# Patient Record
Sex: Male | Born: 1960 | Race: White | Hispanic: No | Smoking: Former smoker
Health system: Southern US, Community
[De-identification: ages and names within clinical notes are randomized; demographics above are authoritative.]

## PROBLEM LIST (undated history)

## (undated) DIAGNOSIS — K37 Unspecified appendicitis: Secondary | ICD-10-CM

## (undated) DIAGNOSIS — F419 Anxiety disorder, unspecified: Secondary | ICD-10-CM

## (undated) DIAGNOSIS — K219 Gastro-esophageal reflux disease without esophagitis: Secondary | ICD-10-CM

## (undated) DIAGNOSIS — G4700 Insomnia, unspecified: Secondary | ICD-10-CM

## (undated) DIAGNOSIS — J40 Bronchitis, not specified as acute or chronic: Secondary | ICD-10-CM

## (undated) DIAGNOSIS — K432 Incisional hernia without obstruction or gangrene: Secondary | ICD-10-CM

## (undated) HISTORY — PX: HERNIA REPAIR: SHX51

## (undated) HISTORY — DX: Insomnia, unspecified: G47.00

## (undated) HISTORY — DX: Unspecified appendicitis: K37

## (undated) HISTORY — DX: Incisional hernia without obstruction or gangrene: K43.2

---

## 2006-03-01 ENCOUNTER — Emergency Department (HOSPITAL_COMMUNITY): Admission: EM | Admit: 2006-03-01 | Discharge: 2006-03-01 | Payer: Self-pay | Admitting: Emergency Medicine

## 2006-05-09 ENCOUNTER — Encounter: Payer: Self-pay | Admitting: Pulmonary Disease

## 2007-08-20 ENCOUNTER — Ambulatory Visit: Payer: Self-pay | Admitting: Pulmonary Disease

## 2007-08-20 DIAGNOSIS — J45909 Unspecified asthma, uncomplicated: Secondary | ICD-10-CM | POA: Insufficient documentation

## 2007-08-20 DIAGNOSIS — F411 Generalized anxiety disorder: Secondary | ICD-10-CM

## 2007-08-20 DIAGNOSIS — R059 Cough, unspecified: Secondary | ICD-10-CM | POA: Insufficient documentation

## 2007-08-20 DIAGNOSIS — R0602 Shortness of breath: Secondary | ICD-10-CM

## 2007-08-20 DIAGNOSIS — K219 Gastro-esophageal reflux disease without esophagitis: Secondary | ICD-10-CM

## 2007-08-20 DIAGNOSIS — R05 Cough: Secondary | ICD-10-CM

## 2007-08-20 DIAGNOSIS — F419 Anxiety disorder, unspecified: Secondary | ICD-10-CM | POA: Insufficient documentation

## 2010-06-11 ENCOUNTER — Emergency Department (HOSPITAL_COMMUNITY)
Admission: EM | Admit: 2010-06-11 | Discharge: 2010-06-11 | Payer: Self-pay | Source: Home / Self Care | Admitting: Emergency Medicine

## 2013-04-14 ENCOUNTER — Encounter (HOSPITAL_COMMUNITY): Payer: Self-pay | Admitting: Emergency Medicine

## 2013-04-14 ENCOUNTER — Emergency Department (HOSPITAL_COMMUNITY): Payer: Medicare Other

## 2013-04-14 ENCOUNTER — Emergency Department (HOSPITAL_COMMUNITY)
Admission: EM | Admit: 2013-04-14 | Discharge: 2013-04-14 | Disposition: A | Discharge status: Home / Self Care | Payer: Medicare Other | Attending: Emergency Medicine | Admitting: Emergency Medicine

## 2013-04-14 DIAGNOSIS — M6208 Separation of muscle (nontraumatic), other site: Secondary | ICD-10-CM

## 2013-04-14 DIAGNOSIS — Z8719 Personal history of other diseases of the digestive system: Secondary | ICD-10-CM | POA: Insufficient documentation

## 2013-04-14 DIAGNOSIS — R197 Diarrhea, unspecified: Secondary | ICD-10-CM | POA: Insufficient documentation

## 2013-04-14 DIAGNOSIS — R062 Wheezing: Secondary | ICD-10-CM | POA: Insufficient documentation

## 2013-04-14 DIAGNOSIS — F10239 Alcohol dependence with withdrawal, unspecified: Secondary | ICD-10-CM | POA: Insufficient documentation

## 2013-04-14 DIAGNOSIS — Z79899 Other long term (current) drug therapy: Secondary | ICD-10-CM | POA: Insufficient documentation

## 2013-04-14 DIAGNOSIS — R112 Nausea with vomiting, unspecified: Secondary | ICD-10-CM

## 2013-04-14 DIAGNOSIS — F17200 Nicotine dependence, unspecified, uncomplicated: Secondary | ICD-10-CM | POA: Insufficient documentation

## 2013-04-14 DIAGNOSIS — F10939 Alcohol use, unspecified with withdrawal, unspecified: Secondary | ICD-10-CM

## 2013-04-14 DIAGNOSIS — F411 Generalized anxiety disorder: Secondary | ICD-10-CM | POA: Insufficient documentation

## 2013-04-14 DIAGNOSIS — Z8709 Personal history of other diseases of the respiratory system: Secondary | ICD-10-CM | POA: Insufficient documentation

## 2013-04-14 DIAGNOSIS — F419 Anxiety disorder, unspecified: Secondary | ICD-10-CM

## 2013-04-14 DIAGNOSIS — M6200 Separation of muscle (nontraumatic), unspecified site: Secondary | ICD-10-CM | POA: Insufficient documentation

## 2013-04-14 HISTORY — DX: Anxiety disorder, unspecified: F41.9

## 2013-04-14 HISTORY — DX: Gastro-esophageal reflux disease without esophagitis: K21.9

## 2013-04-14 HISTORY — DX: Bronchitis, not specified as acute or chronic: J40

## 2013-04-14 LAB — CBC
HCT: 43.9 % (ref 39.0–52.0)
Hemoglobin: 15 g/dL (ref 13.0–17.0)
MCH: 30.9 pg (ref 26.0–34.0)
MCHC: 34.2 g/dL (ref 30.0–36.0)
MCV: 90.3 fL (ref 78.0–100.0)
RBC: 4.86 MIL/uL (ref 4.22–5.81)

## 2013-04-14 LAB — LIPASE, BLOOD: Lipase: 32 U/L (ref 11–59)

## 2013-04-14 LAB — COMPREHENSIVE METABOLIC PANEL
ALT: 53 U/L (ref 0–53)
BUN: 9 mg/dL (ref 6–23)
CO2: 22 mEq/L (ref 19–32)
Calcium: 9.1 mg/dL (ref 8.4–10.5)
GFR calc Af Amer: >90 mL/min (ref >90)
GFR calc non Af Amer: >90 mL/min (ref >90)
Glucose, Bld: 110 mg/dL — ABNORMAL HIGH (ref 70–99)
Total Protein: 8.6 g/dL — ABNORMAL HIGH (ref 6.0–8.3)

## 2013-04-14 MED ORDER — LORAZEPAM 2 MG/ML IJ SOLN
1.0000 mg | Freq: Once | INTRAMUSCULAR | Status: AC
  Administered 2013-04-14: 1 mg via INTRAVENOUS

## 2013-04-14 MED ORDER — PANTOPRAZOLE SODIUM 40 MG IV SOLR
40.0000 mg | Freq: Once | INTRAVENOUS | Status: AC
  Administered 2013-04-14: 40 mg via INTRAVENOUS
  Filled 2013-04-14: qty 40

## 2013-04-14 MED ORDER — SODIUM CHLORIDE 0.9 % IV BOLUS (SEPSIS)
1000.0000 mL | Freq: Once | INTRAVENOUS | Status: AC
  Administered 2013-04-14: 1000 mL via INTRAVENOUS

## 2013-04-14 MED ORDER — ALBUTEROL SULFATE (2.5 MG/3ML) 0.083% IN NEBU: 5.0000 mg | INHALATION_SOLUTION | Freq: Four times a day (QID) | RESPIRATORY_TRACT | Status: DC | PRN

## 2013-04-14 MED ORDER — LORAZEPAM 2 MG/ML IJ SOLN
1.0000 mg | Freq: Once | INTRAMUSCULAR | Status: AC
  Administered 2013-04-14: 1 mg via INTRAVENOUS
  Filled 2013-04-14: qty 1

## 2013-04-14 MED ORDER — LORAZEPAM 1 MG PO TABS: 1.0000 mg | ORAL_TABLET | Freq: Three times a day (TID) | ORAL | Status: DC | PRN

## 2013-04-14 MED ORDER — ALBUTEROL SULFATE HFA 108 (90 BASE) MCG/ACT IN AERS: 2.0000 | INHALATION_SPRAY | RESPIRATORY_TRACT | Status: DC | PRN

## 2013-04-14 MED ORDER — ONDANSETRON HCL 8 MG PO TABS: 8.0000 mg | ORAL_TABLET | Freq: Three times a day (TID) | ORAL | Status: DC | PRN

## 2013-04-14 MED ORDER — ONDANSETRON HCL 4 MG/2ML IJ SOLN
4.0000 mg | Freq: Once | INTRAMUSCULAR | Status: AC
  Administered 2013-04-14: 4 mg via INTRAVENOUS

## 2013-04-14 MED ORDER — ALBUTEROL SULFATE (5 MG/ML) 0.5% IN NEBU
5.0000 mg | INHALATION_SOLUTION | Freq: Once | RESPIRATORY_TRACT | Status: AC
  Administered 2013-04-14: 5 mg via RESPIRATORY_TRACT
  Filled 2013-04-14: qty 1

## 2013-04-14 MED ORDER — MORPHINE SULFATE 4 MG/ML IJ SOLN
4.0000 mg | Freq: Once | INTRAMUSCULAR | Status: AC
  Administered 2013-04-14: 4 mg via INTRAVENOUS

## 2013-04-14 MED ORDER — ONDANSETRON HCL 4 MG/2ML IJ SOLN
INTRAMUSCULAR | Status: AC
  Filled 2013-04-14: qty 2

## 2013-04-14 MED ORDER — MORPHINE SULFATE 4 MG/ML IJ SOLN
INTRAMUSCULAR | Status: AC
  Filled 2013-04-14: qty 1

## 2013-04-14 MED ORDER — ONDANSETRON HCL 4 MG/2ML IJ SOLN
4.0000 mg | Freq: Once | INTRAMUSCULAR | Status: AC
  Administered 2013-04-14: 4 mg via INTRAVENOUS
  Filled 2013-04-14: qty 2

## 2013-04-14 MED ORDER — LORAZEPAM 2 MG/ML IJ SOLN
INTRAMUSCULAR | Status: AC
  Filled 2013-04-14: qty 1

## 2013-04-14 NOTE — ED Notes (Addendum)
Pt reports today with a "lump in his stomach." Pt reports that the lump gets bigger and then will recede. Pt reports nausea and diarrhea, however denies emesis. Pt is A/O x4 and in NAD. Pt also reports depression after the death of his granddaughter from SIDS, pt states he has been drinking excessively and would like assistance in reducing his alcohol intake.

## 2013-04-14 NOTE — ED Provider Notes (Signed)
CSN: [id number]     Arrival date & time 04/14/13  1538 History   First MD Initiated Contact with Patient 04/14/13 1612     Chief Complaint  Patient presents with  . Abdominal Pain   (Consider location/radiation/quality/duration/timing/severity/associated sxs/prior Treatment) Patient is a 52 y.o. male presenting with abdominal pain. The history is provided by the patient.  Abdominal Pain Associated symptoms: diarrhea and nausea   Associated symptoms: no chest pain, no fever and no shortness of breath   pt with hx etoh abuse, presents c/o nvd for the past few days. States last drank 3 days ago, was drinking 6-8 beers per night prior to that. Notes hx anxiety. Denies severe depression or thoughts of self harm. w etoh abuse, will feel shaky when stops, but denies hx seizures or dts diarrhea watery, few episodes per day. Nausea. No bloody or bilious emesis. No constant or focal abd pain. No fever or chills. Also notes for hx years, midline abd 'pooching out' when strains or coughs. Goes down when lies down or isnt straining. Never becomes stuck out or painful. No current abd distension or pain     Past Medical History  Diagnosis Date  . Bronchitis   . GERD (gastroesophageal reflux disease)   . Anxiety    History reviewed. No pertinent past surgical history. No family history on file. History  Substance Use Topics  . Smoking status: Current Every Day Smoker -- 0.50 packs/day for 25 years    Types: Cigarettes  . Smokeless tobacco: Not on file  . Alcohol Use: 36.0 oz/week    60 Cans of beer per week    Review of Systems  Constitutional: Negative for fever.  Eyes: Negative for visual disturbance.  Respiratory: Negative for shortness of breath.   Cardiovascular: Negative for chest pain.  Gastrointestinal: Positive for nausea and diarrhea. Negative for blood in stool.  Genitourinary: Negative for flank pain.  Musculoskeletal: Negative for back pain and neck pain.  Skin: Negative for  rash.  Neurological: Negative for headaches.  Hematological: Does not bruise/bleed easily.  Psychiatric/Behavioral: Negative for confusion.    Allergies  Review of patient's allergies indicates no known allergies.  Home Medications   Current Outpatient Rx  Name  Route  Sig  Dispense  Refill  . albuterol (PROVENTIL HFA;VENTOLIN HFA) 108 (90 BASE) MCG/ACT inhaler   Inhalation   Inhale 2 puffs into the lungs every 6 (six) hours as needed for wheezing (prn).          BP 167/97  Pulse 113  Temp(Src) 98.8 F (37.1 C) (Oral)  Resp 18  SpO2 98% Physical Exam  Nursing note and vitals reviewed. Constitutional: He is oriented to person, place, and time. He appears well-developed and well-nourished. No distress.  HENT:  Mouth/Throat: Oropharynx is clear and moist.  Eyes: Conjunctivae are normal. Pupils are equal, round, and reactive to light. No scleral icterus.  Neck: Neck supple. No tracheal deviation present.  Cardiovascular: Normal rate, regular rhythm, normal heart sounds and intact distal pulses.   Pulmonary/Chest: Effort normal. No accessory muscle usage. No respiratory distress. He has wheezes.  Abdominal: Soft. Bowel sounds are normal. He exhibits no distension and no mass. There is no tenderness. There is no rebound and no guarding.  ?rectus diastasis w coughing, bearing down. No incarcerated hernia.   Genitourinary:  No cva tenderness  Musculoskeletal: Normal range of motion. He exhibits no edema and no tenderness.  Neurological: He is alert and oriented to person, place, and time.  Skin: Skin is warm and dry.  Psychiatric:  Mildly anxious.     ED Course  Procedures (including critical care time)  Results for orders placed during the hospital encounter of 04/14/13  CBC      Result Value Range   WBC 7.5  4.0 - 10.5 K/uL   RBC 4.86  4.22 - 5.81 MIL/uL   Hemoglobin 15.0  13.0 - 17.0 g/dL   HCT 96.0  45.4 - 09.8 %   MCV 90.3  78.0 - 100.0 fL   MCH 30.9  26.0 - 34.0  pg   MCHC 34.2  30.0 - 36.0 g/dL   RDW 11.9  14.7 - 82.9 %   Platelets 167  150 - 400 K/uL  COMPREHENSIVE METABOLIC PANEL      Result Value Range   Sodium 132 (*) 135 - 145 mEq/L   Potassium 3.5  3.5 - 5.1 mEq/L   Chloride 97  96 - 112 mEq/L   CO2 22  19 - 32 mEq/L   Glucose, Bld 110 (*) 70 - 99 mg/dL   BUN 9  6 - 23 mg/dL   Creatinine, Ser 5.62  0.50 - 1.35 mg/dL   Calcium 9.1  8.4 - 13.0 mg/dL   Total Protein 8.6 (*) 6.0 - 8.3 g/dL   Albumin 4.0  3.5 - 5.2 g/dL   AST 865 (*) 0 - 37 U/L   ALT 53  0 - 53 U/L   Alkaline Phosphatase 104  39 - 117 U/L   Total Bilirubin 1.1  0.3 - 1.2 mg/dL   GFR calc non Af Amer >90  >90 mL/min   GFR calc Af Amer >90  >90 mL/min  LIPASE, BLOOD      Result Value Range   Lipase 32  11 - 59 U/L  ETHANOL      Result Value Range   Alcohol, Ethyl (B) <11  0 - 11 mg/dL   Dg Chest 2 View  78/46/9629   CLINICAL DATA:  Cough, shortness of breath, smoker.  EXAM: CHEST  2 VIEW  COMPARISON:  08/20/2007.  Chest CTA dated 01/31/2007.  FINDINGS: The heart remains normal in size and the lungs are clear. No significant change in diffuse peribronchial thickening and accentuation of the interstitial markings. Old, healed right rib fractures. A previously demonstrated right upper lobe nodule is better visualized today with overlapping ribs. This has not changed significantly in size since the previous CTA. Unremarkable bones.  IMPRESSION: 1. Stable chronic bronchitic changes. 2. Stable right upper lobe nodule. The long-term stability is compatible with a benign process. 3. No acute abnormality.   Electronically Signed   By: Gordan Payment M.D.   On: 04/14/2013 17:04      EKG Interpretation   None       MDM  Iv ns bolus. zofran iv. protonix iv.   Reviewed nursing notes and prior charts for additional history.   Ativan 1 mg iv.   2nd liter ns.   No recurrent nvd in ed.  Pt requests med for nerves. No tremor or shakes noted.  Ativan 1 mg iv.  Recheck pt  feels much improved.  Pt appears stable for d/c.        Suzi Roots, MD 04/14/13 [phone]

## 2016-05-28 DIAGNOSIS — J449 Chronic obstructive pulmonary disease, unspecified: Secondary | ICD-10-CM | POA: Diagnosis not present

## 2016-05-28 DIAGNOSIS — R7989 Other specified abnormal findings of blood chemistry: Secondary | ICD-10-CM | POA: Diagnosis not present

## 2016-05-28 DIAGNOSIS — R0781 Pleurodynia: Secondary | ICD-10-CM | POA: Diagnosis not present

## 2016-05-28 DIAGNOSIS — R1010 Upper abdominal pain, unspecified: Secondary | ICD-10-CM | POA: Diagnosis not present

## 2016-05-28 DIAGNOSIS — D649 Anemia, unspecified: Secondary | ICD-10-CM | POA: Diagnosis not present

## 2016-05-28 DIAGNOSIS — R945 Abnormal results of liver function studies: Secondary | ICD-10-CM | POA: Diagnosis not present

## 2016-05-28 DIAGNOSIS — R911 Solitary pulmonary nodule: Secondary | ICD-10-CM | POA: Diagnosis not present

## 2016-05-28 DIAGNOSIS — S2232XA Fracture of one rib, left side, initial encounter for closed fracture: Secondary | ICD-10-CM | POA: Diagnosis not present

## 2016-05-28 DIAGNOSIS — F1010 Alcohol abuse, uncomplicated: Secondary | ICD-10-CM | POA: Diagnosis not present

## 2016-05-28 DIAGNOSIS — R1012 Left upper quadrant pain: Secondary | ICD-10-CM | POA: Diagnosis not present

## 2016-05-28 DIAGNOSIS — S2231XD Fracture of one rib, right side, subsequent encounter for fracture with routine healing: Secondary | ICD-10-CM | POA: Diagnosis not present

## 2016-05-28 DIAGNOSIS — S3991XA Unspecified injury of abdomen, initial encounter: Secondary | ICD-10-CM | POA: Diagnosis not present

## 2016-05-28 DIAGNOSIS — F10129 Alcohol abuse with intoxication, unspecified: Secondary | ICD-10-CM | POA: Diagnosis not present

## 2016-05-28 DIAGNOSIS — K7460 Unspecified cirrhosis of liver: Secondary | ICD-10-CM | POA: Diagnosis not present

## 2016-05-28 DIAGNOSIS — K766 Portal hypertension: Secondary | ICD-10-CM | POA: Diagnosis not present

## 2016-05-28 DIAGNOSIS — R935 Abnormal findings on diagnostic imaging of other abdominal regions, including retroperitoneum: Secondary | ICD-10-CM | POA: Diagnosis not present

## 2016-05-28 DIAGNOSIS — Z79899 Other long term (current) drug therapy: Secondary | ICD-10-CM | POA: Diagnosis not present

## 2017-07-19 DIAGNOSIS — H3561 Retinal hemorrhage, right eye: Secondary | ICD-10-CM | POA: Diagnosis not present

## 2017-07-19 DIAGNOSIS — H04123 Dry eye syndrome of bilateral lacrimal glands: Secondary | ICD-10-CM | POA: Diagnosis not present

## 2017-07-19 DIAGNOSIS — H25813 Combined forms of age-related cataract, bilateral: Secondary | ICD-10-CM | POA: Diagnosis not present

## 2017-09-05 DIAGNOSIS — H2512 Age-related nuclear cataract, left eye: Secondary | ICD-10-CM | POA: Diagnosis not present

## 2017-09-18 DIAGNOSIS — H268 Other specified cataract: Secondary | ICD-10-CM | POA: Diagnosis not present

## 2017-09-18 DIAGNOSIS — H25812 Combined forms of age-related cataract, left eye: Secondary | ICD-10-CM | POA: Diagnosis not present

## 2017-09-18 DIAGNOSIS — H2512 Age-related nuclear cataract, left eye: Secondary | ICD-10-CM | POA: Diagnosis not present

## 2017-09-18 DIAGNOSIS — H2181 Floppy iris syndrome: Secondary | ICD-10-CM | POA: Diagnosis not present

## 2019-01-21 ENCOUNTER — Telehealth: Payer: Self-pay

## 2019-01-21 NOTE — Telephone Encounter (Signed)
Copied from [place] [id number]. Topic: Appointment Scheduling - Scheduling Inquiry for Clinic >> Jan 21, 2019  2:34 PM Sheran Luz wrote: Patient would like to know if Dr. Larose Kells would accept him as a new patient. His step father is a current patient of Dr. Larose Kells.

## 2019-01-22 NOTE — Telephone Encounter (Signed)
Okay to schedule NP appt.  

## 2019-01-22 NOTE — Telephone Encounter (Signed)
Ok to accept?

## 2019-01-22 NOTE — Telephone Encounter (Signed)
Done

## 2019-01-24 ENCOUNTER — Emergency Department (HOSPITAL_BASED_OUTPATIENT_CLINIC_OR_DEPARTMENT_OTHER): Payer: PPO

## 2019-01-24 ENCOUNTER — Encounter: Payer: Self-pay | Admitting: Internal Medicine

## 2019-01-24 ENCOUNTER — Ambulatory Visit (INDEPENDENT_AMBULATORY_CARE_PROVIDER_SITE_OTHER): Payer: PPO | Admitting: Internal Medicine

## 2019-01-24 ENCOUNTER — Inpatient Hospital Stay (HOSPITAL_BASED_OUTPATIENT_CLINIC_OR_DEPARTMENT_OTHER)
Admission: EM | Admit: 2019-01-24 | Discharge: 2019-01-28 | DRG: 378 | Disposition: A | Discharge status: Home / Self Care | Payer: PPO | Source: Ambulatory Visit | Attending: Internal Medicine | Admitting: Internal Medicine

## 2019-01-24 ENCOUNTER — Other Ambulatory Visit: Payer: Self-pay

## 2019-01-24 ENCOUNTER — Encounter (HOSPITAL_BASED_OUTPATIENT_CLINIC_OR_DEPARTMENT_OTHER): Payer: Self-pay | Admitting: *Deleted

## 2019-01-24 VITALS — BP 157/86 | HR 122 | Temp 99.6°F | Resp 16 | Ht 72.0 in | Wt 228.0 lb

## 2019-01-24 DIAGNOSIS — D123 Benign neoplasm of transverse colon: Secondary | ICD-10-CM | POA: Diagnosis not present

## 2019-01-24 DIAGNOSIS — F10239 Alcohol dependence with withdrawal, unspecified: Secondary | ICD-10-CM | POA: Diagnosis present

## 2019-01-24 DIAGNOSIS — I429 Cardiomyopathy, unspecified: Secondary | ICD-10-CM | POA: Diagnosis not present

## 2019-01-24 DIAGNOSIS — K7460 Unspecified cirrhosis of liver: Secondary | ICD-10-CM | POA: Diagnosis not present

## 2019-01-24 DIAGNOSIS — I8510 Secondary esophageal varices without bleeding: Secondary | ICD-10-CM | POA: Diagnosis not present

## 2019-01-24 DIAGNOSIS — F17200 Nicotine dependence, unspecified, uncomplicated: Secondary | ICD-10-CM | POA: Diagnosis present

## 2019-01-24 DIAGNOSIS — D649 Anemia, unspecified: Secondary | ICD-10-CM | POA: Diagnosis not present

## 2019-01-24 DIAGNOSIS — D125 Benign neoplasm of sigmoid colon: Secondary | ICD-10-CM | POA: Diagnosis not present

## 2019-01-24 DIAGNOSIS — Z20828 Contact with and (suspected) exposure to other viral communicable diseases: Secondary | ICD-10-CM | POA: Diagnosis present

## 2019-01-24 DIAGNOSIS — R0602 Shortness of breath: Secondary | ICD-10-CM

## 2019-01-24 DIAGNOSIS — F17210 Nicotine dependence, cigarettes, uncomplicated: Secondary | ICD-10-CM | POA: Diagnosis present

## 2019-01-24 DIAGNOSIS — B1920 Unspecified viral hepatitis C without hepatic coma: Secondary | ICD-10-CM | POA: Diagnosis present

## 2019-01-24 DIAGNOSIS — D500 Iron deficiency anemia secondary to blood loss (chronic): Secondary | ICD-10-CM | POA: Diagnosis present

## 2019-01-24 DIAGNOSIS — K766 Portal hypertension: Secondary | ICD-10-CM | POA: Diagnosis not present

## 2019-01-24 DIAGNOSIS — K2951 Unspecified chronic gastritis with bleeding: Principal | ICD-10-CM | POA: Diagnosis present

## 2019-01-24 DIAGNOSIS — K2970 Gastritis, unspecified, without bleeding: Secondary | ICD-10-CM | POA: Diagnosis not present

## 2019-01-24 DIAGNOSIS — K635 Polyp of colon: Secondary | ICD-10-CM | POA: Diagnosis present

## 2019-01-24 DIAGNOSIS — K7031 Alcoholic cirrhosis of liver with ascites: Secondary | ICD-10-CM

## 2019-01-24 DIAGNOSIS — D122 Benign neoplasm of ascending colon: Secondary | ICD-10-CM | POA: Diagnosis not present

## 2019-01-24 DIAGNOSIS — F1020 Alcohol dependence, uncomplicated: Secondary | ICD-10-CM | POA: Diagnosis present

## 2019-01-24 DIAGNOSIS — K3189 Other diseases of stomach and duodenum: Secondary | ICD-10-CM | POA: Diagnosis not present

## 2019-01-24 DIAGNOSIS — R188 Other ascites: Secondary | ICD-10-CM

## 2019-01-24 DIAGNOSIS — E871 Hypo-osmolality and hyponatremia: Secondary | ICD-10-CM | POA: Diagnosis not present

## 2019-01-24 DIAGNOSIS — K2950 Unspecified chronic gastritis without bleeding: Secondary | ICD-10-CM | POA: Diagnosis not present

## 2019-01-24 DIAGNOSIS — E876 Hypokalemia: Secondary | ICD-10-CM | POA: Diagnosis present

## 2019-01-24 DIAGNOSIS — F419 Anxiety disorder, unspecified: Secondary | ICD-10-CM | POA: Diagnosis present

## 2019-01-24 DIAGNOSIS — K641 Second degree hemorrhoids: Secondary | ICD-10-CM | POA: Diagnosis present

## 2019-01-24 DIAGNOSIS — D120 Benign neoplasm of cecum: Secondary | ICD-10-CM | POA: Diagnosis not present

## 2019-01-24 DIAGNOSIS — D509 Iron deficiency anemia, unspecified: Secondary | ICD-10-CM | POA: Diagnosis not present

## 2019-01-24 DIAGNOSIS — R918 Other nonspecific abnormal finding of lung field: Secondary | ICD-10-CM | POA: Diagnosis not present

## 2019-01-24 DIAGNOSIS — F1029 Alcohol dependence with unspecified alcohol-induced disorder: Secondary | ICD-10-CM | POA: Diagnosis not present

## 2019-01-24 LAB — COMPREHENSIVE METABOLIC PANEL
ALT: 11 U/L (ref 0–44)
AST: 37 U/L (ref 15–41)
Albumin: 3 g/dL — ABNORMAL LOW (ref 3.5–5.0)
Alkaline Phosphatase: 90 U/L (ref 38–126)
Anion gap: 13 (ref 5–15)
BUN: 7 mg/dL (ref 6–20)
CO2: 25 mmol/L (ref 22–32)
Calcium: 7.7 mg/dL — ABNORMAL LOW (ref 8.9–10.3)
Chloride: 93 mmol/L — ABNORMAL LOW (ref 98–111)
Creatinine, Ser: 0.62 mg/dL (ref 0.61–1.24)
GFR calc Af Amer: >60 mL/min (ref >60)
GFR calc non Af Amer: >60 mL/min (ref >60)
Glucose, Bld: 106 mg/dL — ABNORMAL HIGH (ref 70–99)
Potassium: 3 mmol/L — ABNORMAL LOW (ref 3.5–5.1)
Sodium: 131 mmol/L — ABNORMAL LOW (ref 135–145)
Total Bilirubin: 1.6 mg/dL — ABNORMAL HIGH (ref 0.3–1.2)
Total Protein: 7.6 g/dL (ref 6.5–8.1)

## 2019-01-24 LAB — CBC WITH DIFFERENTIAL/PLATELET
Abs Immature Granulocytes: 0.01 10*3/uL (ref 0.00–0.07)
Basophils Absolute: 0 10*3/uL (ref 0.0–0.1)
Basophils Relative: 1 %
Eosinophils Absolute: 0 10*3/uL (ref 0.0–0.5)
Eosinophils Relative: 1 %
HCT: 24 % — ABNORMAL LOW (ref 39.0–52.0)
Hemoglobin: 6.2 g/dL — CL (ref 13.0–17.0)
Immature Granulocytes: 0 %
Lymphocytes Relative: 20 %
Lymphs Abs: 0.8 10*3/uL (ref 0.7–4.0)
MCH: 17.4 pg — ABNORMAL LOW (ref 26.0–34.0)
MCHC: 25.8 g/dL — ABNORMAL LOW (ref 30.0–36.0)
MCV: 67.2 fL — ABNORMAL LOW (ref 80.0–100.0)
Monocytes Absolute: 0.6 10*3/uL (ref 0.1–1.0)
Monocytes Relative: 14 %
Neutro Abs: 2.5 10*3/uL (ref 1.7–7.7)
Neutrophils Relative %: 64 %
Platelets: 202 10*3/uL (ref 150–400)
RBC: 3.57 MIL/uL — ABNORMAL LOW (ref 4.22–5.81)
RDW: 19.4 % — ABNORMAL HIGH (ref 11.5–15.5)
WBC: 4 10*3/uL (ref 4.0–10.5)
nRBC: 0 % (ref 0.0–0.2)

## 2019-01-24 LAB — URINALYSIS, ROUTINE W REFLEX MICROSCOPIC
Glucose, UA: 100 mg/dL — AB
Hgb urine dipstick: NEGATIVE
Ketones, ur: 15 mg/dL — AB
Leukocytes,Ua: NEGATIVE
Nitrite: NEGATIVE
Protein, ur: 30 mg/dL — AB
Specific Gravity, Urine: 1.025 (ref 1.005–1.030)
pH: 6 (ref 5.0–8.0)

## 2019-01-24 LAB — RAPID URINE DRUG SCREEN, HOSP PERFORMED
Amphetamines: NOT DETECTED
Barbiturates: NOT DETECTED
Benzodiazepines: NOT DETECTED
Cocaine: NOT DETECTED
Opiates: NOT DETECTED
Tetrahydrocannabinol: NOT DETECTED

## 2019-01-24 LAB — ETHANOL: Alcohol, Ethyl (B): <10 mg/dL (ref <10)

## 2019-01-24 LAB — SARS CORONAVIRUS 2 BY RT PCR (HOSPITAL ORDER, PERFORMED IN ~~LOC~~ HOSPITAL LAB): SARS Coronavirus 2: NEGATIVE

## 2019-01-24 LAB — URINALYSIS, MICROSCOPIC (REFLEX)

## 2019-01-24 LAB — BRAIN NATRIURETIC PEPTIDE: B Natriuretic Peptide: 92.8 pg/mL (ref 0.0–100.0)

## 2019-01-24 LAB — LIPASE, BLOOD: Lipase: 35 U/L (ref 11–51)

## 2019-01-24 LAB — OCCULT BLOOD X 1 CARD TO LAB, STOOL: Fecal Occult Bld: NEGATIVE

## 2019-01-24 LAB — PROTIME-INR
INR: 1.3 — ABNORMAL HIGH (ref 0.8–1.2)
Prothrombin Time: 16.1 seconds — ABNORMAL HIGH (ref 11.4–15.2)

## 2019-01-24 LAB — AMMONIA: Ammonia: 15 umol/L (ref 9–35)

## 2019-01-24 MED ORDER — ALBUTEROL SULFATE HFA 108 (90 BASE) MCG/ACT IN AERS
2.0000 | INHALATION_SPRAY | RESPIRATORY_TRACT | Status: DC
  Administered 2019-01-24: 22:00:00 2 via RESPIRATORY_TRACT
  Filled 2019-01-24: qty 6.7

## 2019-01-24 MED ORDER — MORPHINE SULFATE (PF) 4 MG/ML IV SOLN
4.0000 mg | Freq: Once | INTRAVENOUS | Status: AC
  Administered 2019-01-24: 4 mg via INTRAVENOUS
  Filled 2019-01-24: qty 1

## 2019-01-24 MED ORDER — IOHEXOL 300 MG/ML  SOLN
100.0000 mL | Freq: Once | INTRAMUSCULAR | Status: AC
  Administered 2019-01-24: 100 mL via INTRAVENOUS

## 2019-01-24 MED ORDER — POTASSIUM CHLORIDE CRYS ER 20 MEQ PO TBCR
40.0000 meq | EXTENDED_RELEASE_TABLET | Freq: Once | ORAL | Status: AC
  Administered 2019-01-24: 40 meq via ORAL
  Filled 2019-01-24: qty 2

## 2019-01-24 NOTE — Patient Instructions (Signed)
Please proceed to the ER at the first floor.

## 2019-01-24 NOTE — Progress Notes (Signed)
Subjective:    Patient ID: William Whitaker, male    DOB: 02-22-1961, 58 y.o.   MRN: [id number]  DOS:  01/24/2019 Type of visit - description: New patient, acute visit. The patient is concerned because for the last 3 weeks, he has noted progressive lower extremity edema,bdominal swelling. He has shortness of breath but it is worse for the last month. He admits to heavy tobacco abuse, 3 pack a day, is only over the last year that he is smoking less. He also is a heavy drinker, he has "slowed down" lately and he only drinks 3 beers daily.   Review of Systems  Denies fever chills No chest pain No nausea, vomiting, diarrhea.  No blood in the stool. Occasional cough with whitish sputum, no hemoptysis.  Past Medical History:  Diagnosis Date  . Anxiety   . Bronchitis   . GERD (gastroesophageal reflux disease)   . Insomnia     Past Surgical History:  Procedure Laterality Date  . HERNIA REPAIR Right remotely as a baby    Social History   Socioeconomic History  . Marital status: Married    Spouse name: Not on file  . Number of children: Not on file  . Years of education: Not on file  . Highest education level: Not on file  Occupational History  . Occupation: disability d/t COPD  Social Needs  . Financial resource strain: Not on file  . Food insecurity    Worry: Not on file    Inability: Not on file  . Transportation needs    Medical: Not on file    Non-medical: Not on file  Tobacco Use  . Smoking status: Current Every Day Smoker    Packs/day: 0.50    Years: 25.00    Pack years: 12.50    Types: Cigarettes  . Smokeless tobacco: Never Used  . Tobacco comment: 3 PPD, started to devrease to 1/2 ppd 2019  Substance and Sexual Activity  . Alcohol use: Yes    Alcohol/week: 60.0 standard drinks    Types: 60 Cans of beer per week    Comment: 3-4 beers a day  . Drug use: No  . Sexual activity: Not on file  Lifestyle  . Physical activity    Days per week: Not on file   Minutes per session: Not on file  . Stress: Not on file  Relationships  . Social Herbalist on phone: Not on file    Gets together: Not on file    Attends religious service: Not on file    Active member of club or organization: Not on file    Attends meetings of clubs or organizations: Not on file    Relationship status: Not on file  . Intimate partner violence    Fear of current or ex partner: Not on file    Emotionally abused: Not on file    Physically abused: Not on file    Forced sexual activity: Not on file  Other Topics Concern  . Not on file  Social History Narrative  . Not on file     Family History  Problem Relation Age of Onset  . Muscular dystrophy Mother   . CAD Neg Hx   . Diabetes Neg Hx   . Cancer Neg Hx      Allergies as of 01/24/2019   No Known Allergies     Medication List       Accurate as of January 24, 2019  3:20 PM. If you have any questions, ask your nurse or doctor.        albuterol 108 (90 Base) MCG/ACT inhaler Commonly known as: VENTOLIN HFA Inhale 2 puffs into the lungs every 6 (six) hours as needed for wheezing (prn).   albuterol 108 (90 Base) MCG/ACT inhaler Commonly known as: VENTOLIN HFA Inhale 2 puffs into the lungs every 4 (four) hours as needed for wheezing.   albuterol (2.5 MG/3ML) 0.083% nebulizer solution Commonly known as: PROVENTIL Take 6 mLs (5 mg total) by nebulization every 6 (six) hours as needed for wheezing.   Aleve 220 MG Caps Generic drug: Naproxen Sodium Take 220 capsules by mouth 2 (two) times daily as needed (pain).   aspirin EC 81 MG tablet Take 81 mg by mouth every other day.   Calcium-Magnesium-Vitamin D 600-40-500 MG-MG-UNIT Tb24 Take 1 tablet by mouth daily.   hydroxypropyl methylcellulose / hypromellose 2.5 % ophthalmic solution Commonly known as: ISOPTO TEARS / GONIOVISC Place 1 drop into both eyes 3 (three) times daily as needed for dry eyes.   ibuprofen 200 MG tablet Commonly known as:  ADVIL Take 200 mg by mouth every 6 (six) hours as needed for moderate pain.   LORazepam 1 MG tablet Commonly known as: Ativan Take 1 tablet (1 mg total) by mouth 3 (three) times daily as needed for anxiety. No driving when taking   omeprazole 20 MG capsule Commonly known as: PRILOSEC Take 20 mg by mouth daily.   ondansetron 8 MG tablet Commonly known as: ZOFRAN Take 1 tablet (8 mg total) by mouth every 8 (eight) hours as needed for nausea.   One-A-Day Mens VitaCraves Chew Chew 1 tablet by mouth daily.   vitamin C 250 MG tablet Commonly known as: ASCORBIC ACID Take 250 mg by mouth daily.           Objective:   Physical Exam BP (!) 157/86 (BP Location: Left Arm, Patient Position: Sitting, Cuff Size: Small)   Pulse (!) 122   Temp 99.6 F (37.6 C) (Oral)   Resp 16   Ht 6' (1.829 m)   Wt 228 lb (103.4 kg)   SpO2 99%   BMI 30.92 kg/m  General:   Well developed, NAD, BMI noted.  HEENT:  Normocephalic . Face symmetric, atraumatic Conjunctiva: Pale, slightly icteric Lungs:  Decreased breath sounds at bases Normal respiratory effort, no intercostal retractions, no accessory muscle use. Heart: Tachycardic + Pretibial edema bilaterally, symmetric Abdomen:  ++ distended, soft, + ascites with shifting dullness Skin: No rash Neurologic:  alert & oriented X3.  Speech normal, gait appropriate for age and unassisted Psych--  Cognition and judgment appear intact.  Cooperative with normal attention span and concentration.  Behavior appropriate. No anxious or depressed appearing.     Assessment     ASSESSMENT (referred by [name])  COPD Anxiety GERD H/o ETOH and Heavy tobacco use (3ppd)  Plan: Cirrhosis of the liver with ascites New patient to me, presents with 3 weeks history of lower extremity edema, increased abdominal size,  worsening short of breath. On exam, he is pale, jaundiced, has ascites. On chart review, I see ER note from 2017 with multiple  diagnoses including liver cirrhosis, portal hypertension, anemia, etc. Suspect this gentleman will need prompt evaluation given anemia, tachycardia, ascites.   We will send him to the ER.  Suspect he will need an admission. If he does not ,  I will follow-up with him Monday. Case discussed with emergency room PA  who agreed to see the patient. Patient in agreement with my plan.  Today, I spent more than  30  min with the patient: >50% of the time counseling regards the acute onset of several symptoms, explaining him the need to refer him to the ER, reviewing the chart and coordinating his care.

## 2019-01-24 NOTE — Progress Notes (Signed)
Pre visit review using our clinic review tool, if applicable. No additional management support is needed unless otherwise documented below in the visit note. 

## 2019-01-24 NOTE — ED Provider Notes (Signed)
[facility] DEPARTMENT Provider Note   CSN: [id number] Arrival date & time: 01/24/19  1507     History   Chief Complaint No chief complaint on file.   HPI William Whitaker is a 58 y.o. male.     HPI Patient was referred to the emergency department by his PCP, Dr. Larose Kells.  He went in to be seen today because he is getting increasing swelling that started in his ankles and legs and worked its way up to his abdomen.  Patient reports that he has becoming increasingly short of breath.  He reports now he is very short of breath with minimal activity and has difficulty sleeping at night due to shortness of breath.  Reports he also coughs at night.  Patient denies he is had any fevers chills.  He reports he does have pain in his legs since they become swollen.  This is in both legs and feels like it is "in the bones".  He reports he has been very fatigued and generally weak and unable to do a lot.  He denies that he is had any vomiting.  He denies that he is had any black or tarry appearing stool.  He reports he is making urine and it seems to be a normal color.  Patient reports he used to be a fairly heavy consumer of alcohol.  He reports he is cut back quite a bit in the last number of months and now drinks couple of beers a day.  He also used to be very heavy smoker and now has cut back to just a few cigarettes a day. Past Medical History:  Diagnosis Date  . Anxiety   . Bronchitis   . GERD (gastroesophageal reflux disease)   . Insomnia     Patient Active Problem List   Diagnosis Date Noted  . ANXIETY 08/20/2007  . 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 G E R D 08/20/2007  . DYSPNEA 08/20/2007  . COUGH 08/20/2007    Past Surgical History:  Procedure Laterality Date  . HERNIA REPAIR Right remotely as a baby        Home Medications    Prior to Admission medications   Medication Sig Start Date End Date Taking? Authorizing Provider  albuterol (PROVENTIL HFA;VENTOLIN  HFA) 108 (90 BASE) MCG/ACT inhaler Inhale 2 puffs into the lungs every 6 (six) hours as needed for wheezing (prn).    [provider]  albuterol (PROVENTIL HFA;VENTOLIN HFA) 108 (90 BASE) MCG/ACT inhaler Inhale 2 puffs into the lungs every 4 (four) hours as needed for wheezing. Patient not taking: Reported on 01/24/2019 04/14/13   Lajean Saver, MD  albuterol (PROVENTIL) (2.5 MG/3ML) 0.083% nebulizer solution Take 6 mLs (5 mg total) by nebulization every 6 (six) hours as needed for wheezing. Patient not taking: Reported on 01/24/2019 04/14/13   Lajean Saver, MD  aspirin EC 81 MG tablet Take 81 mg by mouth daily.    [provider]  ibuprofen (ADVIL) 200 MG tablet Take 200 mg by mouth every 6 (six) hours as needed.    [provider]  LORazepam (ATIVAN) 1 MG tablet Take 1 tablet (1 mg total) by mouth 3 (three) times daily as needed for anxiety. No driving when taking Patient not taking: Reported on 01/24/2019 04/14/13   Lajean Saver, MD  ondansetron (ZOFRAN) 8 MG tablet Take 1 tablet (8 mg total) by mouth every 8 (eight) hours as needed for nausea. Patient not taking: Reported on 01/24/2019 04/14/13  Lajean Saver, MD    Family History Family History  Problem Relation Age of Onset  . CAD Neg Hx   . Diabetes Neg Hx   . Cancer Neg Hx     Social History Social History   Tobacco Use  . Smoking status: Current Every Day Smoker    Packs/day: 0.50    Years: 25.00    Pack years: 12.50    Types: Cigarettes  . Smokeless tobacco: Never Used  . Tobacco comment: 3 PPD, started to devrease to 1/2 ppd 2019  Substance Use Topics  . Alcohol use: Yes    Alcohol/week: 60.0 standard drinks    Types: 60 Cans of beer per week    Comment: 3-4 beers a day  . Drug use: No     Allergies   Patient has no known allergies.   Review of Systems Review of Systems 10 Systems reviewed and are negative for acute change except as noted in the HPI.   Physical Exam Updated Vital  Signs BP (!) 168/86 (BP Location: Left Arm)   Pulse (!) 119   Temp 99.1 F (37.3 C) (Oral)   Resp 20   Ht 6' (1.829 m)   Wt 103.4 kg   SpO2 96%   BMI 30.92 kg/m   Physical Exam Constitutional:      Comments: Patient is alert with clear mental status.  No confusion.  He is very pale in appearance.  No respiratory distress at rest.  Obviously protuberant abdomen.  HENT:     Head: Normocephalic and atraumatic.     Mouth/Throat:     Pharynx: Oropharynx is clear.  Eyes:     Extraocular Movements: Extraocular movements intact.  Cardiovascular:     Rate and Rhythm: Normal rate and regular rhythm.  Pulmonary:     Effort: Pulmonary effort is normal.     Breath sounds: Normal breath sounds.  Abdominal:     Comments: Abdomen is very protuberant with fluid wave.  Nontender.  Some pitting edema of the lower abdominal wall.  Genitourinary:    Comments: No stool in the vault.  Prostate enlarged. Musculoskeletal:     Comments: 2-3+ pitting edema bilateral lower extremities.  Warm and dry without wounds or erythema.  Neurological:     General: No focal deficit present.     Mental Status: He is oriented to person, place, and time.     Coordination: Coordination normal.  Psychiatric:        Mood and Affect: Mood normal.      ED Treatments / Results  Labs (all labs ordered are listed, but only abnormal results are displayed) Labs Reviewed - No data to display  EKG None  Radiology No results found.  Procedures Procedures (including critical care time)  Medications Ordered in ED Medications - No data to display   Initial Impression / Assessment and Plan / ED Course  I have reviewed the triage vital signs and the nursing notes.  Pertinent labs & imaging results that were available during my care of the patient were reviewed by me and considered in my medical decision making (see chart for details).       Patient presents with swelling that he describes as having really  advanced over the past week.  He reports he has become quite short of breath.  Patient is significantly anemic and has large abdominal ascites.  Abdomen is nontender and CT does not show other acute findings besides ascites.  At this time, plan to  admit for transfusion for symptomatic anemia and further diagnostic/therapeutic evaluation for large ascites.  Patient reportedly has history of cirrhosis, at this time LFTs are not elevated.   Final Clinical Impressions(s) / ED Diagnoses   Final diagnoses:  Symptomatic anemia  Shortness of breath  Other ascites    ED Discharge Orders    None       Charlesetta Shanks, MD 01/24/19 2045

## 2019-01-24 NOTE — ED Notes (Signed)
Pt given sprite and peanut butter crackers.

## 2019-01-24 NOTE — ED Triage Notes (Signed)
Abdominal swelling per pt. Ascites noted. His legs are swollen and painful.  Pt was told 2 years ago he has cirrhosis. He went to Dr Larose Kells today and was told to come here for evaluation.

## 2019-01-24 NOTE — ED Notes (Signed)
[facility] notified (Jaime) - patient ready for transport 

## 2019-01-24 NOTE — Progress Notes (Signed)
When patient is transferred they will go to Room 1424 at [facility]. Please call report to [facility] RN @ [phone].Thank you

## 2019-01-25 ENCOUNTER — Encounter (HOSPITAL_COMMUNITY): Payer: Self-pay | Admitting: Internal Medicine

## 2019-01-25 ENCOUNTER — Inpatient Hospital Stay (HOSPITAL_COMMUNITY): Payer: PPO

## 2019-01-25 DIAGNOSIS — F10239 Alcohol dependence with withdrawal, unspecified: Secondary | ICD-10-CM | POA: Diagnosis present

## 2019-01-25 DIAGNOSIS — K7031 Alcoholic cirrhosis of liver with ascites: Secondary | ICD-10-CM | POA: Diagnosis present

## 2019-01-25 DIAGNOSIS — K766 Portal hypertension: Secondary | ICD-10-CM | POA: Diagnosis present

## 2019-01-25 DIAGNOSIS — D649 Anemia, unspecified: Secondary | ICD-10-CM | POA: Diagnosis not present

## 2019-01-25 DIAGNOSIS — F17210 Nicotine dependence, cigarettes, uncomplicated: Secondary | ICD-10-CM | POA: Diagnosis present

## 2019-01-25 DIAGNOSIS — D509 Iron deficiency anemia, unspecified: Secondary | ICD-10-CM

## 2019-01-25 DIAGNOSIS — K635 Polyp of colon: Secondary | ICD-10-CM | POA: Diagnosis present

## 2019-01-25 DIAGNOSIS — E871 Hypo-osmolality and hyponatremia: Secondary | ICD-10-CM | POA: Diagnosis present

## 2019-01-25 DIAGNOSIS — F1029 Alcohol dependence with unspecified alcohol-induced disorder: Secondary | ICD-10-CM

## 2019-01-25 DIAGNOSIS — K2970 Gastritis, unspecified, without bleeding: Secondary | ICD-10-CM | POA: Diagnosis not present

## 2019-01-25 DIAGNOSIS — F17200 Nicotine dependence, unspecified, uncomplicated: Secondary | ICD-10-CM | POA: Diagnosis present

## 2019-01-25 DIAGNOSIS — K7460 Unspecified cirrhosis of liver: Secondary | ICD-10-CM | POA: Diagnosis present

## 2019-01-25 DIAGNOSIS — E876 Hypokalemia: Secondary | ICD-10-CM | POA: Diagnosis present

## 2019-01-25 DIAGNOSIS — K641 Second degree hemorrhoids: Secondary | ICD-10-CM | POA: Diagnosis present

## 2019-01-25 DIAGNOSIS — I429 Cardiomyopathy, unspecified: Secondary | ICD-10-CM

## 2019-01-25 DIAGNOSIS — F419 Anxiety disorder, unspecified: Secondary | ICD-10-CM | POA: Diagnosis present

## 2019-01-25 DIAGNOSIS — B1920 Unspecified viral hepatitis C without hepatic coma: Secondary | ICD-10-CM | POA: Diagnosis present

## 2019-01-25 DIAGNOSIS — Z20828 Contact with and (suspected) exposure to other viral communicable diseases: Secondary | ICD-10-CM | POA: Diagnosis present

## 2019-01-25 DIAGNOSIS — F1020 Alcohol dependence, uncomplicated: Secondary | ICD-10-CM | POA: Diagnosis present

## 2019-01-25 DIAGNOSIS — R0602 Shortness of breath: Secondary | ICD-10-CM | POA: Diagnosis present

## 2019-01-25 DIAGNOSIS — K3189 Other diseases of stomach and duodenum: Secondary | ICD-10-CM | POA: Diagnosis present

## 2019-01-25 DIAGNOSIS — K2951 Unspecified chronic gastritis with bleeding: Secondary | ICD-10-CM | POA: Diagnosis present

## 2019-01-25 DIAGNOSIS — I8510 Secondary esophageal varices without bleeding: Secondary | ICD-10-CM | POA: Diagnosis present

## 2019-01-25 DIAGNOSIS — D500 Iron deficiency anemia secondary to blood loss (chronic): Secondary | ICD-10-CM | POA: Diagnosis present

## 2019-01-25 LAB — COMPREHENSIVE METABOLIC PANEL
ALT: 12 U/L (ref 0–44)
AST: 38 U/L (ref 15–41)
Albumin: 3.2 g/dL — ABNORMAL LOW (ref 3.5–5.0)
Alkaline Phosphatase: 89 U/L (ref 38–126)
Anion gap: 10 (ref 5–15)
BUN: 7 mg/dL (ref 6–20)
CO2: 28 mmol/L (ref 22–32)
Calcium: 7.8 mg/dL — ABNORMAL LOW (ref 8.9–10.3)
Chloride: 95 mmol/L — ABNORMAL LOW (ref 98–111)
Creatinine, Ser: 0.62 mg/dL (ref 0.61–1.24)
GFR calc Af Amer: >60 mL/min (ref >60)
GFR calc non Af Amer: >60 mL/min (ref >60)
Glucose, Bld: 99 mg/dL (ref 70–99)
Potassium: 3.2 mmol/L — ABNORMAL LOW (ref 3.5–5.1)
Sodium: 133 mmol/L — ABNORMAL LOW (ref 135–145)
Total Bilirubin: 1.9 mg/dL — ABNORMAL HIGH (ref 0.3–1.2)
Total Protein: 8.1 g/dL (ref 6.5–8.1)

## 2019-01-25 LAB — CBC
HCT: 24.8 % — ABNORMAL LOW (ref 39.0–52.0)
Hemoglobin: 6.3 g/dL — CL (ref 13.0–17.0)
MCH: 17.4 pg — ABNORMAL LOW (ref 26.0–34.0)
MCHC: 25.4 g/dL — ABNORMAL LOW (ref 30.0–36.0)
MCV: 68.3 fL — ABNORMAL LOW (ref 80.0–100.0)
Platelets: 214 10*3/uL (ref 150–400)
RBC: 3.63 MIL/uL — ABNORMAL LOW (ref 4.22–5.81)
RDW: 19.9 % — ABNORMAL HIGH (ref 11.5–15.5)
WBC: 4.5 10*3/uL (ref 4.0–10.5)
nRBC: 0 % (ref 0.0–0.2)

## 2019-01-25 LAB — OSMOLALITY: Osmolality: 278 mOsm/kg (ref 275–295)

## 2019-01-25 LAB — ECHOCARDIOGRAM COMPLETE
Height: 72 in
Weight: 3606.4 oz

## 2019-01-25 LAB — FERRITIN: Ferritin: 5 ng/mL — ABNORMAL LOW (ref 24–336)

## 2019-01-25 LAB — BODY FLUID CELL COUNT WITH DIFFERENTIAL
Lymphs, Fluid: 32 %
Monocyte-Macrophage-Serous Fluid: 52 % (ref 50–90)
Neutrophil Count, Fluid: 16 % (ref 0–25)
Total Nucleated Cell Count, Fluid: 365 cu mm (ref 0–1000)

## 2019-01-25 LAB — VITAMIN B12: Vitamin B-12: 508 pg/mL (ref 180–914)

## 2019-01-25 LAB — IRON AND TIBC
Iron: 17 ug/dL — ABNORMAL LOW (ref 45–182)
Saturation Ratios: 4 % — ABNORMAL LOW (ref 17.9–39.5)
TIBC: 427 ug/dL (ref 250–450)
UIBC: 410 ug/dL

## 2019-01-25 LAB — HIV ANTIBODY (ROUTINE TESTING W REFLEX): HIV Screen 4th Generation wRfx: NONREACTIVE

## 2019-01-25 LAB — CORTISOL: Cortisol, Plasma: 11 ug/dL

## 2019-01-25 LAB — ALBUMIN, PLEURAL OR PERITONEAL FLUID: Albumin, Fluid: 1 g/dL

## 2019-01-25 LAB — PROTEIN, PLEURAL OR PERITONEAL FLUID: Total protein, fluid: <3 g/dL

## 2019-01-25 LAB — PREPARE RBC (CROSSMATCH)

## 2019-01-25 LAB — MAGNESIUM: Magnesium: 1.3 mg/dL — ABNORMAL LOW (ref 1.7–2.4)

## 2019-01-25 LAB — TSH: TSH: 3.002 u[IU]/mL (ref 0.350–4.500)

## 2019-01-25 MED ORDER — ALBUMIN HUMAN 25 % IV SOLN
50.0000 g | Freq: Once | INTRAVENOUS | Status: AC
  Administered 2019-01-25: 50 g via INTRAVENOUS
  Filled 2019-01-25: qty 200

## 2019-01-25 MED ORDER — POTASSIUM CHLORIDE CRYS ER 20 MEQ PO TBCR
40.0000 meq | EXTENDED_RELEASE_TABLET | Freq: Once | ORAL | Status: AC
  Administered 2019-01-25: 40 meq via ORAL
  Filled 2019-01-25: qty 2

## 2019-01-25 MED ORDER — SPIRONOLACTONE 25 MG PO TABS
50.0000 mg | ORAL_TABLET | Freq: Every day | ORAL | Status: DC
  Administered 2019-01-25 – 2019-01-28 (×4): 50 mg via ORAL
  Filled 2019-01-25 (×4): qty 2

## 2019-01-25 MED ORDER — SODIUM CHLORIDE 0.9 % IV SOLN
510.0000 mg | INTRAVENOUS | Status: DC
  Administered 2019-01-25: 510 mg via INTRAVENOUS
  Filled 2019-01-25: qty 17

## 2019-01-25 MED ORDER — CHLORDIAZEPOXIDE HCL 25 MG PO CAPS
50.0000 mg | ORAL_CAPSULE | Freq: Once | ORAL | Status: AC
  Administered 2019-01-25: 50 mg via ORAL
  Filled 2019-01-25: qty 2

## 2019-01-25 MED ORDER — SODIUM CHLORIDE 0.9% FLUSH: 3.0000 mL | INTRAVENOUS | Status: DC | PRN

## 2019-01-25 MED ORDER — LORAZEPAM 2 MG/ML IJ SOLN: 0.0000 mg | Freq: Two times a day (BID) | INTRAMUSCULAR | Status: DC

## 2019-01-25 MED ORDER — ALBUTEROL SULFATE HFA 108 (90 BASE) MCG/ACT IN AERS: 2.0000 | INHALATION_SPRAY | RESPIRATORY_TRACT | Status: DC | PRN

## 2019-01-25 MED ORDER — PANTOPRAZOLE SODIUM 40 MG IV SOLR
40.0000 mg | Freq: Two times a day (BID) | INTRAVENOUS | Status: DC
  Administered 2019-01-25 – 2019-01-28 (×7): 40 mg via INTRAVENOUS
  Filled 2019-01-25 (×7): qty 40

## 2019-01-25 MED ORDER — LIDOCAINE HCL 1 % IJ SOLN
INTRAMUSCULAR | Status: AC
  Filled 2019-01-25: qty 20

## 2019-01-25 MED ORDER — CHLORDIAZEPOXIDE HCL 25 MG PO CAPS
25.0000 mg | ORAL_CAPSULE | Freq: Once | ORAL | Status: AC
  Administered 2019-01-26: 25 mg via ORAL
  Filled 2019-01-25: qty 1

## 2019-01-25 MED ORDER — LORAZEPAM 2 MG/ML IJ SOLN
0.0000 mg | Freq: Four times a day (QID) | INTRAMUSCULAR | Status: AC
  Administered 2019-01-25: 4 mg via INTRAVENOUS
  Administered 2019-01-26 (×2): 2 mg via INTRAVENOUS
  Administered 2019-01-27: 1 mg via INTRAVENOUS
  Filled 2019-01-25: qty 1
  Filled 2019-01-25: qty 2
  Filled 2019-01-25 (×2): qty 1

## 2019-01-25 MED ORDER — MAGNESIUM SULFATE 2 GM/50ML IV SOLN
2.0000 g | Freq: Once | INTRAVENOUS | Status: AC
  Administered 2019-01-25: 2 g via INTRAVENOUS
  Filled 2019-01-25: qty 50

## 2019-01-25 MED ORDER — VITAMIN B-1 100 MG PO TABS
100.0000 mg | ORAL_TABLET | Freq: Every day | ORAL | Status: DC
  Administered 2019-01-25 – 2019-01-28 (×4): 100 mg via ORAL
  Filled 2019-01-25 (×4): qty 1

## 2019-01-25 MED ORDER — ALBUTEROL SULFATE HFA 108 (90 BASE) MCG/ACT IN AERS: 2.0000 | INHALATION_SPRAY | Freq: Four times a day (QID) | RESPIRATORY_TRACT | Status: DC

## 2019-01-25 MED ORDER — LORAZEPAM 2 MG/ML IJ SOLN
1.0000 mg | Freq: Four times a day (QID) | INTRAMUSCULAR | Status: AC | PRN
  Administered 2019-01-27: 1 mg via INTRAVENOUS

## 2019-01-25 MED ORDER — SODIUM CHLORIDE 0.9% FLUSH
3.0000 mL | Freq: Two times a day (BID) | INTRAVENOUS | Status: DC
  Administered 2019-01-25 – 2019-01-28 (×7): 3 mL via INTRAVENOUS

## 2019-01-25 MED ORDER — ONDANSETRON HCL 4 MG/2ML IJ SOLN: 4.0000 mg | Freq: Four times a day (QID) | INTRAMUSCULAR | Status: DC | PRN

## 2019-01-25 MED ORDER — LORAZEPAM 1 MG PO TABS: 1.0000 mg | ORAL_TABLET | Freq: Four times a day (QID) | ORAL | Status: AC | PRN

## 2019-01-25 MED ORDER — FUROSEMIDE 10 MG/ML IJ SOLN
20.0000 mg | Freq: Once | INTRAMUSCULAR | Status: AC
  Administered 2019-01-25: 20 mg via INTRAVENOUS
  Filled 2019-01-25: qty 2

## 2019-01-25 MED ORDER — TRAMADOL HCL 50 MG PO TABS
50.0000 mg | ORAL_TABLET | Freq: Four times a day (QID) | ORAL | Status: DC | PRN
  Administered 2019-01-27 (×2): 50 mg via ORAL
  Filled 2019-01-25 (×2): qty 1

## 2019-01-25 MED ORDER — ALBUTEROL SULFATE (2.5 MG/3ML) 0.083% IN NEBU
2.5000 mg | INHALATION_SOLUTION | Freq: Four times a day (QID) | RESPIRATORY_TRACT | Status: DC
  Filled 2019-01-25: qty 3

## 2019-01-25 MED ORDER — ALBUTEROL SULFATE (2.5 MG/3ML) 0.083% IN NEBU
2.5000 mg | INHALATION_SOLUTION | Freq: Two times a day (BID) | RESPIRATORY_TRACT | Status: DC
  Administered 2019-01-26 – 2019-01-27 (×3): 2.5 mg via RESPIRATORY_TRACT
  Filled 2019-01-25 (×3): qty 3

## 2019-01-25 MED ORDER — THIAMINE HCL 100 MG/ML IJ SOLN
100.0000 mg | Freq: Every day | INTRAMUSCULAR | Status: DC
  Filled 2019-01-25: qty 2

## 2019-01-25 MED ORDER — SODIUM CHLORIDE 0.9% IV SOLUTION
Freq: Once | INTRAVENOUS | Status: AC
  Administered 2019-01-25: 04:00:00 via INTRAVENOUS

## 2019-01-25 MED ORDER — DIPHENHYDRAMINE HCL 25 MG PO CAPS
25.0000 mg | ORAL_CAPSULE | Freq: Once | ORAL | Status: AC
  Administered 2019-01-25: 25 mg via ORAL
  Filled 2019-01-25: qty 1

## 2019-01-25 MED ORDER — ADULT MULTIVITAMIN W/MINERALS CH
1.0000 | ORAL_TABLET | Freq: Every day | ORAL | Status: DC
  Administered 2019-01-25 – 2019-01-28 (×4): 1 via ORAL
  Filled 2019-01-25 (×4): qty 1

## 2019-01-25 MED ORDER — ALBUTEROL SULFATE (2.5 MG/3ML) 0.083% IN NEBU: 2.5000 mg | INHALATION_SOLUTION | RESPIRATORY_TRACT | Status: DC | PRN

## 2019-01-25 MED ORDER — FUROSEMIDE 10 MG/ML IJ SOLN
20.0000 mg | Freq: Every day | INTRAMUSCULAR | Status: DC
  Administered 2019-01-25 – 2019-01-28 (×4): 20 mg via INTRAVENOUS
  Filled 2019-01-25 (×4): qty 2

## 2019-01-25 MED ORDER — ALBUTEROL SULFATE (2.5 MG/3ML) 0.083% IN NEBU
2.5000 mg | INHALATION_SOLUTION | Freq: Four times a day (QID) | RESPIRATORY_TRACT | Status: DC
  Administered 2019-01-25 (×2): 2.5 mg via RESPIRATORY_TRACT
  Filled 2019-01-25 (×2): qty 3

## 2019-01-25 MED ORDER — POTASSIUM CHLORIDE CRYS ER 20 MEQ PO TBCR
40.0000 meq | EXTENDED_RELEASE_TABLET | ORAL | Status: AC
  Administered 2019-01-25: 40 meq via ORAL
  Filled 2019-01-25: qty 2

## 2019-01-25 MED ORDER — FOLIC ACID 1 MG PO TABS
1.0000 mg | ORAL_TABLET | Freq: Every day | ORAL | Status: DC
  Administered 2019-01-25 – 2019-01-28 (×4): 1 mg via ORAL
  Filled 2019-01-25 (×4): qty 1

## 2019-01-25 MED ORDER — SODIUM CHLORIDE 0.9 % IV SOLN: 250.0000 mL | INTRAVENOUS | Status: DC | PRN

## 2019-01-25 NOTE — Consult Note (Signed)
Referring Provider: Dr. Jani Gravel Primary Care Physician:  Colon Branch, MD Primary Gastroenterologist:  Althia Forts   Reason for Consultation: IDA, cirrhosis, ascites   HPI: William Whitaker is a 58 y.o. male with a past medical history of anxiety, EtOH abuse, GERD and bronchitis. He developed SOB with new swelling to his abdomen and legs approximately 3 weeks ago. He was evaluated by his new PCP, Dr. Kathlene November on 01/24/2019. He was assessed to have significant ascites with lower extremity edema so he was sent directly to [facility] ED then transferred to [facility]. He denies having any known history of cirrhosis. History of EtOH abuse from the age [age]. He reduced his alcohol consumption for the past 5+ years. He now drinks 2 to 3 beers every night. No other alcohol intake. No drug use. He took Ibuprofen 244m one tab daily for the past 2 weeks due to having bilateral leg pain and swelling. He takes ASA 823mdaily. No family history of liver disease. He takes Omeprazole 2067mnce daily for 5+ years. No heartburn as long as he takes the Omeprazole. No dysphagia or stomach pain. He passes a normal solid stool once daily. No rectal bleeding or melena. No hematemesis. Never had an EGD or colonoscopy. No known family history of gastric or colon cancer. No history of cardiac disease. Prior history of bronchitis, no COPD. Smokes cigarettes 1/2 ppd.   ED course: Sodium 131.  Potassium 3.0.  Glucose 106.  BUN 7.  Creatinine 0.62.  Alk phos 90.  Albumin 3.0.  Lipase 35.  AST 37.  ALT 11.  Total bili 1.6.  BNP 92.8.  WBC 4.0.  Hemoglobin 6.2.  Hematocrit 24.0.  MCV 67.2.  Platelet 202.  Iron 17.  TIBC 427.  Ferritin 5.  Vitamin B12 508.  INR 1.3.  TSH 3.002.  SARS coronavirus 2 was negative.  Chest x-ray: Chronic bronchitic changes without infiltrate.  Abdominal/pelvic CT with contrast: Coarse contour of the liver, no solid liver abnormality identified.  No gallstones or gallbladder wall thickening.  The pancreas was  unremarkable without ductal dilatation.  Splenomegaly.  Appendix was not clearly visualized.  No evidence of bowel wall thickening or distention or inflammatory changes.  Large volume ascites present.  Fluid containing a right inguinal hernia.  Aortic atherosclerosis.  Past Medical History:  Diagnosis Date   Anxiety    Bronchitis    GERD (gastroesophageal reflux disease)    Insomnia     Past Surgical History:  Procedure Laterality Date   HERNIA REPAIR Right remotely as a baby    Prior to Admission medications   Medication Sig Start Date End Date Taking? Authorizing Provider  albuterol (PROVENTIL HFA;VENTOLIN HFA) 108 (90 BASE) MCG/ACT inhaler Inhale 2 puffs into the lungs every 6 (six) hours as needed for wheezing (prn).   Yes [provider]  aspirin EC 81 MG tablet Take 81 mg by mouth every other day.    Yes [provider]  Calcium-Magnesium-Vitamin D 600-40-500 MG-MG-UNIT TB24 Take 1 tablet by mouth daily.   Yes [provider]  hydroxypropyl methylcellulose / hypromellose (ISOPTO TEARS / GONIOVISC) 2.5 % ophthalmic solution Place 1 drop into both eyes 3 (three) times daily as needed for dry eyes.   Yes [provider]  ibuprofen (ADVIL) 200 MG tablet Take 200 mg by mouth every 6 (six) hours as needed for moderate pain.    Yes [provider]  Multiple Vitamins-Minerals (ONE-A-DAY MENS VITACRAVES) CHEW Chew 1 tablet  by mouth daily.   Yes [provider]  Naproxen Sodium (ALEVE) 220 MG CAPS Take 220 capsules by mouth 2 (two) times daily as needed (pain).   Yes [provider]  omeprazole (PRILOSEC) 20 MG capsule Take 20 mg by mouth daily.   Yes [provider]  vitamin C (ASCORBIC ACID) 250 MG tablet Take 250 mg by mouth daily.   Yes [provider]  albuterol (PROVENTIL HFA;VENTOLIN HFA) 108 (90 BASE) MCG/ACT inhaler Inhale 2 puffs into the lungs every 4 (four) hours as needed for wheezing. Patient  not taking: Reported on 01/24/2019 04/14/13   Lajean Saver, MD  albuterol (PROVENTIL) (2.5 MG/3ML) 0.083% nebulizer solution Take 6 mLs (5 mg total) by nebulization every 6 (six) hours as needed for wheezing. Patient not taking: Reported on 01/24/2019 04/14/13   Lajean Saver, MD  LORazepam (ATIVAN) 1 MG tablet Take 1 tablet (1 mg total) by mouth 3 (three) times daily as needed for anxiety. No driving when taking Patient not taking: Reported on 01/24/2019 04/14/13   Lajean Saver, MD  ondansetron (ZOFRAN) 8 MG tablet Take 1 tablet (8 mg total) by mouth every 8 (eight) hours as needed for nausea. Patient not taking: Reported on 01/24/2019 04/14/13   Lajean Saver, MD    Current Facility-Administered Medications  Medication Dose Route Frequency Provider Last Rate Last Dose   0.9 %  sodium chloride infusion  250 mL Intravenous PRN Jani Gravel, MD       albuterol (VENTOLIN HFA) 108 (90 Base) MCG/ACT inhaler 2 puff  2 puff Inhalation Q6H Jani Gravel, MD       albuterol (VENTOLIN HFA) 108 (90 Base) MCG/ACT inhaler 2 puff  2 puff Inhalation Q4H PRN Jani Gravel, MD       [START ON 01/26/2019] chlordiazePOXIDE (LIBRIUM) capsule 25 mg  25 mg Oral Once Jani Gravel, MD       folic acid (FOLVITE) tablet 1 mg  1 mg Oral Daily Jani Gravel, MD       furosemide (LASIX) injection 20 mg  20 mg Intravenous Daily Jani Gravel, MD       LORazepam (ATIVAN) injection 0-4 mg  0-4 mg Intravenous Q6H Jani Gravel, MD       Followed by   Derrill Memo ON 01/27/2019] LORazepam (ATIVAN) injection 0-4 mg  0-4 mg Intravenous Q12H Jani Gravel, MD       LORazepam (ATIVAN) tablet 1 mg  1 mg Oral Q6H PRN Jani Gravel, MD       Or   LORazepam (ATIVAN) injection 1 mg  1 mg Intravenous Q6H PRN Jani Gravel, MD       magnesium sulfate IVPB 2 g 50 mL  2 g Intravenous Once Kc, Maren Beach, MD       multivitamin with minerals tablet 1 tablet  1 tablet Oral Daily Jani Gravel, MD       ondansetron [facility]) injection 4 mg  4 mg Intravenous Q6H PRN Jani Gravel, MD       pantoprazole (PROTONIX) injection 40 mg  40 mg Intravenous Q12H Jani Gravel, MD       potassium chloride SA (K-DUR) CR tablet 40 mEq  40 mEq Oral Q3H Kc, Ramesh, MD       sodium chloride flush (NS) 0.9 % injection 3 mL  3 mL Intravenous Marjean Donna, MD   3 mL at 01/25/19 0337   sodium chloride flush (NS) 0.9 % injection 3 mL  3 mL Intravenous PRN Jani Gravel, MD  spironolactone (ALDACTONE) tablet 50 mg  50 mg Oral Daily Jani Gravel, MD       thiamine (VITAMIN B-1) tablet 100 mg  100 mg Oral Daily Jani Gravel, MD       Or   thiamine (B-1) injection 100 mg  100 mg Intravenous Daily Jani Gravel, MD       traMADol Veatrice Bourbon) tablet 50 mg  50 mg Oral Q6H PRN Jani Gravel, MD        Allergies as of 01/24/2019   (No Known Allergies)    Family History  Problem Relation Age of Onset   Muscular dystrophy Mother    CAD Neg Hx    Diabetes Neg Hx    Cancer Neg Hx     Social History   Socioeconomic History   Marital status: Married    Spouse name: Not on file   Number of children: Not on file   Years of education: Not on file   Highest education level: Not on file  Occupational History   Occupation: disability d/t COPD  Social Needs   Financial resource strain: Not on file   Food insecurity    Worry: Not on file    Inability: Not on file   Transportation needs    Medical: Not on file    Non-medical: Not on file  Tobacco Use   Smoking status: Current Every Day Smoker    Packs/day: 0.50    Years: 25.00    Pack years: 12.50    Types: Cigarettes   Smokeless tobacco: Never Used   Tobacco comment: 3 PPD, started to devrease to 1/2 ppd 2019  Substance and Sexual Activity   Alcohol use: Yes    Alcohol/week: 60.0 standard drinks    Types: 60 Cans of beer per week    Comment: 3-4 beers a day   Drug use: No   Sexual activity: Not on file  Lifestyle   Physical activity    Days per week: Not on file    Minutes per session: Not on file    Stress: Not on file  Relationships   Social connections    Talks on phone: Not on file    Gets together: Not on file    Attends religious service: Not on file    Active member of club or organization: Not on file    Attends meetings of clubs or organizations: Not on file    Relationship status: Not on file   Intimate partner violence    Fear of current or ex partner: Not on file    Emotionally abused: Not on file    Physically abused: Not on file    Forced sexual activity: Not on file  Other Topics Concern   Not on file  Social History Narrative   Not on file    Review of Systems: Gen: Denies fever, sweats or chills. No weight loss.  CV: Denies chest pain, palpitations or edema. Resp: + SOB, see HPI. Coughs white phlegm.   GI: See HPI.  GU : Urine is darker in color x 1 day. No dysuria.  MS:  +Bilateral leg pain. Derm: Denies rash, itchiness, skin lesions or unhealing ulcers. Psych: Denies depression, anxiety or memory loss. Heme: Denies bruising, bleeding. Neuro:  Denies headaches, dizziness or paresthesias. Endo:  Denies any problems with DM, thyroid or adrenal function.  Physical Exam: Vital signs in last 24 hours: Temp:  [98.8 F (37.1 C)-99.6 F (37.6 C)] 98.8 F (37.1 C) (07/25 0618) Pulse Rate:  [  97-122] 97 (07/25 0618) Resp:  [14-22] 18 (07/25 0618) BP: (122-168)/(66-86) 124/79 (07/25 0618) SpO2:  [95 %-100 %] 96 % (07/25 0618) Weight:  [102.2 kg-103.4 kg] 102.2 kg (07/25 0618)   General:   Alert,  well-developed, well-nourished, pleasant and cooperative in NAD. Head:  Normocephalic and atraumatic. Eyes:  Sclera clear, no icterus. Conjunctiva pink. Ears:  Normal auditory acuity. Nose:  No deformity, discharge or lesions. Mouth:  No deformity or lesions.   Neck:  Supple. Lungs:  Lungs with exp wheezes throughout, few crackles in the bases.  Heart: RRR, no murmur. Abdomen:  Grossly distended, tense with ascites, umbilical hernia, nontender, + BS x 4  quads. Rectal:  Deferred  Msk:  Symmetrical without gross deformities. . Pulses:  Normal pulses noted. Extremities: Bilateral LEs with 2+ pitting edema Neurologic:  Alert and  oriented x3;  No asterixis. Skin:  Intact without significant lesions or rashes.No jaundice. Psych:  Alert and cooperative. Normal mood and affect.  Intake/Output from previous day: No intake/output data recorded. Intake/Output this shift: No intake/output data recorded.  Lab Results: Recent Labs    01/24/19 1627 01/25/19 0544  WBC 4.0 4.5  HGB 6.2* 6.3*  HCT 24.0* 24.8*  PLT 202 214   BMET Recent Labs    01/24/19 1627 01/25/19 0544  NA 131* 133*  K 3.0* 3.2*  CL 93* 95*  CO2 25 28  GLUCOSE 106* 99  BUN 7 7  CREATININE 0.62 0.62  CALCIUM 7.7* 7.8*   LFT Recent Labs    01/25/19 0544  PROT 8.1  ALBUMIN 3.2*  AST 38  ALT 12  ALKPHOS 89  BILITOT 1.9*   PT/INR Recent Labs    01/24/19 1627  LABPROT 16.1*  INR 1.3*   Hepatitis Panel No results for input(s): HEPBSAG, HCVAB, HEPAIGM, HEPBIGM in the last 72 hours.    Studies/Results: Dg Chest 2 View  Result Date: 01/24/2019 CLINICAL DATA:  Shortness of breath EXAM: CHEST - 2 VIEW COMPARISON:  05/28/2016 Correlation: CT chest 05/28/2016, 01/31/2007 FINDINGS: Normal heart size, mediastinal contours, and pulmonary vascularity. Chronic nodular density RIGHT upper lobe 11 x 9 mm, previously 14 x 11 mm. This corresponds to a noncalcified 10 x 9 mm diameter nodule on the most recent prior CT, unchanged since 01/31/2007, indicative of benign behavior. Bronchitic changes without pulmonary infiltrate, pleural effusion or pneumothorax. No acute osseous findings. IMPRESSION: Chronic bronchitic changes without infiltrate. Chronic RIGHT upper lobe nodule grossly stable since CT exam of 01/31/2007. Electronically Signed   By: Lavonia Dana M.D.   On: 01/24/2019 17:00   Ct Abdomen Pelvis W Contrast  Result Date: 01/24/2019 CLINICAL DATA:  Cirrhosis,  abdominal swelling, ascites EXAM: CT ABDOMEN AND PELVIS WITH CONTRAST TECHNIQUE: Multidetector CT imaging of the abdomen and pelvis was performed using the standard protocol following bolus administration of intravenous contrast. CONTRAST:  165m OMNIPAQUE IOHEXOL 300 MG/ML  SOLN COMPARISON:  None. FINDINGS: Lower chest: No acute abnormality. Hepatobiliary: No solid liver abnormality is seen. Coarse contour of the liver. No gallstones, gallbladder wall thickening, or biliary dilatation. Pancreas: Unremarkable. No pancreatic ductal dilatation or surrounding inflammatory changes. Spleen: Splenomegaly, maximum coronal span 17.3 cm Adrenals/Urinary Tract: Adrenal glands are unremarkable. Kidneys are normal, without renal calculi, solid lesion, or hydronephrosis. Bladder is unremarkable. Stomach/Bowel: Stomach is within normal limits. Appendix is not clearly visualized. No evidence of bowel wall thickening, distention, or inflammatory changes. Vascular/Lymphatic: Aortic atherosclerosis. No enlarged abdominal or pelvic lymph nodes. Reproductive: No mass or other significant abnormality. Other:  Fluid containing right inguinal hernia. Large volume ascites. Musculoskeletal: No acute or significant osseous findings. IMPRESSION: 1.  Stigmata of cirrhosis, splenomegaly, and large volume ascites. 2.  Aortic atherosclerosis. Electronically Signed   By: Eddie Candle M.D.   On: 01/24/2019 20:15    IMPRESSION/PLAN:  1. 58 y.o. male with history of EtOH abuse presents with SOB, ascites and LE edema. CTAP showed stigmata of cirrhosis, splenomegaly and large volume ascites. PLT 214. INR 1.3. BUN 7. Cr. 0.62.  -? low dose diuretics with hyponatremia -diagnostic paracentesis ordered, please check cell count with diff, C &S, gram stain, cytology, albumin and protein level. Requires IV albumin post paracentesis -agree with ECHO -monitor for alcohol withdrawal -Pantoprazole 54m IV bid -check Hep B and Hep C serologies if not  already done  2. Iron deficiency Anemia. Hg 6.3. Hct 24.8 (Hg 15 04/2013). MCV 68.3.BUN  Iron 17.  TIBC 427. Ferritin 5. Vitamin B12 508. Two units PRBCs ordered. -will need EGD and colonoscopy to assess for EV, upper and lower GI  Bleeding although FOBT 7/24 was negative, r/o upper/lower GI malignancy. Most likely will schedule EGD and colonoscopy on Monday 7/27. -repeat H/H post  Transfusion -recommend Feraheme after PRBC infusions completed  3. Hypokalemia -replacement per hospitalist   Further recommendations per Dr. PMaurine MinisterMDorathy Daft 01/25/2019, 11:31 AM

## 2019-01-25 NOTE — ED Notes (Signed)
Altha Harm was updated on patient transport status

## 2019-01-25 NOTE — H&P (Signed)
TRH H&P    Patient Demographics:    William Whitaker, is a 58 y.o. male  MRN: [id number]  DOB - [birth year]  Admit Date - 01/24/2019  Referring MD/NP/PA:  Charlesetta Shanks  Outpatient Primary MD for the patient is Colon Branch, MD  Patient coming from: home  Chief complaint-  Abdominal swelling, and edema, and dyspnea   HPI:    William Whitaker  is a 58 y.o. male,  w Anxiety , ETOH abuse, Jerrye Bushy, no prior colonoscopy, no family hx of liver disease, apparently presents with c/o abdominal swelling and lower extremity edema for several months.  Pt also notes fatigue as well as lack of energy and dyspnea on exertion. Pt was seen by new PCP today and sent to ER for evaluation.   In ED,   T 99.1  P 119  R 20  Bp 168/86  Pox 96% on RA Wt 103.4kg  CT scan abd/ pelvis IMPRESSION: 1.  Stigmata of cirrhosis, splenomegaly, and large volume ascites.  CXR IMPRESSION: Chronic bronchitic changes without infiltrate. Chronic RIGHT upper lobe nodule grossly stable since CT exam of 01/31/2007.  Na 131, K 3.0 Bun 7, Creatinine 0.62  Ast 37, Alt 11 Alk phos 90, T. Bili 1.6 Lipase 35 INR 1.3 Ammonia 15 ETOH <10 UDS negative FOBT negative  Wbc 4.0, hgb 6.2, Plt 202 Mcv 67.2, rdw 19.4  Urinalysis prot 30, wbc 6-10, rbc 0-5  BNP  92.8  Pt given lasix 40m iv x1, and benadryl 222miv x1 in the ED  ED requests admission for symptomatic anemia,  Ascites, and edema.    Review of systems:    In addition to the HPI above,  No Fever-chills, No Headache, No changes with Vision or hearing, No problems swallowing food or Liquids, No Chest pain, No Cough  No Abdominal pain, No Nausea or Vomiting, bowel movements are regular, No Blood in stool or Urine, No dysuria, No new skin rashes or bruises, No new joints pains-aches,  No new weakness, tingling, numbness in any extremity, No recent weight gain or loss, No polyuria,  polydypsia or polyphagia, No significant Mental Stressors.  All other systems reviewed and are negative.    Past History of the following :    Past Medical History:  Diagnosis Date   Anxiety    Bronchitis    GERD (gastroesophageal reflux disease)    Insomnia       Past Surgical History:  Procedure Laterality Date   HERNIA REPAIR Right remotely as a baby      Social History:      Social History   Tobacco Use   Smoking status: Current Every Day Smoker    Packs/day: 0.50    Years: 25.00    Pack years: 12.50    Types: Cigarettes   Smokeless tobacco: Never Used   Tobacco comment: 3 PPD, started to devrease to 1/2 ppd 2019  Substance Use Topics   Alcohol use: Yes    Alcohol/week: 60.0 standard drinks    Types: 60 Cans of beer per  week    Comment: 3-4 beers a day       Family History :     Family History  Problem Relation Age of Onset   Muscular dystrophy Mother    CAD Neg Hx    Diabetes Neg Hx    Cancer Neg Hx        Home Medications:   Prior to Admission medications   Medication Sig Start Date End Date Taking? Authorizing Provider  albuterol (PROVENTIL HFA;VENTOLIN HFA) 108 (90 BASE) MCG/ACT inhaler Inhale 2 puffs into the lungs every 6 (six) hours as needed for wheezing (prn).   Yes [provider]  aspirin EC 81 MG tablet Take 81 mg by mouth every other day.    Yes [provider]  Calcium-Magnesium-Vitamin D 600-40-500 MG-MG-UNIT TB24 Take 1 tablet by mouth daily.   Yes [provider]  hydroxypropyl methylcellulose / hypromellose (ISOPTO TEARS / GONIOVISC) 2.5 % ophthalmic solution Place 1 drop into both eyes 3 (three) times daily as needed for dry eyes.   Yes [provider]  ibuprofen (ADVIL) 200 MG tablet Take 200 mg by mouth every 6 (six) hours as needed for moderate pain.    Yes [provider]  Multiple Vitamins-Minerals (ONE-A-DAY MENS VITACRAVES) CHEW Chew 1 tablet by mouth daily.   Yes  [provider]  Naproxen Sodium (ALEVE) 220 MG CAPS Take 220 capsules by mouth 2 (two) times daily as needed (pain).   Yes [provider]  omeprazole (PRILOSEC) 20 MG capsule Take 20 mg by mouth daily.   Yes [provider]  vitamin C (ASCORBIC ACID) 250 MG tablet Take 250 mg by mouth daily.   Yes [provider]  albuterol (PROVENTIL HFA;VENTOLIN HFA) 108 (90 BASE) MCG/ACT inhaler Inhale 2 puffs into the lungs every 4 (four) hours as needed for wheezing. Patient not taking: Reported on 01/24/2019 04/14/13   Lajean Saver, MD  albuterol (PROVENTIL) (2.5 MG/3ML) 0.083% nebulizer solution Take 6 mLs (5 mg total) by nebulization every 6 (six) hours as needed for wheezing. Patient not taking: Reported on 01/24/2019 04/14/13   Lajean Saver, MD  LORazepam (ATIVAN) 1 MG tablet Take 1 tablet (1 mg total) by mouth 3 (three) times daily as needed for anxiety. No driving when taking Patient not taking: Reported on 01/24/2019 04/14/13   Lajean Saver, MD  ondansetron (ZOFRAN) 8 MG tablet Take 1 tablet (8 mg total) by mouth every 8 (eight) hours as needed for nausea. Patient not taking: Reported on 01/24/2019 04/14/13   Lajean Saver, MD     Allergies:    No Known Allergies   Physical Exam:   Vitals  Blood pressure (!) 146/81, pulse (!) 105, temperature 98.9 F (37.2 C), temperature source Oral, resp. rate (!) 22, height 6' (1.829 m), weight 103.4 kg, SpO2 100 %.  1.  General: axoxo3  2. Psychiatric: euthymic  3. Neurologic: cn2-12 intact, reflexes 2+ symmetric, diffuse with no clonus, motor 5/5 in all 4 ext,   4. HEENMT:  Anicteric, pupils 1.7m symmetric, direct, consensual, near intact  5. Respiratory : CTAB  6. Cardiovascular : rrr s1, s2,   7. Gastrointestinal:  ABd: soft, Markedly distended, nt, +bs, + HSM  8. Skin:  Ext: no c/c,  1+ edema,    9.Musculoskeletal:  Good ROM  No adenopathy    Data Review:    CBC Recent Labs  Lab  01/24/19 1627  WBC 4.0  HGB 6.2*  HCT 24.0*  PLT 202  MCV  67.2*  MCH 17.4*  MCHC 25.8*  RDW 19.4*  LYMPHSABS 0.8  MONOABS 0.6  EOSABS 0.0  BASOSABS 0.0   ------------------------------------------------------------------------------------------------------------------  Results for orders placed or performed during the hospital encounter of 01/24/19 (from the past 48 hour(s))  Comprehensive metabolic panel     Status: Abnormal   Collection Time: 01/24/19  4:27 PM  Result Value Ref Range   Sodium 131 (L) 135 - 145 mmol/L   Potassium 3.0 (L) 3.5 - 5.1 mmol/L   Chloride 93 (L) 98 - 111 mmol/L   CO2 25 22 - 32 mmol/L   Glucose, Bld 106 (H) 70 - 99 mg/dL   BUN 7 6 - 20 mg/dL   Creatinine, Ser 0.62 0.61 - 1.24 mg/dL   Calcium 7.7 (L) 8.9 - 10.3 mg/dL   Total Protein 7.6 6.5 - 8.1 g/dL   Albumin 3.0 (L) 3.5 - 5.0 g/dL   AST 37 15 - 41 U/L   ALT 11 0 - 44 U/L   Alkaline Phosphatase 90 38 - 126 U/L   Total Bilirubin 1.6 (H) 0.3 - 1.2 mg/dL   GFR calc non Af Amer >60 >60 mL/min   GFR calc Af Amer >60 >60 mL/min   Anion gap 13 5 - 15    Comment: Performed at [facility], [address] [place], [place] [address]  Lipase, blood     Status: None   Collection Time: 01/24/19  4:27 PM  Result Value Ref Range   Lipase 35 11 - 51 U/L    Comment: Performed at [facility], [address] [place], [place] [address]  Brain natriuretic peptide     Status: None   Collection Time: 01/24/19  4:27 PM  Result Value Ref Range   B Natriuretic Peptide 92.8 0.0 - 100.0 pg/mL    Comment: Performed at [facility], [address] [place], [place] [address]  CBC with Differential     Status: Abnormal   Collection Time: 01/24/19  4:27 PM  Result Value Ref Range   WBC 4.0 4.0 - 10.5 K/uL   RBC 3.57 (L) 4.22 - 5.81 MIL/uL   Hemoglobin 6.2 (LL) 13.0 - 17.0 g/dL    Comment: REPEATED TO VERIFY Reticulocyte Hemoglobin testing may be clinically indicated, consider  ordering this additional test [id number] THIS CRITICAL RESULT HAS VERIFIED AND BEEN CALLED TO SIMMS M RN BY CAROL PHILLIPS ON 07 24 2020 AT 1745, AND HAS BEEN READ BACK.     HCT 24.0 (L) 39.0 - 52.0 %   MCV 67.2 (L) 80.0 - 100.0 fL   MCH 17.4 (L) 26.0 - 34.0 pg   MCHC 25.8 (L) 30.0 - 36.0 g/dL   RDW 19.4 (H) 11.5 - 15.5 %   Platelets 202 150 - 400 K/uL    Comment: GIANT PLATELETS SEEN   nRBC 0.0 0.0 - 0.2 %   Neutrophils Relative % 64 %   Neutro Abs 2.5 1.7 - 7.7 K/uL   Lymphocytes Relative 20 %   Lymphs Abs 0.8 0.7 - 4.0 K/uL   Monocytes Relative 14 %   Monocytes Absolute 0.6 0.1 - 1.0 K/uL   Eosinophils Relative 1 %   Eosinophils Absolute 0.0 0.0 - 0.5 K/uL   Basophils Relative 1 %   Basophils Absolute 0.0 0.0 - 0.1 K/uL   WBC Morphology MORPHOLOGY UNREMARKABLE    RBC Morphology TEARDROP CELLS    Immature Granulocytes 0 %   Abs Immature  Granulocytes 0.01 0.00 - 0.07 K/uL    Comment: Performed at [facility], [address] [place], [place] [address]  Protime-INR     Status: Abnormal   Collection Time: 01/24/19  4:27 PM  Result Value Ref Range   Prothrombin Time 16.1 (H) 11.4 - 15.2 seconds   INR 1.3 (H) 0.8 - 1.2    Comment: (NOTE) INR goal varies based on device and disease states. Performed at [facility], [address] [place], [place] [address]   Urinalysis, Routine w reflex microscopic     Status: Abnormal   Collection Time: 01/24/19  4:27 PM  Result Value Ref Range   Color, Urine BROWN (A) YELLOW    Comment: BIOCHEMICALS MAY BE AFFECTED BY COLOR   APPearance TURBID (A) CLEAR   Specific Gravity, Urine 1.025 1.005 - 1.030   pH 6.0 5.0 - 8.0   Glucose, UA 100 (A) NEGATIVE mg/dL   Hgb urine dipstick NEGATIVE NEGATIVE   Bilirubin Urine MODERATE (A) NEGATIVE   Ketones, ur 15 (A) NEGATIVE mg/dL   Protein, ur 30 (A) NEGATIVE mg/dL   Nitrite NEGATIVE NEGATIVE   Leukocytes,Ua NEGATIVE NEGATIVE    Comment: Performed at [facility],  [address] [place], [place] [address]  Urinalysis, Microscopic (reflex)     Status: Abnormal   Collection Time: 01/24/19  4:27 PM  Result Value Ref Range   RBC / HPF 0-5 0 - 5 RBC/hpf   WBC, UA 6-10 0 - 5 WBC/hpf   Bacteria, UA RARE (A) NONE SEEN   Squamous Epithelial / LPF 6-10 0 - 5   Hyaline Casts, UA PRESENT     Comment: Performed at [facility], [address] [place], [place] [address]  Ammonia     Status: None   Collection Time: 01/24/19  4:57 PM  Result Value Ref Range   Ammonia 15 9 - 35 umol/L    Comment: Performed at [facility], [address] [place], [place] [address]  Ethanol     Status: None   Collection Time: 01/24/19  4:57 PM  Result Value Ref Range   Alcohol, Ethyl (B) <10 <10 mg/dL    Comment:        LOWEST DETECTABLE LIMIT FOR SERUM ALCOHOL IS 10 mg/dL FOR MEDICAL PURPOSES ONLY (NOTE) Lowest detectable limit for serum alcohol is 10 mg/dL. For medical purposes only. Performed at [facility], [address] [place], [place] [address]   Urine rapid drug screen (hosp performed)     Status: None   Collection Time: 01/24/19  5:45 PM  Result Value Ref Range   Opiates NONE DETECTED NONE DETECTED   Cocaine NONE DETECTED NONE DETECTED   Benzodiazepines NONE DETECTED NONE DETECTED   Amphetamines NONE DETECTED NONE DETECTED   Tetrahydrocannabinol NONE DETECTED NONE DETECTED   Barbiturates NONE DETECTED NONE DETECTED    Comment: (NOTE) DRUG SCREEN FOR MEDICAL PURPOSES ONLY.  IF CONFIRMATION IS NEEDED FOR ANY PURPOSE, NOTIFY LAB WITHIN 5 DAYS. LOWEST DETECTABLE LIMITS FOR URINE DRUG SCREEN Drug Class                     Cutoff (ng/mL) Amphetamine and metabolites    1000 Barbiturate and metabolites    200 Benzodiazepine                 542 Tricyclics and metabolites     300  Opiates and metabolites        300 Cocaine and metabolites        300 THC                            50 Performed at [facility],  [address] [place], [place] [address]   Occult blood card to lab, stool Provider will collect     Status: None   Collection Time: 01/24/19  6:45 PM  Result Value Ref Range   Fecal Occult Bld NEGATIVE NEGATIVE    Comment: Performed at [facility], [address] [place], [place] [address]  SARS Coronavirus 2 (Performed in [facility] hospital lab)     Status: None   Collection Time: 01/24/19  9:36 PM   Specimen: Nasopharyngeal Swab  Result Value Ref Range   SARS Coronavirus 2 NEGATIVE NEGATIVE    Comment: (NOTE) If result is NEGATIVE SARS-CoV-2 target nucleic acids are NOT DETECTED. The SARS-CoV-2 RNA is generally detectable in upper and lower  respiratory specimens during the acute phase of infection. The lowest  concentration of SARS-CoV-2 viral copies this assay can detect is 250  copies / mL. A negative result does not preclude SARS-CoV-2 infection  and should not be used as the sole basis for treatment or other  patient management decisions.  A negative result may occur with  improper specimen collection / handling, submission of specimen other  than nasopharyngeal swab, presence of viral mutation(s) within the  areas targeted by this assay, and inadequate number of viral copies  (<250 copies / mL). A negative result must be combined with clinical  observations, patient history, and epidemiological information. If result is POSITIVE SARS-CoV-2 target nucleic acids are DETECTED. The SARS-CoV-2 RNA is generally detectable in upper and lower  respiratory specimens dur ing the acute phase of infection.  Positive  results are indicative of active infection with SARS-CoV-2.  Clinical  correlation with patient history and other diagnostic information is  necessary to determine patient infection status.  Positive results do  not rule out bacterial infection or co-infection with other viruses. If result is PRESUMPTIVE POSTIVE SARS-CoV-2 nucleic acids MAY BE  PRESENT.   A presumptive positive result was obtained on the submitted specimen  and confirmed on repeat testing.  While 2019 novel coronavirus  (SARS-CoV-2) nucleic acids may be present in the submitted sample  additional confirmatory testing may be necessary for epidemiological  and / or clinical management purposes  to differentiate between  SARS-CoV-2 and other Sarbecovirus currently known to infect humans.  If clinically indicated additional testing with an alternate test  methodology [id number]) is advised. The SARS-CoV-2 RNA is generally  detectable in upper and lower respiratory sp ecimens during the acute  phase of infection. The expected result is Negative. Fact Sheet for Patients:  StrictlyIdeas.no Fact Sheet for Healthcare Providers: BankingDealers.co.za This test is not yet approved or cleared by the [place] FDA and has been authorized for detection and/or diagnosis of SARS-CoV-2 by FDA under an Emergency Use Authorization (EUA).  This EUA will remain in effect (meaning this test can be used) for the duration of the COVID-19 declaration under Section 564(b)(1) of the Act, 21 U.S.C. section 360bbb-3(b)(1), unless the authorization is terminated or revoked sooner. Performed at [facility], [address], [place], [place] [address]     Chemistries  Recent Labs  Lab 01/24/19 [phone]  NA 131*  K 3.0*  CL 93*  CO2 25  GLUCOSE 106*  BUN 7  CREATININE 0.62  CALCIUM 7.7*  AST 37  ALT 11  ALKPHOS 90  BILITOT 1.6*   ------------------------------------------------------------------------------------------------------------------  ------------------------------------------------------------------------------------------------------------------ GFR: Estimated Creatinine Clearance: 123.6 mL/min (by C-G formula based on SCr of 0.62 mg/dL). Liver Function Tests: Recent Labs  Lab 01/24/19 1627  AST 37  ALT  11  ALKPHOS 90  BILITOT 1.6*  PROT 7.6  ALBUMIN 3.0*   Recent Labs  Lab 01/24/19 1627  LIPASE 35   Recent Labs  Lab 01/24/19 1657  AMMONIA 15   Coagulation Profile: Recent Labs  Lab 01/24/19 1627  INR 1.3*   Cardiac Enzymes: No results for input(s): CKTOTAL, CKMB, CKMBINDEX, TROPONINI in the last 168 hours. BNP (last 3 results) No results for input(s): PROBNP in the last 8760 hours. HbA1C: No results for input(s): HGBA1C in the last 72 hours. CBG: No results for input(s): GLUCAP in the last 168 hours. Lipid Profile: No results for input(s): CHOL, HDL, LDLCALC, TRIG, CHOLHDL, LDLDIRECT in the last 72 hours. Thyroid Function Tests: No results for input(s): TSH, T4TOTAL, FREET4, T3FREE, THYROIDAB in the last 72 hours. Anemia Panel: No results for input(s): VITAMINB12, FOLATE, FERRITIN, TIBC, IRON, RETICCTPCT in the last 72 hours.  --------------------------------------------------------------------------------------------------------------- Urine analysis:    Component Value Date/Time   COLORURINE BROWN (A) 01/24/2019 1627   APPEARANCEUR TURBID (A) 01/24/2019 1627   LABSPEC 1.025 01/24/2019 1627   PHURINE 6.0 01/24/2019 1627   GLUCOSEU 100 (A) 01/24/2019 1627   HGBUR NEGATIVE 01/24/2019 1627   BILIRUBINUR MODERATE (A) 01/24/2019 1627   KETONESUR 15 (A) 01/24/2019 1627   PROTEINUR 30 (A) 01/24/2019 1627   NITRITE NEGATIVE 01/24/2019 1627   LEUKOCYTESUR NEGATIVE 01/24/2019 1627      Imaging Results:    Dg Chest 2 View  Result Date: 01/24/2019 CLINICAL DATA:  Shortness of breath EXAM: CHEST - 2 VIEW COMPARISON:  05/28/2016 Correlation: CT chest 05/28/2016, 01/31/2007 FINDINGS: Normal heart size, mediastinal contours, and pulmonary vascularity. Chronic nodular density RIGHT upper lobe 11 x 9 mm, previously 14 x 11 mm. This corresponds to a noncalcified 10 x 9 mm diameter nodule on the most recent prior CT, unchanged since 01/31/2007, indicative of benign behavior.  Bronchitic changes without pulmonary infiltrate, pleural effusion or pneumothorax. No acute osseous findings. IMPRESSION: Chronic bronchitic changes without infiltrate. Chronic RIGHT upper lobe nodule grossly stable since CT exam of 01/31/2007. Electronically Signed   By: Lavonia Dana M.D.   On: 01/24/2019 17:00   Ct Abdomen Pelvis W Contrast  Result Date: 01/24/2019 CLINICAL DATA:  Cirrhosis, abdominal swelling, ascites EXAM: CT ABDOMEN AND PELVIS WITH CONTRAST TECHNIQUE: Multidetector CT imaging of the abdomen and pelvis was performed using the standard protocol following bolus administration of intravenous contrast. CONTRAST:  113m OMNIPAQUE IOHEXOL 300 MG/ML  SOLN COMPARISON:  None. FINDINGS: Lower chest: No acute abnormality. Hepatobiliary: No solid liver abnormality is seen. Coarse contour of the liver. No gallstones, gallbladder wall thickening, or biliary dilatation. Pancreas: Unremarkable. No pancreatic ductal dilatation or surrounding inflammatory changes. Spleen: Splenomegaly, maximum coronal span 17.3 cm Adrenals/Urinary Tract: Adrenal glands are unremarkable. Kidneys are normal, without renal calculi, solid lesion, or hydronephrosis. Bladder is unremarkable. Stomach/Bowel: Stomach is within normal limits. Appendix is not clearly visualized. No evidence of bowel wall thickening, distention, or inflammatory changes. Vascular/Lymphatic: Aortic atherosclerosis. No enlarged abdominal or pelvic lymph nodes. Reproductive: No mass or other significant abnormality. Other: Fluid containing right inguinal hernia. Large  volume ascites. Musculoskeletal: No acute or significant osseous findings. IMPRESSION: 1.  Stigmata of cirrhosis, splenomegaly, and large volume ascites. 2.  Aortic atherosclerosis. Electronically Signed   By: Eddie Candle M.D.   On: 01/24/2019 20:15   ekg ST at 110, nl axis, q in v1-3,    Assessment & Plan:    Principal Problem:   Symptomatic anemia Active Problems:   EtOH dependence  (HCC)   Tobacco dependence   Alcoholic cirrhosis of liver with ascites (HCC)  Anemia Likely iron deficiency Check b12, folate, ferritin, iron, tibc, tsh,  Consider spep, upep Type and screen Transfuse 2 units prbc NPO for now protonix 79m iv bid Consider GI consult for w/up of iron deficiency anemia  Alcoholic cirrhosis , w ascites UKoreaguided paracentesis Consider albumin depending upon how much fluid is removed Start Spironolactone 533mpo qday  ETOH dep  Pt encouraged to try to stop drinking Librium 5054mo qday x 1 days then 73m60m qday x 1 day CIWA  Edema Secondary to hypoalbuminemia ? Check urine prot/ creatinine ratio r/o nephrotic range proteinuria Check TSH Check cardiac echo r/o cardiomyopathy Lasix 20mg44mqday  Tachycardia, ? Early etoh withdrawal Tele Check cardiac echo r/o cardiomyopathy  Hyponatremia likely secondary to etoh, and cirrhosis Check cmp in am  Hypokalemia repleted in ED Check cmp in am Check magnesium in am  Hypocalcemia Check magnesium Check cmp in am, if still low, consider calclium gluconate vs oral supplementation  Lung nodule Check CT chest     DVT Prophylaxis-    SCDs   AM Labs Ordered, also please review Full Orders  Family Communication: Admission, patients condition and plan of care including tests being ordered have been discussed with the patient  who indicate understanding and agree with the plan and Code Status.  Code Status:  FULL CODE, attempted to call spouse at [phone] a working number.   Admission status: Observation t: Based on patients clinical presentation and evaluation of above clinical data, I have made determination that patient meets observation criteria at this time.    Time spent in minutes : 70 minutes   JamesJani Gravelon 01/25/2019 at 6:13 AM

## 2019-01-25 NOTE — Progress Notes (Signed)
  Echocardiogram 2D Echocardiogram has been performed.  Moorea Boissonneault G Buffy Ehler 01/25/2019, 1:31 PM

## 2019-01-25 NOTE — Progress Notes (Addendum)
Patient seen and examined personally, I reviewed the chart, history and physical and admission note, done by admitting physician this morning and agree with the same with following addendum.  Please refer to the morning admission note for more detailed plan of care. Discussed with admitting physician.  Briefly,  58 year old male patient of Dr. Larose Kells, who was seen in the office for increasing swelling in the leg ankle and abdomen along with increasing shortness of breath, going on for 2 to 3 weeks, with prior history of heavy alcohol use currently consuming 1-3 beers on a daily basis last few months, smoker, D, chronic bronchitis using inhaler, anxiety sent to the ER for evaluation while noted to have hemoglobin of 8.2 g, and also significant ascites and transferred to [facility] long for blood transfusion and acetic paracentesis. Patient reports no prior colonoscopy or endoscopy. He had:CT scan abd/ pelvis:Stigmata of cirrhosis, splenomegaly, and large volume ascites.FHL:KTGYBWL bronchitic changes without infiltrate/Chronic RIGHT upper lobe nodule grossly stable since CT exam of Lab with hyponatremia, hypokalemia total bili 1.6 ammonia 15 EtOH less than 10 UDS negative FOBT negative, Wbc 4.0, hgb 6.2, Plt 202,Mcv 67.2, rdw 19.4 Urinalysis prot 30, wbc 6-10, rbc 0-5, BNP  92.8 Patient given lasix 20mg  iv x1, and benadryl 25mg  iv x1 in the ED COVID 19 rapid negative  On exam on room air, denies chest pain cough had some shortness of breath from abdominal distention. Has leg edema lungs are clear on exam alert awake oriented x3.  Issues being addressed:  Severe microcytic anemia, symptomatic: Work-up shows iron deficiency anemia, FOBT was negative.  SPEP/UPEP pending.  GI consulted for further work-up. Transfusing 2 units PRBC repeat hemoglobin after transfusion.npo for now  Alcoholic cirrhosis with ascites: Planning abdominal paracentesis.  If has large volume tap-may need to give albumin.  Ascites and  leg edema is likely related to alcoholic cirrhosis -Work-up in progress with TSH-nl, cortisol-11, urine protein/creatinine, echocardiogram.  Continue Lasix and Aldactone.  Alcohol abuse patient reports he used to do heavy alcohol and for past few months has cut down to 2-3 beers daily.  At risk for withdrawal Librium and CIWA ordered.  Tachycardia,? early etoh withdrawal, could be secondary to anemia.  Monitor  Lung nodule CT chest ordered.  Hypocalcemia/hypomagnesemia and hypokalemia: Being repleted.  Monitor  COVID-19 negative.repat covid with send out has been done overnight for some reason. Med center covid 19 neg.patient has no sick contacts, no fever, sore throat, myalgias. lungs clear and no suspicion of COVID:discussed with Dr. [place] Bells, ID, airborne isolation discontinued.discussed w admitting.  Change the patient to inpatient status due to further continued need for work-up blood transfusion diuretics paracentesis.

## 2019-01-25 NOTE — Progress Notes (Signed)
Pharmacy - IV Iron Dosing  Assessment: 49 yoM admitted for anemia, cirrhosis/ascites; found to have iron deficiency and Pharmacy asked to dose IV iron.     Component Value Date/Time   IRON 17 (L) 01/25/2019 0544   TIBC 427 01/25/2019 0544   FERRITIN 5 (L) 01/25/2019 0544   Plan:  Ferumoxytol 510 mg IV weekly x 2 doses  May repeat dosing if iron/Hgb has not reached goal in the appropriate timeframe  Consider PO iron supplementation if tolerated  Reuel Boom, PharmD Pager: [phone] 01/25/2019, 2:22 PM

## 2019-01-25 NOTE — Plan of Care (Signed)

## 2019-01-25 NOTE — Consult Note (Signed)
I have spoken with pt's PCP, reviewed patient's chart. They have a clear alternative diagnosis and COVID precautions are being d/c.  Thayer Headings, MD

## 2019-01-25 NOTE — ED Notes (Signed)
Report and paperwork given to Jasa T, EMT-P from [facility]

## 2019-01-25 NOTE — Progress Notes (Signed)
CRITICAL VALUE ALERT  Critical Value:  HGB 6.6   Date & Time Notied:  01/25/2019 6:17 AM   Provider Notified: Jeannette Corpus   Orders Received/Actions taken: Not at this time

## 2019-01-25 NOTE — Procedures (Signed)
PROCEDURE SUMMARY:  Successful US guided paracentesis from RLQ.  Yielded 10.8 L of clear yellow fluid.  No immediate complications.  Pt tolerated well.   Specimen was sent for labs.  EBL < 57mL  Ascencion Dike PA-C 01/25/2019 2:41 PM

## 2019-01-26 LAB — HEMOGLOBIN AND HEMATOCRIT, BLOOD
HCT: 31.8 % — ABNORMAL LOW (ref 39.0–52.0)
Hemoglobin: 8.7 g/dL — ABNORMAL LOW (ref 13.0–17.0)

## 2019-01-26 LAB — CBC
HCT: 27 % — ABNORMAL LOW (ref 39.0–52.0)
Hemoglobin: 7.2 g/dL — ABNORMAL LOW (ref 13.0–17.0)
MCH: 19.1 pg — ABNORMAL LOW (ref 26.0–34.0)
MCHC: 26.7 g/dL — ABNORMAL LOW (ref 30.0–36.0)
MCV: 71.6 fL — ABNORMAL LOW (ref 80.0–100.0)
Platelets: 176 10*3/uL (ref 150–400)
RBC: 3.77 MIL/uL — ABNORMAL LOW (ref 4.22–5.81)
RDW: 21 % — ABNORMAL HIGH (ref 11.5–15.5)
WBC: 3.9 10*3/uL — ABNORMAL LOW (ref 4.0–10.5)
nRBC: 0 % (ref 0.0–0.2)

## 2019-01-26 LAB — COMPREHENSIVE METABOLIC PANEL
ALT: 9 U/L (ref 0–44)
AST: 25 U/L (ref 15–41)
Albumin: 2.9 g/dL — ABNORMAL LOW (ref 3.5–5.0)
Alkaline Phosphatase: 66 U/L (ref 38–126)
Anion gap: 9 (ref 5–15)
BUN: 7 mg/dL (ref 6–20)
CO2: 29 mmol/L (ref 22–32)
Calcium: 7.6 mg/dL — ABNORMAL LOW (ref 8.9–10.3)
Chloride: 98 mmol/L (ref 98–111)
Creatinine, Ser: 0.54 mg/dL — ABNORMAL LOW (ref 0.61–1.24)
GFR calc Af Amer: >60 mL/min (ref >60)
GFR calc non Af Amer: >60 mL/min (ref >60)
Glucose, Bld: 98 mg/dL (ref 70–99)
Potassium: 3.4 mmol/L — ABNORMAL LOW (ref 3.5–5.1)
Sodium: 136 mmol/L (ref 135–145)
Total Bilirubin: 2.4 mg/dL — ABNORMAL HIGH (ref 0.3–1.2)
Total Protein: 6.5 g/dL (ref 6.5–8.1)

## 2019-01-26 LAB — GRAM STAIN

## 2019-01-26 LAB — NOVEL CORONAVIRUS, NAA (HOSP ORDER, SEND-OUT TO REF LAB; TAT 18-24 HRS): SARS-CoV-2, NAA: NOT DETECTED

## 2019-01-26 LAB — ABO/RH: ABO/RH(D): O POS

## 2019-01-26 LAB — PREPARE RBC (CROSSMATCH)

## 2019-01-26 MED ORDER — SODIUM CHLORIDE 0.9% IV SOLUTION
Freq: Once | INTRAVENOUS | Status: AC
  Administered 2019-01-26: 14:00:00 via INTRAVENOUS

## 2019-01-26 MED ORDER — PEG-KCL-NACL-NASULF-NA ASC-C 100 G PO SOLR
0.5000 | Freq: Once | ORAL | Status: AC
  Administered 2019-01-26: 100 g via ORAL
  Filled 2019-01-26: qty 1

## 2019-01-26 MED ORDER — POTASSIUM CHLORIDE CRYS ER 20 MEQ PO TBCR
40.0000 meq | EXTENDED_RELEASE_TABLET | Freq: Once | ORAL | Status: AC
  Administered 2019-01-26: 40 meq via ORAL
  Filled 2019-01-26: qty 2

## 2019-01-26 MED ORDER — PEG-KCL-NACL-NASULF-NA ASC-C 100 G PO SOLR: 1.0000 | Freq: Once | ORAL | Status: DC

## 2019-01-26 MED ORDER — POTASSIUM CHLORIDE CRYS ER 20 MEQ PO TBCR
20.0000 meq | EXTENDED_RELEASE_TABLET | Freq: Every day | ORAL | Status: DC
  Administered 2019-01-27 – 2019-01-28 (×2): 20 meq via ORAL
  Filled 2019-01-26 (×2): qty 1

## 2019-01-26 NOTE — Progress Notes (Signed)
PROGRESS NOTE    William Whitaker  JOA:[id number] DOB: [birth year] DOA: 01/24/2019 PCP: Colon Branch, MD   Brief Narrative: 58 year old male patient of Dr. Larose Kells, who was seen in the office for increasing swelling in the leg ankle and abdomen along with increasing shortness of breath, going on for 2 to 3 weeks, with prior history of heavy alcohol use currently consuming 1-3 beers on a daily basis last few months, smoker, D, chronic bronchitis using inhaler, anxiety sent to the ER for evaluation while noted to have hemoglobin of 8.2 g, and also significant ascites and transferred to [facility] long for blood transfusion and acetic paracentesis. Patient reports no prior colonoscopy or endoscopy. He had:CT scan abd/ pelvis:Stigmata of cirrhosis, splenomegaly, and large volume ascites.SWF:UXNATFT bronchitic changes without infiltrate/Chronic RIGHT upper lobe nodule grossly stable since CT exam of Lab with hyponatremia, hypokalemia total bili 1.6 ammonia 15 EtOH less than 10 UDS negative FOBT negative, Wbc 4.0, hgb 6.2, Plt 202,Mcv 67.2, rdw 19.4 Urinalysis prot 30, wbc 6-10, rbc 0-5, BNP 92.8 Patient given lasix 24m iv x1, and benadryl 250miv x1 in the ED COVID 19 rapid negative  Subjective: This morning was waking up from sleep.  Alert awake denies any complaints.  Abdominal distention feels much improved.  No nausea vomiting or chest pain.  Assessment & Plan:  Severe microcytic anemia, symptomatic: Work-up shows iron deficiency anemia, FOBT was negative.  SPEP/UPEP pending.  GI consulted for further work-up-anticipating EGD/colonoscopy Transfused 2 units PRBC- hb in 7.2 gm-we will transfuse 1 more  PRBC due to symptomatic anemia. Recent Labs  Lab 01/24/19 1627 01/25/19 0544 01/26/19 0513  HGB 6.2* 6.3* 7.2*  HCT 24.0* [id number]  Alcoholic cirrhosis with ascites: s/p 10.8 litre abdominal paracentesis w iv albumin. Ascites and leg edema is likely related to alcoholic cirrhosis  Cytology  pending, no evidence of SBP. Work-up pending with hepatitis serology, AFP, A1 AT, ANA, SMA, AMA, IgG, ceruloplasmin and TTG-defer to GI. TSH-nl, cortisol-11, TTE shows normal EF no acute finding ruling out cardiac etiology.   Continue Lasix and Aldactone.  Alcohol abuse patient reports he used to do heavy alcohol and for past few months has cut down to 2-3 beers daily.  At risk for withdrawal- S/P Librium and ON CIWA Protocol.  Tachycardia,? early etoh withdrawal, could be secondary to anemia.    Rate stable.  Monitor  Lung nodule CT chest- nodule of 1.0 cm. Consider follow-up CT of the chest without contrast in 6-12 months. RIGHT UPPER lobe solid nodule contains punctate calcifications centrally, favoring benign process now measuring 1.0 centimeters.  Hypocalcemia/hypomagnesemia and hypokalemia:  improved much, potassium still low will replete orally.  Add magnesium oxide as well.  COVID-19 negative.repeat covid with send out has been done overnight for some reason. Med center covid 19 neg.patient has no sick contacts, no fever, sore throat, myalgias. lungs clear and no suspicion of COVID:discussed with Dr. McLake BellsID, airborne isolation discontinued.discussed w admitting.  DVT prophylaxis: SCD Code Status: FULL Family Communication: plan of care discussed with patient in detail.  Disposition Plan: Remains inpatient pending clinical improvement.  Consultants:  GI  Procedures:  TTE 7/25: 1. The left ventricle has normal systolic function, with an ejection fraction of 55-60%. The cavity size was normal. Indeterminate diastolic filling due to E-A fusion.  2. The right ventricle has normal systolic function. The cavity was normal. There is no increase in right ventricular wall thickness. Right ventricular systolic pressure could not be assessed.  3.  Mild thickening of the mitral valve leaflet.  4. The aortic valve is tricuspid. No stenosis of the aortic valve.  5. The aortic root and  ascending aorta are normal in size and structure.  6. There is redundancy of the interatrial septum.  7. The inferior vena cava was normal in size with <50% respiratory variability.  Ultrasound-guided abdominal paracentesis 7/25 Successful ultrasound-guided paracentesis yielding 10.8 liters of peritoneal fluid.  CT scan chest without contrast 1. Ground-glass nodule in the LEFT UPPER lobe, measuring 1.0 cm. Consider follow-up CT of the chest without contrast in 6-12 months. 2. RIGHT UPPER lobe solid nodule contains punctate calcifications centrally, favoring benign process now measuring 1.0 centimeters. 3. Smaller 3 millimeter nodule within the RIGHT UPPER lobe. 4. Evidence for prior granulomatous disease. 5. Coronary artery disease. 6. Remote RIGHT clavicle fracture. 7. Aortic Atherosclerosis (ICD10-I70.0). 8. Large volume ascites in the UPPER abdomen. 9. Cirrhosis  Microbiology:  Antimicrobials: Anti-infectives (From admission, onward)   None       Objective: Vitals:   01/26/19 0206 01/26/19 0615 01/26/19 1030 01/26/19 1119  BP: 122/69 115/72  113/71  Pulse: 86 87  94  Resp: 18 18  20   Temp: 97.9 F (36.6 C) 98.1 F (36.7 C)  97.9 F (36.6 C)  TempSrc: Oral Oral  Oral  SpO2: 94% 94% 90% 92%  Weight:  90.2 kg    Height:        Intake/Output Summary (Last 24 hours) at 01/26/2019 1128 Last data filed at 01/26/2019 0206 Gross per 24 hour  Intake 1120.99 ml  Output 1275 ml  Net -154.01 ml   Filed Weights   01/24/19 1519 01/25/19 0618 01/26/19 0615  Weight: 103.4 kg 102.2 kg 90.2 kg   Weight change: -13.2 kg  Body mass index is 26.96 kg/m.  Intake/Output from previous day: 07/25 0701 - 07/26 0700 In: 1121 [P.O.:240; Blood:672; IV Piggyback:209] Out: 4536 [Urine:1275] Intake/Output this shift: No intake/output data recorded.  Examination:  General exam: Appears calm and comfortable, older than the stated age, chronically sick looking, NAD    HEENT:PERRL,Oral mucosa moist, Ear/Nose normal on gross exam Respiratory system: Bilateral equal air entry, normal vesicular breath sounds, no wheezes or crackles  Cardiovascular system: S1 & S2 heard,No JVD, murmurs. Gastrointestinal system: Abdomen is  soft, moderate distended, nontender Nervous System:Alert and oriented. No focal neurological deficits/moving extremities, sensation intact. Extremities: Bilateral ankle edema, no clubbing, distal peripheral pulses palpable. Skin: No rashes, lesions, no icterus MSK: Normal muscle bulk,tone ,power  Medications:  Scheduled Meds:  sodium chloride   Intravenous Once   albuterol  2.5 mg Nebulization BID   folic acid  1 mg Oral Daily   furosemide  20 mg Intravenous Daily   LORazepam  0-4 mg Intravenous Q6H   Followed by   Derrill Memo ON 01/27/2019] LORazepam  0-4 mg Intravenous Q12H   multivitamin with minerals  1 tablet Oral Daily   pantoprazole (PROTONIX) IV  40 mg Intravenous Q12H   peg 3350 powder  0.5 kit Oral Once   And   peg 3350 powder  0.5 kit Oral Once   [START ON 01/27/2019] potassium chloride  20 mEq Oral Daily   sodium chloride flush  3 mL Intravenous Q12H   spironolactone  50 mg Oral Daily   thiamine  100 mg Oral Daily   Or   thiamine  100 mg Intravenous Daily   Continuous Infusions:  sodium chloride     ferumoxytol Stopped (01/25/19 2118)    Data Reviewed:  I have personally reviewed following labs and imaging studies  CBC: Recent Labs  Lab 01/24/19 1627 01/25/19 0544 01/26/19 0513  WBC 4.0 4.5 3.9*  NEUTROABS 2.5  --   --   HGB 6.2* 6.3* 7.2*  HCT 24.0* 24.8* 27.0*  MCV 67.2* 68.3* 71.6*  PLT 202 214 326   Basic Metabolic Panel: Recent Labs  Lab 01/24/19 1627 01/25/19 0523 01/25/19 0544 01/26/19 0513  NA 131*  --  133* 136  K 3.0*  --  3.2* 3.4*  CL 93*  --  95* 98  CO2 25  --  28 29  GLUCOSE 106*  --  99 98  BUN 7  --  7 7  CREATININE 0.62  --  0.62 0.54*  CALCIUM 7.7*  --  7.8*  7.6*  MG  --  1.3*  --   --    GFR: Estimated Creatinine Clearance: 109.1 mL/min (A) (by C-G formula based on SCr of 0.54 mg/dL (L)). Liver Function Tests: Recent Labs  Lab 01/24/19 1627 01/25/19 0544 01/26/19 0513  AST 37 38 25  ALT 11 12 9   ALKPHOS 90 89 66  BILITOT 1.6* 1.9* 2.4*  PROT 7.6 8.1 6.5  ALBUMIN 3.0* 3.2* 2.9*   Recent Labs  Lab 01/24/19 1627  LIPASE 35   Recent Labs  Lab 01/24/19 1657  AMMONIA 15   Coagulation Profile: Recent Labs  Lab 01/24/19 1627  INR 1.3*   Cardiac Enzymes: No results for input(s): CKTOTAL, CKMB, CKMBINDEX, TROPONINI in the last 168 hours. BNP (last 3 results) No results for input(s): PROBNP in the last 8760 hours. HbA1C: No results for input(s): HGBA1C in the last 72 hours. CBG: No results for input(s): GLUCAP in the last 168 hours. Lipid Profile: No results for input(s): CHOL, HDL, LDLCALC, TRIG, CHOLHDL, LDLDIRECT in the last 72 hours. Thyroid Function Tests: Recent Labs    01/25/19 0544  TSH 3.002   Anemia Panel: Recent Labs    01/25/19 0544  VITAMINB12 508  FERRITIN 5*  TIBC 427  IRON 17*   Sepsis Labs: No results for input(s): PROCALCITON, LATICACIDVEN in the last 168 hours.  Recent Results (from the past 240 hour(s))  SARS Coronavirus 2 (Performed in [place] hospital lab)     Status: None   Collection Time: 01/24/19  9:36 PM   Specimen: Nasopharyngeal Swab  Result Value Ref Range Status   SARS Coronavirus 2 NEGATIVE NEGATIVE Final    Comment: (NOTE) If result is NEGATIVE SARS-CoV-2 target nucleic acids are NOT DETECTED. The SARS-CoV-2 RNA is generally detectable in upper and lower  respiratory specimens during the acute phase of infection. The lowest  concentration of SARS-CoV-2 viral copies this assay can detect is 250  copies / mL. A negative result does not preclude SARS-CoV-2 infection  and should not be used as the sole basis for treatment or other  patient management decisions.  A negative  result may occur with  improper specimen collection / handling, submission of specimen other  than nasopharyngeal swab, presence of viral mutation(s) within the  areas targeted by this assay, and inadequate number of viral copies  (<250 copies / mL). A negative result must be combined with clinical  observations, patient history, and epidemiological information. If result is POSITIVE SARS-CoV-2 target nucleic acids are DETECTED. The SARS-CoV-2 RNA is generally detectable in upper and lower  respiratory specimens dur ing the acute phase of infection.  Positive  results are indicative of active infection with SARS-CoV-2.  Clinical  correlation with patient history and other diagnostic information is  necessary to determine patient infection status.  Positive results do  not rule out bacterial infection or co-infection with other viruses. If result is PRESUMPTIVE POSTIVE SARS-CoV-2 nucleic acids MAY BE PRESENT.   A presumptive positive result was obtained on the submitted specimen  and confirmed on repeat testing.  While 2019 novel coronavirus  (SARS-CoV-2) nucleic acids may be present in the submitted sample  additional confirmatory testing may be necessary for epidemiological  and / or clinical management purposes  to differentiate between  SARS-CoV-2 and other Sarbecovirus currently known to infect humans.  If clinically indicated additional testing with an alternate test  methodology [phone]) is advised. The SARS-CoV-2 RNA is generally  detectable in upper and lower respiratory sp ecimens during the acute  phase of infection. The expected result is Negative. Fact Sheet for Patients:  StrictlyIdeas.no Fact Sheet for Healthcare Providers: BankingDealers.co.za This test is not yet approved or cleared by the [place] FDA and has been authorized for detection and/or diagnosis of SARS-CoV-2 by FDA under an Emergency Use Authorization  (EUA).  This EUA will remain in effect (meaning this test can be used) for the duration of the COVID-19 declaration under Section 564(b)(1) of the Act, 21 U.S.C. section 360bbb-3(b)(1), unless the authorization is terminated or revoked sooner. Performed at [facility], [address] [place], [place] [address]   Culture, body fluid-bottle     Status: None (Preliminary result)   Collection Time: 01/25/19  2:03 PM   Specimen: Ascitic  Result Value Ref Range Status   Specimen Description ASCITIC  Final   Special Requests NONE  Final   Culture   Final    NO GROWTH < 12 HOURS Performed at [facility], [place] [address], [place], [place] [address]    Report Status PENDING  Incomplete      Radiology Studies: Dg Chest 2 View  Result Date: 01/24/2019 CLINICAL DATA:  Shortness of breath EXAM: CHEST - 2 VIEW COMPARISON:  05/28/2016 Correlation: CT chest 05/28/2016, 01/31/2007 FINDINGS: Normal heart size, mediastinal contours, and pulmonary vascularity. Chronic nodular density RIGHT upper lobe 11 x 9 mm, previously 14 x 11 mm. This corresponds to a noncalcified 10 x 9 mm diameter nodule on the most recent prior CT, unchanged since 01/31/2007, indicative of benign behavior. Bronchitic changes without pulmonary infiltrate, pleural effusion or pneumothorax. No acute osseous findings. IMPRESSION: Chronic bronchitic changes without infiltrate. Chronic RIGHT upper lobe nodule grossly stable since CT exam of 01/31/2007. Electronically Signed   By: Lavonia Dana M.D.   On: 01/24/2019 17:00   Ct Chest Wo Contrast  Result Date: 01/25/2019 CLINICAL DATA:  Lung nodule.  Smoker. EXAM: CT CHEST WITHOUT CONTRAST TECHNIQUE: Multidetector CT imaging of the chest was performed following the standard protocol without IV contrast. COMPARISON:  CT of the chest abdomen and pelvis on 05/28/2016 FINDINGS: Cardiovascular: There is atherosclerotic calcification of the coronary arteries. The heart is mildly  enlarged. No pericardial effusion. There is atherosclerosis of the thoracic aorta, not associated with aneurysm. Noncontrast appearance of the pulmonary arteries is unremarkable. Mediastinum/Nodes: Normal appearance of the esophagus. The visualized portion of the thyroid gland has a normal appearance. No mediastinal, hilar, or axillary adenopathy. Lungs/Pleura: Within the LEFT UPPER lobe there is a focal 1.0 centimeter ground-glass nodule, possibly associated with central lucency (image 29/7). Within the RIGHT UPPER lobe there is a 1.0 centimeter nodule containing central punctate calcification, previously measuring 0.8 centimeters.A  3 millimeter nodule is identified within the RIGHT UPPER lobe on image 53/7. There are scattered calcified granulomata. No focal consolidations or pleural effusions. No pulmonary edema. There is minimal scarring at the LEFT lung base. Upper Abdomen: There is a large volume of ascites in the UPPER abdomen. The liver contour is nodular and rounded, consistent with cirrhosis. Numerous varices are identified in the LEFT UPPER QUADRANT. Musculoskeletal: Remote RIGHT clavicle fracture. No suspicious lytic or blastic lesions. IMPRESSION: 1. Ground-glass nodule in the LEFT UPPER lobe, measuring 1.0 cm. Consider follow-up CT of the chest without contrast in 6-12 months. 2. RIGHT UPPER lobe solid nodule contains punctate calcifications centrally, favoring benign process now measuring 1.0 centimeters. 3. Smaller 3 millimeter nodule within the RIGHT UPPER lobe. 4. Evidence for prior granulomatous disease. 5. Coronary artery disease. 6. Remote RIGHT clavicle fracture. 7. Aortic Atherosclerosis (ICD10-I70.0). 8. Large volume ascites in the UPPER abdomen. 9. Cirrhosis. Electronically Signed   By: Nolon Nations M.D.   On: 01/25/2019 14:04   Ct Abdomen Pelvis W Contrast  Result Date: 01/24/2019 CLINICAL DATA:  Cirrhosis, abdominal swelling, ascites EXAM: CT ABDOMEN AND PELVIS WITH CONTRAST  TECHNIQUE: Multidetector CT imaging of the abdomen and pelvis was performed using the standard protocol following bolus administration of intravenous contrast. CONTRAST:  175m OMNIPAQUE IOHEXOL 300 MG/ML  SOLN COMPARISON:  None. FINDINGS: Lower chest: No acute abnormality. Hepatobiliary: No solid liver abnormality is seen. Coarse contour of the liver. No gallstones, gallbladder wall thickening, or biliary dilatation. Pancreas: Unremarkable. No pancreatic ductal dilatation or surrounding inflammatory changes. Spleen: Splenomegaly, maximum coronal span 17.3 cm Adrenals/Urinary Tract: Adrenal glands are unremarkable. Kidneys are normal, without renal calculi, solid lesion, or hydronephrosis. Bladder is unremarkable. Stomach/Bowel: Stomach is within normal limits. Appendix is not clearly visualized. No evidence of bowel wall thickening, distention, or inflammatory changes. Vascular/Lymphatic: Aortic atherosclerosis. No enlarged abdominal or pelvic lymph nodes. Reproductive: No mass or other significant abnormality. Other: Fluid containing right inguinal hernia. Large volume ascites. Musculoskeletal: No acute or significant osseous findings. IMPRESSION: 1.  Stigmata of cirrhosis, splenomegaly, and large volume ascites. 2.  Aortic atherosclerosis. Electronically Signed   By: AEddie CandleM.D.   On: 01/24/2019 20:15   [place]orea[place]acentesis  Result Date: 01/25/2019 INDICATION: Abdominal distention. Ascites. Request for diagnostic and therapeutic paracentesis. EXAM: ULTRASOUND GUIDED RIGHT LOWER QUADRANT PARACENTESIS MEDICATIONS: None. COMPLICATIONS: None immediate. PROCEDURE: Informed written consent was obtained from the patient after a discussion of the risks, benefits and alternatives to treatment. A timeout was performed prior to the initiation of the procedure. Initial ultrasound scanning demonstrates a large amount of ascites within the right lower abdominal quadrant. The right lower abdomen was prepped and draped in  the usual sterile fashion. 1% lidocaine with epinephrine was used for local anesthesia. Following this, a 19 gauge, 7-cm, Yueh catheter was introduced. An ultrasound image was saved for documentation purposes. The paracentesis was performed. The catheter was removed and a dressing was applied. The patient tolerated the procedure well without immediate post procedural complication. FINDINGS: A total of approximately 10.8 L of clear yellow fluid was removed. Samples were sent to the laboratory as requested by the clinical team. IMPRESSION: Successful ultrasound-guided paracentesis yielding 10.8 liters of peritoneal fluid. Read by: KAscencion DikePA-C Electronically Signed   By: JSandi MariscalM.D.   On: 01/25/2019 14:42      LOS: 1 day   Time spent: More than 50% of that time was spent in counseling and/or coordination of care.  RAntonieta Pert  MD [facility]  01/26/2019, 11:28 AM

## 2019-01-26 NOTE — Progress Notes (Signed)
° ° ° °[facility] Progress Note ° °CC:  IDA, cirrhosis, ascites  ° °Subjective: He denies having any N/V or abdominal pain. He is more lethargic today but awakened easily when name called. He received Ativan 2mg IV per CIWA protocol this am at 0516. ° ° °Objective:  °Vital signs in last 24 hours: °Temp:  [97.9 °F (36.6 °C)-99.6 °F (37.6 °C)] 98.1 °F (36.7 °C) (07/26 0615) °Pulse Rate:  [86-101] 87 (07/26 0615) °Resp:  [18-24] 18 (07/26 0615) °BP: (113-134)/(58-75) 115/72 (07/26 0615) °SpO2:  [91 %-99 %] 94 % (07/26 0615) °Weight:  [90.2 kg] 90.2 kg (07/26 0615) °Last BM Date: 01/23/19 °General: Lethargic, received Ativan at 5am, awakens to name, disoriented to place. °Eyes: Mild scleral icterus, conjunctiva pink. °Heart: RRR, no murmurs. °Pulm:  Coarse breath sounds throughout. Yellow sputum on pillow case. °Abdomen: Distended but less tense after paracentesis, residual ascites present, a firm 4 to 5 cm mass palpated midline RUQ which was not appreciated on yesterday's exam when abdomen was tense, nontender, paracentesis RLQ site intact, drsg dry. Hypoactive BS x 4 quads. °Extremities:  LEs with 1+ pitting edema  °Neurologic:  Alert to name. Follows commands. Moves all extremities. No asterixis.  °Psych:  Alert and cooperative. Normal mood and affect. ° °Intake/Output from previous day: °07/25 0701 - 07/26 0700 °In: 1121 [P.O.:240; Blood:672; IV Piggyback:209] °Out: 1275 [Urine:1275] °Intake/Output this shift: °No intake/output data recorded. ° °Lab Results: °Recent Labs  °  01/24/19 °1627 01/25/19 °0544 01/26/19 °0513  °WBC 4.0 4.5 3.9*  °HGB 6.2* 6.3* 7.2*  °HCT 24.0* 24.8* 27.0*  °PLT 202 214 176  ° °BMET °Recent Labs  °  01/24/19 °1627 01/25/19 °0544 01/26/19 °0513  °NA 131* 133* 136  °K 3.0* 3.2* 3.4*  °CL 93* 95* 98  °CO2 25 28 29  °GLUCOSE 106* 99 98  °BUN 7 7 7  °CREATININE 0.62 0.62 0.54*  °CALCIUM 7.7* 7.8* 7.6*  ° °LFT °Recent Labs  °  01/26/19 °0513  °PROT 6.5  °ALBUMIN 2.9*  °AST 25  °ALT  9  °ALKPHOS 66  °BILITOT 2.4*  ° °PT/INR °Recent Labs  °  01/24/19 °1627  °LABPROT 16.1*  °INR 1.3*  ° °Hepatitis Panel °No results for input(s): HEPBSAG, HCVAB, HEPAIGM, HEPBIGM in the last 72 hours. ° °Dg Chest 2 View ° °Result Date: 01/24/2019 °CLINICAL DATA:  Shortness of breath EXAM: CHEST - 2 VIEW COMPARISON:  05/28/2016 Correlation: CT chest 05/28/2016, 01/31/2007 FINDINGS: Normal heart size, mediastinal contours, and pulmonary vascularity. Chronic nodular density RIGHT upper lobe 11 x 9 mm, previously 14 x 11 mm. This corresponds to a noncalcified 10 x 9 mm diameter nodule on the most recent prior CT, unchanged since 01/31/2007, indicative of benign behavior. Bronchitic changes without pulmonary infiltrate, pleural effusion or pneumothorax. No acute osseous findings. IMPRESSION: Chronic bronchitic changes without infiltrate. Chronic RIGHT upper lobe nodule grossly stable since CT exam of 01/31/2007. Electronically Signed   By: Mark  Boles M.D.   On: 01/24/2019 17:00  ° °Ct Chest Wo Contrast ° °Result Date: 01/25/2019 °CLINICAL DATA:  Lung nodule.  Smoker. EXAM: CT CHEST WITHOUT CONTRAST TECHNIQUE: Multidetector CT imaging of the chest was performed following the standard protocol without IV contrast. COMPARISON:  CT of the chest abdomen and pelvis on 05/28/2016 FINDINGS: Cardiovascular: There is atherosclerotic calcification of the coronary arteries. The heart is mildly enlarged. No pericardial effusion. There is atherosclerosis of the thoracic aorta, not associated with aneurysm. Noncontrast appearance of the pulmonary arteries is unremarkable.   Mediastinum/Nodes: Normal appearance of the esophagus. The visualized portion of the thyroid gland has a normal appearance. No mediastinal, hilar, or axillary adenopathy. Lungs/Pleura: Within the LEFT UPPER lobe there is a focal 1.0 centimeter ground-glass nodule, possibly associated with central lucency (image 29/7). Within the RIGHT UPPER lobe there is a 1.0  centimeter nodule containing central punctate calcification, previously measuring 0.8 centimeters.A 3 millimeter nodule is identified within the RIGHT UPPER lobe on image 53/7. There are scattered calcified granulomata. No focal consolidations or pleural effusions. No pulmonary edema. There is minimal scarring at the LEFT lung base. Upper Abdomen: There is a large volume of ascites in the UPPER abdomen. The liver contour is nodular and rounded, consistent with cirrhosis. Numerous varices are identified in the LEFT UPPER QUADRANT. Musculoskeletal: Remote RIGHT clavicle fracture. No suspicious lytic or blastic lesions. IMPRESSION: 1. Ground-glass nodule in the LEFT UPPER lobe, measuring 1.0 cm. Consider follow-up CT of the chest without contrast in 6-12 months. 2. RIGHT UPPER lobe solid nodule contains punctate calcifications centrally, favoring benign process now measuring 1.0 centimeters. 3. Smaller 3 millimeter nodule within the RIGHT UPPER lobe. 4. Evidence for prior granulomatous disease. 5. Coronary artery disease. 6. Remote RIGHT clavicle fracture. 7. Aortic Atherosclerosis (ICD10-I70.0). 8. Large volume ascites in the UPPER abdomen. 9. Cirrhosis. Electronically Signed   By: Elizabeth  Brown M.D.   On: 01/25/2019 14:04  ° °Ct Abdomen Pelvis W Contrast ° °Result Date: 01/24/2019 °CLINICAL DATA:  Cirrhosis, abdominal swelling, ascites EXAM: CT ABDOMEN AND PELVIS WITH CONTRAST TECHNIQUE: Multidetector CT imaging of the abdomen and pelvis was performed using the standard protocol following bolus administration of intravenous contrast. CONTRAST:  100mL OMNIPAQUE IOHEXOL 300 MG/ML  SOLN COMPARISON:  None. FINDINGS: Lower chest: No acute abnormality. Hepatobiliary: No solid liver abnormality is seen. Coarse contour of the liver. No gallstones, gallbladder wall thickening, or biliary dilatation. Pancreas: Unremarkable. No pancreatic ductal dilatation or surrounding inflammatory changes. Spleen: Splenomegaly, maximum  coronal span 17.3 cm Adrenals/Urinary Tract: Adrenal glands are unremarkable. Kidneys are normal, without renal calculi, solid lesion, or hydronephrosis. Bladder is unremarkable. Stomach/Bowel: Stomach is within normal limits. Appendix is not clearly visualized. No evidence of bowel wall thickening, distention, or inflammatory changes. Vascular/Lymphatic: Aortic atherosclerosis. No enlarged abdominal or pelvic lymph nodes. Reproductive: No mass or other significant abnormality. Other: Fluid containing right inguinal hernia. Large volume ascites. Musculoskeletal: No acute or significant osseous findings. IMPRESSION: 1.  Stigmata of cirrhosis, splenomegaly, and large volume ascites. 2.  Aortic atherosclerosis. Electronically Signed   By: Alex  Bibbey M.D.   On: 01/24/2019 20:15  ° °Us Paracentesis ° °Result Date: 01/25/2019 °INDICATION: Abdominal distention. Ascites. Request for diagnostic and therapeutic paracentesis. EXAM: ULTRASOUND GUIDED RIGHT LOWER QUADRANT PARACENTESIS MEDICATIONS: None. COMPLICATIONS: None immediate. PROCEDURE: Informed written consent was obtained from the patient after a discussion of the risks, benefits and alternatives to treatment. A timeout was performed prior to the initiation of the procedure. Initial ultrasound scanning demonstrates a large amount of ascites within the right lower abdominal quadrant. The right lower abdomen was prepped and draped in the usual sterile fashion. 1% lidocaine with epinephrine was used for local anesthesia. Following this, a 19 gauge, 7-cm, Yueh catheter was introduced. An ultrasound image was saved for documentation purposes. The paracentesis was performed. The catheter was removed and a dressing was applied. The patient tolerated the procedure well without immediate post procedural complication. FINDINGS: A total of approximately 10.8 L of clear yellow fluid was removed. Samples were sent to the laboratory as   requested by the clinical team. IMPRESSION:  Successful ultrasound-guided paracentesis yielding 10.8 liters of peritoneal fluid. Read by: Kevin Bruning PA-C Electronically Signed   By: John  Watts M.D.   On: 01/25/2019 14:42  ° ° °Assessment / Plan: °1. 58 y.o. male with history of EtOH abuse presents with SOB, ascites and LE edema. CTAP showed stigmata of cirrhosis, splenomegaly and large volume ascites. S/P paracentesis 7/25, 10.8L clear yellow peritoneal fluid removed. He received 50gm IV Albumin post paracentesis. No evidence of SBP. SAAG c/w cirrhosis. Cytology pending. ECHO showed LV EF 50-60%. RV normal systolic function. Normal renal function. AST 25. ALT 9. Alk phos 66. T. Bili 2.4. up >> 1.9. °-await pending lab results: Hep B core total, HeB surface ag, Hep C ab, AFP, A1AT, ANA, SMA, AMA, IgG, Ceruloplasmin and Ttg °-Lasix 20mg IV daily, Spironolactone 50mg QD °-Low Sodium diet °-? Abdominal MRI to assess RUQ mass  Palpated on exam today, not identified on CTAP ° °2. Iron deficiency Anemia. Hg 6.3. Hct 24.8 (Hg 15 04/2013). MCV 68.3.BUN  Iron 17.  °TIBC 427. Ferritin 5. Vitamin B12 508. Transfused 2 units PRBCs. Hg 7.2. Feraheme 510mg IV  x 1 infused. No obvious signs of GI bleeding. FOBT negative. °-will need EGD and colonoscopy to assess for EV, upper and lower GI  Bleeding although FOBT 7/24 was negative, r/o upper/lower GI malignancy. Further recommendations per Dr. Pyrtle. °-Pantoprazole 40mg IV bid °-clear liquid diet °-repeat cbc in am, retic count  ° °3. Hypokalemia. K+ 3.4 °-KCL replacement per hospitalist  ° °4. EtOH abuse, CIWA protocol in process °-avoid oversedation  ° °Further recommendations per Dr. Pyrtle  ° ° ° ° ° LOS: 1 day  ° °Fifi Schindler M Kennedy-Smith  01/26/2019, 7:47 AM ° ° °

## 2019-01-26 NOTE — Progress Notes (Signed)
I assumed care of this pt @ 2300. I agree with the previous nurses' assessment.

## 2019-01-26 NOTE — H&P (View-Only) (Signed)
[facility] Progress Note  CC:  IDA, cirrhosis, ascites   Subjective: He denies having any N/V or abdominal pain. He is more lethargic today but awakened easily when name called. He received Ativan 34m IV per CIWA protocol this am at 0516.   Objective:  Vital signs in last 24 hours: Temp:  [97.9 F (36.6 C)-99.6 F (37.6 C)] 98.1 F (36.7 C) (07/26 0615) Pulse Rate:  [86-101] 87 (07/26 0615) Resp:  [18-24] 18 (07/26 0615) BP: (113-134)/(58-75) 115/72 (07/26 0615) SpO2:  [91 %-99 %] 94 % (07/26 0615) Weight:  [90.2 kg] 90.2 kg (07/26 0615) Last BM Date: 01/23/19 General: Lethargic, received Ativan at 5am, awakens to name, disoriented to place. Eyes: Mild scleral icterus, conjunctiva pink. Heart: RRR, no murmurs. Pulm:  Coarse breath sounds throughout. Yellow sputum on pillow case. Abdomen: Distended but less tense after paracentesis, residual ascites present, a firm 4 to 5 cm mass palpated midline RUQ which was not appreciated on yesterday's exam when abdomen was tense, nontender, paracentesis RLQ site intact, drsg dry. Hypoactive BS x 4 quads. Extremities:  LEs with 1+ pitting edema  Neurologic:  Alert to name. Follows commands. Moves all extremities. No asterixis.  Psych:  Alert and cooperative. Normal mood and affect.  Intake/Output from previous day: 07/25 0701 - 07/26 0700 In: 1121 [P.O.:240; Blood:672; IV Piggyback:209] Out: 16579[Urine:1275] Intake/Output this shift: No intake/output data recorded.  Lab Results: Recent Labs    01/24/19 1627 01/25/19 0544 01/26/19 0513  WBC 4.0 4.5 3.9*  HGB 6.2* 6.3* 7.2*  HCT 24.0* 24.8* 27.0*  PLT 202 214 176   BMET Recent Labs    01/24/19 1627 01/25/19 0544 01/26/19 0513  NA 131* 133* 136  K 3.0* 3.2* 3.4*  CL 93* 95* 98  CO2 _0 GLUCOSE 106* 99 98  BUN _1 CREATININE 0.62 0.62 0.54*  CALCIUM 7.7* 7.8* 7.6*   LFT Recent Labs    01/26/19 0513  PROT 6.5  ALBUMIN 2.9*  AST 25  ALT  9  ALKPHOS 66  BILITOT 2.4*   PT/INR Recent Labs    01/24/19 1627  LABPROT 16.1*  INR 1.3*   Hepatitis Panel No results for input(s): HEPBSAG, HCVAB, HEPAIGM, HEPBIGM in the last 72 hours.  Dg Chest 2 View  Result Date: 01/24/2019 CLINICAL DATA:  Shortness of breath EXAM: CHEST - 2 VIEW COMPARISON:  05/28/2016 Correlation: CT chest 05/28/2016, 01/31/2007 FINDINGS: Normal heart size, mediastinal contours, and pulmonary vascularity. Chronic nodular density RIGHT upper lobe 11 x 9 mm, previously 14 x 11 mm. This corresponds to a noncalcified 10 x 9 mm diameter nodule on the most recent prior CT, unchanged since 01/31/2007, indicative of benign behavior. Bronchitic changes without pulmonary infiltrate, pleural effusion or pneumothorax. No acute osseous findings. IMPRESSION: Chronic bronchitic changes without infiltrate. Chronic RIGHT upper lobe nodule grossly stable since CT exam of 01/31/2007. Electronically Signed   By: MLavonia DanaM.D.   On: 01/24/2019 17:00   Ct Chest Wo Contrast  Result Date: 01/25/2019 CLINICAL DATA:  Lung nodule.  Smoker. EXAM: CT CHEST WITHOUT CONTRAST TECHNIQUE: Multidetector CT imaging of the chest was performed following the standard protocol without IV contrast. COMPARISON:  CT of the chest abdomen and pelvis on 05/28/2016 FINDINGS: Cardiovascular: There is atherosclerotic calcification of the coronary arteries. The heart is mildly enlarged. No pericardial effusion. There is atherosclerosis of the thoracic aorta, not associated with aneurysm. Noncontrast appearance of the pulmonary arteries is unremarkable.  Mediastinum/Nodes: Normal appearance of the esophagus. The visualized portion of the thyroid gland has a normal appearance. No mediastinal, hilar, or axillary adenopathy. Lungs/Pleura: Within the LEFT UPPER lobe there is a focal 1.0 centimeter ground-glass nodule, possibly associated with central lucency (image 29/7). Within the RIGHT UPPER lobe there is a 1.0  centimeter nodule containing central punctate calcification, previously measuring 0.8 centimeters.A 3 millimeter nodule is identified within the RIGHT UPPER lobe on image 53/7. There are scattered calcified granulomata. No focal consolidations or pleural effusions. No pulmonary edema. There is minimal scarring at the LEFT lung base. Upper Abdomen: There is a large volume of ascites in the UPPER abdomen. The liver contour is nodular and rounded, consistent with cirrhosis. Numerous varices are identified in the LEFT UPPER QUADRANT. Musculoskeletal: Remote RIGHT clavicle fracture. No suspicious lytic or blastic lesions. IMPRESSION: 1. Ground-glass nodule in the LEFT UPPER lobe, measuring 1.0 cm. Consider follow-up CT of the chest without contrast in 6-12 months. 2. RIGHT UPPER lobe solid nodule contains punctate calcifications centrally, favoring benign process now measuring 1.0 centimeters. 3. Smaller 3 millimeter nodule within the RIGHT UPPER lobe. 4. Evidence for prior granulomatous disease. 5. Coronary artery disease. 6. Remote RIGHT clavicle fracture. 7. Aortic Atherosclerosis (ICD10-I70.0). 8. Large volume ascites in the UPPER abdomen. 9. Cirrhosis. Electronically Signed   By: Nolon Nations M.D.   On: 01/25/2019 14:04   Ct Abdomen Pelvis W Contrast  Result Date: 01/24/2019 CLINICAL DATA:  Cirrhosis, abdominal swelling, ascites EXAM: CT ABDOMEN AND PELVIS WITH CONTRAST TECHNIQUE: Multidetector CT imaging of the abdomen and pelvis was performed using the standard protocol following bolus administration of intravenous contrast. CONTRAST:  168m OMNIPAQUE IOHEXOL 300 MG/ML  SOLN COMPARISON:  None. FINDINGS: Lower chest: No acute abnormality. Hepatobiliary: No solid liver abnormality is seen. Coarse contour of the liver. No gallstones, gallbladder wall thickening, or biliary dilatation. Pancreas: Unremarkable. No pancreatic ductal dilatation or surrounding inflammatory changes. Spleen: Splenomegaly, maximum  coronal span 17.3 cm Adrenals/Urinary Tract: Adrenal glands are unremarkable. Kidneys are normal, without renal calculi, solid lesion, or hydronephrosis. Bladder is unremarkable. Stomach/Bowel: Stomach is within normal limits. Appendix is not clearly visualized. No evidence of bowel wall thickening, distention, or inflammatory changes. Vascular/Lymphatic: Aortic atherosclerosis. No enlarged abdominal or pelvic lymph nodes. Reproductive: No mass or other significant abnormality. Other: Fluid containing right inguinal hernia. Large volume ascites. Musculoskeletal: No acute or significant osseous findings. IMPRESSION: 1.  Stigmata of cirrhosis, splenomegaly, and large volume ascites. 2.  Aortic atherosclerosis. Electronically Signed   By: AEddie CandleM.D.   On: 01/24/2019 20:15   UKoreaParacentesis  Result Date: 01/25/2019 INDICATION: Abdominal distention. Ascites. Request for diagnostic and therapeutic paracentesis. EXAM: ULTRASOUND GUIDED RIGHT LOWER QUADRANT PARACENTESIS MEDICATIONS: None. COMPLICATIONS: None immediate. PROCEDURE: Informed written consent was obtained from the patient after a discussion of the risks, benefits and alternatives to treatment. A timeout was performed prior to the initiation of the procedure. Initial ultrasound scanning demonstrates a large amount of ascites within the right lower abdominal quadrant. The right lower abdomen was prepped and draped in the usual sterile fashion. 1% lidocaine with epinephrine was used for local anesthesia. Following this, a 19 gauge, 7-cm, Yueh catheter was introduced. An ultrasound image was saved for documentation purposes. The paracentesis was performed. The catheter was removed and a dressing was applied. The patient tolerated the procedure well without immediate post procedural complication. FINDINGS: A total of approximately 10.8 L of clear yellow fluid was removed. Samples were sent to the laboratory as  requested by the clinical team. IMPRESSION:  Successful ultrasound-guided paracentesis yielding 10.8 liters of peritoneal fluid. Read by: Ascencion Dike PA-C Electronically Signed   By: Sandi Mariscal M.D.   On: 01/25/2019 14:42    Assessment / Plan: 1. 58 y.o. male with history of EtOH abuse presents with SOB, ascites and LE edema. CTAP showed stigmata of cirrhosis, splenomegaly and large volume ascites. S/P paracentesis 7/25, 10.8L clear yellow peritoneal fluid removed. He received 50gm IV Albumin post paracentesis. No evidence of SBP. SAAG c/w cirrhosis. Cytology pending. ECHO showed LV EF 50-60%. RV normal systolic function. Normal renal function. AST 25. ALT 9. Alk phos 66. T. Bili 2.4. up >> 1.9. -await pending lab results: Hep B core total, HeB surface ag, Hep C ab, AFP, A1AT, ANA, SMA, AMA, IgG, Ceruloplasmin and Ttg -Lasix 61m IV daily, Spironolactone 537mQD -Low Sodium diet -? Abdominal MRI to assess RUQ mass  Palpated on exam today, not identified on CTAP  2. Iron deficiency Anemia. Hg 6.3. Hct 24.8 (Hg 15 04/2013). MCV 68.3.BUN  Iron 17.  TIBC 427. Ferritin 5. Vitamin B12 508. Transfused 2 units PRBCs. Hg 7.2. Feraheme 51056mV  x 1 infused. No obvious signs of GI bleeding. FOBT negative. -will need EGD and colonoscopy to assess for EV, upper and lower GI  Bleeding although FOBT 7/24 was negative, r/o upper/lower GI malignancy. Further recommendations per Dr. PyrHilarie FredricksonPantoprazole 1m96m bid -clear liquid diet -repeat cbc in am, retic count   3. Hypokalemia. K+ 3.4 -KCL replacement per hospitalist   4. EtOH abuse, CIWA protocol in process -avoid oversedation   Further recommendations per Dr. PyrtHilarie Fredrickson   LOS: 1 day   [place]ville26/2020, 7:47 AM

## 2019-01-27 ENCOUNTER — Encounter (HOSPITAL_COMMUNITY): Payer: Self-pay | Admitting: *Deleted

## 2019-01-27 ENCOUNTER — Inpatient Hospital Stay (HOSPITAL_COMMUNITY): Payer: PPO | Admitting: Certified Registered"

## 2019-01-27 ENCOUNTER — Encounter (HOSPITAL_COMMUNITY)
Admission: EM | Disposition: A | Discharge status: Home / Self Care | Payer: Self-pay | Source: Ambulatory Visit | Attending: Internal Medicine

## 2019-01-27 DIAGNOSIS — K635 Polyp of colon: Secondary | ICD-10-CM

## 2019-01-27 DIAGNOSIS — K766 Portal hypertension: Secondary | ICD-10-CM

## 2019-01-27 DIAGNOSIS — I8510 Secondary esophageal varices without bleeding: Secondary | ICD-10-CM

## 2019-01-27 DIAGNOSIS — K2970 Gastritis, unspecified, without bleeding: Secondary | ICD-10-CM

## 2019-01-27 DIAGNOSIS — K7460 Unspecified cirrhosis of liver: Secondary | ICD-10-CM

## 2019-01-27 HISTORY — PX: ESOPHAGOGASTRODUODENOSCOPY (EGD) WITH PROPOFOL: SHX5813

## 2019-01-27 HISTORY — PX: COLONOSCOPY WITH PROPOFOL: SHX5780

## 2019-01-27 HISTORY — PX: BIOPSY: SHX5522

## 2019-01-27 HISTORY — PX: POLYPECTOMY: SHX5525

## 2019-01-27 LAB — TYPE AND SCREEN
ABO/RH(D): O POS
Antibody Screen: NEGATIVE
Unit division: 0
Unit division: 0
Unit division: 0

## 2019-01-27 LAB — BPAM RBC
Blood Product Expiration Date: 202008172359
Blood Product Expiration Date: 202008202359
Blood Product Expiration Date: 202008212359
ISSUE DATE / TIME: 202007251557
ISSUE DATE / TIME: 202007252306
ISSUE DATE / TIME: 202007261336
Unit Type and Rh: 5100
Unit Type and Rh: 5100
Unit Type and Rh: 5100

## 2019-01-27 LAB — COMPREHENSIVE METABOLIC PANEL
ALT: 11 U/L (ref 0–44)
AST: 27 U/L (ref 15–41)
Albumin: 2.7 g/dL — ABNORMAL LOW (ref 3.5–5.0)
Alkaline Phosphatase: 67 U/L (ref 38–126)
Anion gap: 10 (ref 5–15)
BUN: 6 mg/dL (ref 6–20)
CO2: 25 mmol/L (ref 22–32)
Calcium: 7.8 mg/dL — ABNORMAL LOW (ref 8.9–10.3)
Chloride: 104 mmol/L (ref 98–111)
Creatinine, Ser: 0.57 mg/dL — ABNORMAL LOW (ref 0.61–1.24)
GFR calc Af Amer: >60 mL/min (ref >60)
GFR calc non Af Amer: >60 mL/min (ref >60)
Glucose, Bld: 95 mg/dL (ref 70–99)
Potassium: 3.5 mmol/L (ref 3.5–5.1)
Sodium: 139 mmol/L (ref 135–145)
Total Bilirubin: 1.7 mg/dL — ABNORMAL HIGH (ref 0.3–1.2)
Total Protein: 6.2 g/dL — ABNORMAL LOW (ref 6.5–8.1)

## 2019-01-27 LAB — FOLATE RBC
Folate, Hemolysate: 429 ng/mL
Folate, RBC: 1810 ng/mL (ref >498)
Hematocrit: 23.7 % — ABNORMAL LOW (ref 37.5–51.0)

## 2019-01-27 LAB — HEPATITIS B CORE ANTIBODY, TOTAL: Hep B Core Total Ab: NEGATIVE

## 2019-01-27 LAB — ANTINUCLEAR ANTIBODIES, IFA: ANA Ab, IFA: NEGATIVE

## 2019-01-27 LAB — IGA: IgA: 262 mg/dL (ref 90–386)

## 2019-01-27 LAB — CBC
HCT: 27.9 % — ABNORMAL LOW (ref 39.0–52.0)
Hemoglobin: 7.7 g/dL — ABNORMAL LOW (ref 13.0–17.0)
MCH: 20.3 pg — ABNORMAL LOW (ref 26.0–34.0)
MCHC: 27.6 g/dL — ABNORMAL LOW (ref 30.0–36.0)
MCV: 73.6 fL — ABNORMAL LOW (ref 80.0–100.0)
Platelets: 183 10*3/uL (ref 150–400)
RBC: 3.79 MIL/uL — ABNORMAL LOW (ref 4.22–5.81)
RDW: 21.4 % — ABNORMAL HIGH (ref 11.5–15.5)
WBC: 3.9 10*3/uL — ABNORMAL LOW (ref 4.0–10.5)
nRBC: 0 % (ref 0.0–0.2)

## 2019-01-27 LAB — IGG: IgG (Immunoglobin G), Serum: 1820 mg/dL — ABNORMAL HIGH (ref 603–1613)

## 2019-01-27 LAB — AFP TUMOR MARKER: AFP, Serum, Tumor Marker: 1.8 ng/mL (ref 0.0–8.3)

## 2019-01-27 LAB — RETICULOCYTES
Immature Retic Fract: 31.6 % — ABNORMAL HIGH (ref 2.3–15.9)
RBC.: 3.79 MIL/uL — ABNORMAL LOW (ref 4.22–5.81)
Retic Count, Absolute: 61 10*3/uL (ref 19.0–186.0)
Retic Ct Pct: 1.6 % (ref 0.4–3.1)

## 2019-01-27 LAB — TISSUE TRANSGLUTAMINASE, IGA: Tissue Transglutaminase Ab, IgA: <2 U/mL (ref 0–3)

## 2019-01-27 LAB — HEPATITIS B SURFACE ANTIBODY, QUANTITATIVE: Hep B S AB Quant (Post): <3.1 m[IU]/mL — ABNORMAL LOW (ref >9.9)

## 2019-01-27 LAB — HEPATITIS C ANTIBODY (REFLEX): HCV Ab: >11 s/co ratio — ABNORMAL HIGH (ref 0.0–0.9)

## 2019-01-27 LAB — COMMENT2 - HEP PANEL

## 2019-01-27 LAB — CERULOPLASMIN: Ceruloplasmin: 21.7 mg/dL (ref 16.0–31.0)

## 2019-01-27 SURGERY — ESOPHAGOGASTRODUODENOSCOPY (EGD) WITH PROPOFOL
Anesthesia: Monitor Anesthesia Care

## 2019-01-27 MED ORDER — LIDOCAINE HCL (CARDIAC) PF 100 MG/5ML IV SOSY
PREFILLED_SYRINGE | INTRAVENOUS | Status: DC | PRN
  Administered 2019-01-27 (×2): 50 mg via INTRATRACHEAL

## 2019-01-27 MED ORDER — LACTATED RINGERS IV SOLN
INTRAVENOUS | Status: DC
  Administered 2019-01-27: 07:00:00 via INTRAVENOUS

## 2019-01-27 MED ORDER — NADOLOL 20 MG PO TABS
20.0000 mg | ORAL_TABLET | Freq: Every day | ORAL | Status: DC
  Administered 2019-01-27 – 2019-01-28 (×2): 20 mg via ORAL
  Filled 2019-01-27 (×2): qty 1

## 2019-01-27 MED ORDER — LACTATED RINGERS IV SOLN: INTRAVENOUS | Status: DC | PRN

## 2019-01-27 MED ORDER — GLYCOPYRROLATE 0.2 MG/ML IJ SOLN
INTRAMUSCULAR | Status: DC | PRN
  Administered 2019-01-27 (×2): 0.1 mg via INTRAVENOUS

## 2019-01-27 MED ORDER — PROPOFOL 500 MG/50ML IV EMUL
INTRAVENOUS | Status: DC | PRN
  Administered 2019-01-27: 140 ug/kg/min via INTRAVENOUS

## 2019-01-27 MED ORDER — PROPOFOL 500 MG/50ML IV EMUL
INTRAVENOUS | Status: DC | PRN
  Administered 2019-01-27: 30 mg via INTRAVENOUS
  Administered 2019-01-27 (×3): 20 mg via INTRAVENOUS

## 2019-01-27 MED ORDER — PROPOFOL 10 MG/ML IV BOLUS
INTRAVENOUS | Status: AC
  Filled 2019-01-27: qty 80

## 2019-01-27 SURGICAL SUPPLY — 25 items

## 2019-01-27 NOTE — Interval H&P Note (Signed)
History and Physical Interval Note:  01/27/2019 7:20 AM  William Whitaker  has presented today for surgery, with the diagnosis of Anemia, cirrhosis.  The various methods of treatment have been discussed with the patient and family. After consideration of risks, benefits and other options for treatment, the patient has consented to  Procedure(s): ESOPHAGOGASTRODUODENOSCOPY (EGD) WITH PROPOFOL (N/A) COLONOSCOPY WITH PROPOFOL (N/A) as a surgical intervention.  The patient's history has been reviewed, patient examined, no change in status, stable for surgery.  I have reviewed the patient's chart and labs.  Questions were answered to the patient's satisfaction.     [employer]

## 2019-01-27 NOTE — Anesthesia Preprocedure Evaluation (Signed)
Anesthesia Evaluation  Patient identified by MRN, date of birth, ID band Patient awake    Reviewed: Allergy & Precautions, NPO status , Patient's Chart, lab work & pertinent test results  Airway Mallampati: II  TM Distance: >3 FB Neck ROM: Full    Dental  (+) Teeth Intact   Pulmonary Current Smoker,    breath sounds clear to auscultation       Cardiovascular  Rhythm:Regular Rate:Normal     Neuro/Psych    GI/Hepatic   Endo/Other    Renal/GU      Musculoskeletal   Abdominal   Peds  Hematology   Anesthesia Other Findings   Reproductive/Obstetrics                             Anesthesia Physical Anesthesia Plan  ASA: III  Anesthesia Plan: MAC   Post-op Pain Management:    Induction: Intravenous  PONV Risk Score and Plan: Ondansetron  Airway Management Planned: Natural Airway and Nasal Cannula  Additional Equipment:   Intra-op Plan:   Post-operative Plan:   Informed Consent: I have reviewed the patients History and Physical, chart, labs and discussed the procedure including the risks, benefits and alternatives for the proposed anesthesia with the patient or authorized representative who has indicated his/her understanding and acceptance.       Plan Discussed with: CRNA and Anesthesiologist  Anesthesia Plan Comments:         Anesthesia Quick Evaluation

## 2019-01-27 NOTE — Op Note (Signed)
[facility] Patient Name: William Whitaker Procedure Date: 01/27/2019 MRN: [id number] Attending MD: Justice Britain , MD Date of Birth: [birth year] CSN: [id number] Age: 58 Admit Type: Inpatient Procedure:                Upper GI endoscopy Indications:              Iron deficiency anemia, Cirrhosis rule out                            esophageal varices Providers:                Justice Britain, MD, Cleda Daub, RN, Elspeth Cho Tech., Technician Referring MD:             Lajuan Lines. Pyrtle, MD, [facility] Medicines:                Monitored Anesthesia Care Complications:            No immediate complications. Estimated Blood Loss:     Estimated blood loss was minimal. Procedure:                Pre-Anesthesia Assessment:                           - Prior to the procedure, a History and Physical                            was performed, and patient medications and                            allergies were reviewed. The patient's tolerance of                            previous anesthesia was also reviewed. The risks                            and benefits of the procedure and the sedation                            options and risks were discussed with the patient.                            All questions were answered, and informed consent                            was obtained. Prior Anticoagulants: The patient has                            taken no previous anticoagulant or antiplatelet                            agents except for aspirin. ASA Grade Assessment:  III - A patient with severe systemic disease. After                            reviewing the risks and benefits, the patient was                            deemed in satisfactory condition to undergo the                            procedure.                           After obtaining informed consent, the endoscope was                            passed  under direct vision. Throughout the                            procedure, the patient's blood pressure, pulse, and                            oxygen saturations were monitored continuously. The                            [id number]-H190 ([id number]) Olympus gastroscope was                            introduced through the mouth, and advanced to the                            second part of duodenum. The upper GI endoscopy was                            accomplished without difficulty. The patient                            tolerated the procedure. Scope In: Scope Out: Findings:      No gross lesions were noted in the proximal esophagus and in the mid       esophagus.      Multiple columns of grade I and grade II varices were found in the       distal esophagus. No evidence of stigmata of recent bleeding.      The Z-line was regular and was found 40 cm from the incisors.      Diffuse portal hypertensive gastropathy was found in the cardia, in the       gastric fundus and in the gastric body. Spontaneous oozing and contact       oozing was present by scope manipulation. This stopped on it's own.      Striped moderate inflammation characterized by erythema was found in the       gastric antrum.      No gross lesions were noted in the entire examined stomach otherwise.       Biopsies were taken with a cold forceps for histology and Helicobacter       pylori testing.  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      No other gross lesions were noted in the duodenal bulb, in the first       portion of the duodenum and in the second portion of the duodenum. Impression:               - Grade I and grade II esophageal varices distally                            but no evidence of stigmata of recent bleeding..                           - Z-line regular, 40 cm from the incisors.                           - Portal hypertensive gastropathy - evidence of                             contact oozing noted. Gastritis in the antrum.                            Biopsied for HP.                           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 Otherwise, no gross lesions in the duodenal                            bulb, in the first portion of the duodenum and in                            the second portion of the duodenum. Moderate Sedation:      Not Applicable - Patient had care per Anesthesia. Recommendation:           - Proceed to scheduled colonoscopy.                           - Observe patient's clinical course.                           - Await pathology results.                           - Send TTG-IgA & IgA level to rule out Celiac                            Disease (although endoscopically did not have                            evidence of this).                           - He will benefit from  initiation of a                            non-selective betablocker such as Nadolol or                            Propanolol or could consider Carvedilol. Overall,                            our goal for primary prevention of varcieal                            bleeding is to get a resting HR near 60 if possible                            if patient able to tolerate and decrease BP as                            safely as possible. However, in chart has notation                            of albuterol use. I would recommend starting                            Nadolol 20 mg and monitoring HR and orthostatics.                            This can be titrated as an outpatient if necessary.                           - Continue Lasix 20 mg and Spironolactone 50 mg                            today. May need to increase prior to discharge or                            as outpatient.                           - Daily standing weights.                           - The findings and recommendations were discussed                             with the patient.                           - The findings and recommendations were discussed                            with the referring physician. Procedure Code(s):        --- Professional ---  62703, Esophagogastroduodenoscopy, flexible,                            transoral; with biopsy, single or multiple Diagnosis Code(s):        --- Professional ---                           K74.60, Unspecified cirrhosis of liver                           I85.10, Secondary esophageal varices without                            bleeding                           K76.6, Portal hypertension                           K31.89, Other diseases of stomach and duodenum                           K29.70, Gastritis, unspecified, without bleeding                           D50.9, Iron deficiency anemia, unspecified CPT copyright 2019 [employer]. All rights reserved. The codes documented in this report are preliminary and upon coder review may  be revised to meet current compliance requirements. Justice Britain, MD 01/27/2019 9:34:40 AM Number of Addenda: 0

## 2019-01-27 NOTE — Op Note (Signed)
[facility] Patient Name: William Whitaker Procedure Date: 01/27/2019 MRN: [id number] Attending MD: Justice Britain , MD Date of Birth: August 03, 1960 CSN: [id number] Age: 58 Admit Type: Inpatient Procedure:                Colonoscopy Indications:              This is the patient's first colonoscopy, Iron                            deficiency anemia Providers:                Justice Britain, MD, Cleda Daub, RN, Elspeth Cho Tech., Technician Referring MD:             Lajuan Lines. Pyrtle, MD, [facility] Medicines:                Monitored Anesthesia Care Complications:            No immediate complications. Estimated Blood Loss:     Estimated blood loss was minimal. Procedure:                Pre-Anesthesia Assessment:                           - Prior to the procedure, a History and Physical                            was performed, and patient medications and                            allergies were reviewed. The patient's tolerance of                            previous anesthesia was also reviewed. The risks                            and benefits of the procedure and the sedation                            options and risks were discussed with the patient.                            All questions were answered, and informed consent                            was obtained. Prior Anticoagulants: The patient has                            taken no previous anticoagulant or antiplatelet                            agents except for aspirin. ASA Grade Assessment:  III - A patient with severe systemic disease. After                            reviewing the risks and benefits, the patient was                            deemed in satisfactory condition to undergo the                            procedure.                           After obtaining informed consent, the colonoscope                            was passed  under direct vision. Throughout the                            procedure, the patient's blood pressure, pulse, and                            oxygen saturations were monitored continuously. The                            CF-HQ190L ([id number]) Olympus colonoscope was                            introduced through the anus and advanced to the 5                            cm into the ileum. The colonoscopy was performed                            without difficulty. The patient tolerated the                            procedure. The quality of the bowel preparation was                            adequate. The terminal ileum, ileocecal valve,                            appendiceal orifice, and rectum were photographed. Scope In: 8:03:03 AM Scope Out: 8:32:31 AM Scope Withdrawal Time: 0 hours 20 minutes 30 seconds  Total Procedure Duration: 0 hours 29 minutes 28 seconds  Findings:      Hemorrhoids were found on perianal exam.      The terminal ileum and ileocecal valve appeared normal.      Four sessile polyps were found in the sigmoid colon (1), transverse       colon (1), ascending colon (1), and cecum (1). The polyps were 2 to 5 mm       in size. These polyps were removed with a cold snare. Resection and       retrieval were complete.      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      Non-bleeding non-thrombosed internal hemorrhoids were found during       retroflexion, during perianal exam and during digital exam. The       hemorrhoids were Grade II (internal hemorrhoids that prolapse but reduce       spontaneously). Impression:               - Hemorrhoids found on perianal exam.                           - The examined portion of the ileum was normal.                           - Four 2 to 5 mm  polyps in the sigmoid colon, in                            the transverse colon, in the ascending colon and in                            the cecum, removed with a cold snare. Resected and                            retrieved.                           - Granularity and friability of the colon was noted                            - scope passage caused some contact oozing in areas.                           - Non-bleeding non-thrombosed internal hemorrhoids. Moderate Sedation:      Not Applicable - Patient had care per Anesthesia. Recommendation:           - The patient will be observed post-procedure,                            until all discharge criteria are met.                           - Discharge patient to home.                           - Resume previous diet.                           - Continue present medications.                           - Await pathology results.                           -  Repeat colonoscopy 5/7 years for surveillance                            based on pathology results and findings of                            adenomatous tissue.                           - The findings and recommendations were discussed                            with the patient.                           - The findings and recommendations were discussed                            with the referring physician. Procedure Code(s):        --- Professional ---                           [phone], Colonoscopy, flexible; with removal of                            tumor(s), polyp(s), or other lesion(s) by snare                            technique Diagnosis Code(s):        --- Professional ---                           K64.1, Second degree hemorrhoids                           K63.5, Polyp of colon                           K63.89, Other specified diseases of intestine                           D50.9, Iron deficiency anemia, unspecified CPT copyright 2019 American [employer]. All  rights reserved. The codes documented in this report are preliminary and upon coder review may  be revised to meet current compliance requirements. Justice Britain, MD 01/27/2019 9:42:27 AM Number of Addenda: 0

## 2019-01-27 NOTE — Progress Notes (Signed)
PROGRESS NOTE    William Whitaker  ATF:[id number] DOB: 1961-05-30 DOA: 01/24/2019 PCP: Colon Branch, MD   Brief Narrative: 58 year old male patient of Dr. Larose Kells, who was seen in the office for increasing swelling in the leg ankle and abdomen along with increasing shortness of breath, going on for 2 to 3 weeks, with prior history of heavy alcohol use currently consuming 1-3 beers on a daily basis last few months, smoker, D, chronic bronchitis using inhaler, anxiety sent to the ER for evaluation while noted to have hemoglobin of 8.2 g, and also significant ascites and transferred to [facility] long for blood transfusion and acetic paracentesis. Patient reports no prior colonoscopy or endoscopy. He had:CT scan abd/ pelvis:Stigmata of cirrhosis, splenomegaly, and large volume ascites.YHC:WCBJSEG bronchitic changes without infiltrate/Chronic RIGHT upper lobe nodule grossly stable since CT exam of Lab with hyponatremia, hypokalemia total bili 1.6 ammonia 15 EtOH less than 10 UDS negative FOBT negative, Wbc 4.0, hgb 6.2, Plt 202,Mcv 67.2, rdw 19.4 Urinalysis prot 30, wbc 6-10, rbc 0-5, BNP 92.8 Patient given lasix 20mg  iv x1, and benadryl 25mg  iv x1 in the ED COVID 19 rapid negative Underwent abdominal paracentesis with 10.5 L fluid removal, GI following and status post EGD colonoscopy 7/27.   Subjective: Examined this morning.  Status post endoscopy and colonoscopy. Feeling well no new complaints.  No nausea vomiting chest pain.  Wanting to eat.  Assessment & Plan:  Severe microcytic anemia, symptomatic: Work-up shows iron deficiency anemia, FOBT was negative.  EGD and colonoscopy done this morning has grade 1 and 2 esophageal varices without stigmata of bleeding, and will portal hypertensive gastropathy with contact oozing and gastritis-which is likely the cause of intermittent bleeding and iron deficiency anemia.  Continue PPI twice daily, iron replacement.  Monitor hemoglobin and follow further serology  work-up. So fat 3 unit prbc transfused. Recent Labs  Lab 01/24/19 1627 01/25/19 0544 01/26/19 0513 01/26/19 2017 01/27/19 0437  HGB 6.2* 6.3* 7.2* 8.7* 7.7*  HCT 24.0* 24.8* 27.0* 31.5* 17.6*   Alcoholic cirrhosis with ascites: s/p 10.8 litre abdominal paracentesis w iv albumin. Ascites and leg edema is likely related to alcoholic cirrhosis  Cytology pending, no evidence of SBP. Work-up in progress with hepatitis serology, AFP, A1 AT, ANA, SMA, AMA, IgG, ceruloplasmin and TTG-as per GI. TSH-nl, cortisol-11, TTE shows normal EF no acute finding ruling out cardiac etiology.Continue Lasix and Aldactone.  EGD showed esophageal varices and portal gastropathy, added nadolol 20 mg daily- monitor weight, blood pressure heart rate and uptitrate meds slowly.  Filed Weights   01/26/19 0615 01/27/19 0659 01/27/19 0749  Weight: 90.2 kg 90.2 kg 87.2 kg   Alcohol abuse patient reports he used to do heavy alcohol and for past few months has cut down to 2-3 beers daily.  At risk for withdrawal- S/P Librium and ON CIWA Protocol.  Tachycardia,? early etoh withdrawal, could be secondary to anemia.    Rate stable.  Monitor  Lung nodule CT chest- nodule of 1.0 cm. Consider follow-up CT of the chest without contrast in 6-12 months. RIGHT UPPER lobe solid nodule contains punctate calcifications centrally, favoring benign process now measuring 1.0 centimeters.  Hypocalcemia/hypomagnesemia and hypokalemia:  Improved. Cont magnesium oxide  [facility] neg and sent out on admisison also neg  DVT prophylaxis: SCD Code Status: FULL Family Communication: plan of care discussed with patient in detail.  I called patient's son with his permission and updated in detail. Disposition Plan: Discussed with gastroenterology, monitor the  response on new medication, anticipate disposition next 24 to 48 hours once okay with GI.    Consultants:  GI  Procedures: 7/27: EGD: -Grade I and grade II esophageal  varices distally but no evidence of stigmata of recent bleeding.. -Z-line regular, 40 cm from the incisors. -Portal hypertensive gastropathy - evidence of contact oozing noted. Gastritis in the antrum. Biopsied for HP. 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 Otherwise, no gross lesions in the duodenal bulb, in the first portion of the duodenum and in the second portion of the Duodenum.  Colonoscopy: -Hemorrhoids found on perianal exam. -The examined portion of the ileum was normal. -Four 2 to 5 mm polyps in the sigmoid colon, in the transverse colon, in the ascending. colon and in the cecum, removed with a cold snare. Resected and retrieved. -Granularity and friability of the colon was noted - scope passage caused some contact. oozing in areas. -Non-bleeding non-thrombosed internal hemorrhoids.  TTE 7/25: 1. The left ventricle has normal systolic function, with an ejection fraction of 55-60%. The cavity size was normal. Indeterminate diastolic filling due to E-A fusion.  2. The right ventricle has normal systolic function. The cavity was normal. There is no increase in right ventricular wall thickness. Right ventricular systolic pressure could not be assessed.  3. Mild thickening of the mitral valve leaflet.  4. The aortic valve is tricuspid. No stenosis of the aortic valve.  5. The aortic root and ascending aorta are normal in size and structure.  6. There is redundancy of the interatrial septum.  7. The inferior vena cava was normal in size with <50% respiratory variability.  Ultrasound-guided abdominal paracentesis 7/25: Successful ultrasound-guided paracentesis yielding 10.8 liters of peritoneal fluid.  CT scan chest without contrast: 1. Ground-glass nodule in the LEFT UPPER lobe, measuring 1.0 cm. Consider follow-up CT of the chest without contrast in 6-12 months. 2. RIGHT UPPER lobe solid nodule contains punctate calcifications centrally, favoring benign process  now measuring 1.0 centimeters. 3. Smaller 3 millimeter nodule within the RIGHT UPPER lobe. 4. Evidence for prior granulomatous disease. 5. Coronary artery disease. 6. Remote RIGHT clavicle fracture. 7. Aortic Atherosclerosis (ICD10-I70.0). 8. Large volume ascites in the UPPER abdomen. 9. Cirrhosis  Microbiology:  Antimicrobials: Anti-infectives (From admission, onward)   None       Objective: Vitals:   01/27/19 0845 01/27/19 0850 01/27/19 0900 01/27/19 0933  BP: 123/78 120/66 133/74 127/83  Pulse: 86 81 83 85  Resp: (!) 30 20 20 16   Temp: 97.6 F (36.4 C)   97.7 F (36.5 C)  TempSrc: Oral   Oral  SpO2: 100% 98% 96% 98%  Weight:      Height:        Intake/Output Summary (Last 24 hours) at 01/27/2019 0955 Last data filed at 01/27/2019 0843 Gross per 24 hour  Intake 773.33 ml  Output 1095 ml  Net -321.67 ml   Filed Weights   01/26/19 0615 01/27/19 0659 01/27/19 0749  Weight: 90.2 kg 90.2 kg 87.2 kg   Weight change: 0.025 kg  Body mass index is 26.08 kg/m.  Intake/Output from previous day: 07/26 0701 - 07/27 0700 In: 694.2 [I.V.:19.2; Blood:675] Out: 1095 [Urine:1095] Intake/Output this shift: Total I/O In: 79.2 [I.V.:79.2] Out: 0   Examination:  General exam:AAO,NAD,weak/frail. HEENT:Oral mucosa moist, Ear/Nose WNL grossly, dentition normal. Respiratory system: Diminished at the base,no wheezing or crackles, NT,no use of accessory muscle Cardiovascular system: S1 & S2 +, No JVD, regular RR. Gastrointestinal system: Abdomen soft, moderately distended  NT, BS+ Nervous System:Alert, awake, moving extremities and grossly nonfocal Extremities: edema +, distal peripheral pulses palpable.  Skin: No rashes,no icterus. MSK: Normal muscle bulk,tone, power  Medications:  Scheduled Meds:  albuterol  2.5 mg Nebulization BID   folic acid  1 mg Oral Daily   furosemide  20 mg Intravenous Daily   LORazepam  0-4 mg Intravenous Q12H   multivitamin with  minerals  1 tablet Oral Daily   nadolol  20 mg Oral Daily   pantoprazole (PROTONIX) IV  40 mg Intravenous Q12H   potassium chloride  20 mEq Oral Daily   sodium chloride flush  3 mL Intravenous Q12H   spironolactone  50 mg Oral Daily   thiamine  100 mg Oral Daily   Or   thiamine  100 mg Intravenous Daily   Continuous Infusions:  sodium chloride     ferumoxytol Stopped (01/25/19 2118)    Data Reviewed: I have personally reviewed following labs and imaging studies  CBC: Recent Labs  Lab 01/24/19 1627 01/25/19 0544 01/26/19 0513 01/26/19 2017 01/27/19 0437  WBC 4.0 4.5 3.9*  --  3.9*  NEUTROABS 2.5  --   --   --   --   HGB 6.2* 6.3* 7.2* 8.7* 7.7*  HCT 24.0* 24.8* 27.0* 31.8* 27.9*  MCV 67.2* 68.3* 71.6*  --  73.6*  PLT 202 214 176  --  830   Basic Metabolic Panel: Recent Labs  Lab 01/24/19 1627 01/25/19 0523 01/25/19 0544 01/26/19 0513 01/27/19 0437  NA 131*  --  133* 136 139  K 3.0*  --  3.2* 3.4* 3.5  CL 93*  --  95* 98 104  CO2 25  --  28 29 25   GLUCOSE 106*  --  99 98 95  BUN 7  --  7 7 6   CREATININE 0.62  --  0.62 0.54* 0.57*  CALCIUM 7.7*  --  7.8* 7.6* 7.8*  MG  --  1.3*  --   --   --    GFR: Estimated Creatinine Clearance: 109.1 mL/min (A) (by C-G formula based on SCr of 0.57 mg/dL (L)). Liver Function Tests: Recent Labs  Lab 01/24/19 1627 01/25/19 0544 01/26/19 0513 01/27/19 0437  AST 37 38 25 27  ALT 11 12 9 11   ALKPHOS 90 89 66 67  BILITOT 1.6* 1.9* 2.4* 1.7*  PROT 7.6 8.1 6.5 6.2*  ALBUMIN 3.0* 3.2* 2.9* 2.7*   Recent Labs  Lab 01/24/19 1627  LIPASE 35   Recent Labs  Lab 01/24/19 1657  AMMONIA 15   Coagulation Profile: Recent Labs  Lab 01/24/19 1627  INR 1.3*   Cardiac Enzymes: No results for input(s): CKTOTAL, CKMB, CKMBINDEX, TROPONINI in the last 168 hours. BNP (last 3 results) No results for input(s): PROBNP in the last 8760 hours. HbA1C: No results for input(s): HGBA1C in the last 72 hours. CBG: No results  for input(s): GLUCAP in the last 168 hours. Lipid Profile: No results for input(s): CHOL, HDL, LDLCALC, TRIG, CHOLHDL, LDLDIRECT in the last 72 hours. Thyroid Function Tests: Recent Labs    01/25/19 0544  TSH 3.002   Anemia Panel: Recent Labs    01/25/19 0544 01/27/19 0437  VITAMINB12 508  --   FERRITIN 5*  --   TIBC 427  --   IRON 17*  --   RETICCTPCT  --  1.6   Sepsis Labs: No results for input(s): PROCALCITON, LATICACIDVEN in the last 168 hours.  Recent Results (from the past 240  hour(s))  SARS Coronavirus 2 (Performed in [facility] hospital lab)     Status: None   Collection Time: 01/24/19  9:36 PM   Specimen: Nasopharyngeal Swab  Result Value Ref Range Status   SARS Coronavirus 2 NEGATIVE NEGATIVE Final    Comment: (NOTE) If result is NEGATIVE SARS-CoV-2 target nucleic acids are NOT DETECTED. The SARS-CoV-2 RNA is generally detectable in upper and lower  respiratory specimens during the acute phase of infection. The lowest  concentration of SARS-CoV-2 viral copies this assay can detect is 250  copies / mL. A negative result does not preclude SARS-CoV-2 infection  and should not be used as the sole basis for treatment or other  patient management decisions.  A negative result may occur with  improper specimen collection / handling, submission of specimen other  than nasopharyngeal swab, presence of viral mutation(s) within the  areas targeted by this assay, and inadequate number of viral copies  (<250 copies / mL). A negative result must be combined with clinical  observations, patient history, and epidemiological information. If result is POSITIVE SARS-CoV-2 target nucleic acids are DETECTED. The SARS-CoV-2 RNA is generally detectable in upper and lower  respiratory specimens dur ing the acute phase of infection.  Positive  results are indicative of active infection with SARS-CoV-2.  Clinical  correlation with patient history and other diagnostic information is    necessary to determine patient infection status.  Positive results do  not rule out bacterial infection or co-infection with other viruses. If result is PRESUMPTIVE POSTIVE SARS-CoV-2 nucleic acids MAY BE PRESENT.   A presumptive positive result was obtained on the submitted specimen  and confirmed on repeat testing.  While 2019 novel coronavirus  (SARS-CoV-2) nucleic acids may be present in the submitted sample  additional confirmatory testing may be necessary for epidemiological  and / or clinical management purposes  to differentiate between  SARS-CoV-2 and other Sarbecovirus currently known to infect humans.  If clinically indicated additional testing with an alternate test  methodology [phone]) is advised. The SARS-CoV-2 RNA is generally  detectable in upper and lower respiratory sp ecimens during the acute  phase of infection. The expected result is Negative. Fact Sheet for Patients:  StrictlyIdeas.no Fact Sheet for Healthcare Providers: BankingDealers.co.za This test is not yet approved or cleared by the [place] FDA and has been authorized for detection and/or diagnosis of SARS-CoV-2 by FDA under an Emergency Use Authorization (EUA).  This EUA will remain in effect (meaning this test can be used) for the duration of the COVID-19 declaration under Section 564(b)(1) of the Act, 21 U.S.C. section 360bbb-3(b)(1), unless the authorization is terminated or revoked sooner. Performed at [facility], [address] [place], [place] [address]   Novel Coronavirus, NAA (hospital order; send-out to ref lab)     Status: None   Collection Time: 01/25/19  4:22 AM  Result Value Ref Range Status   SARS-CoV-2, NAA NOT DETECTED NOT DETECTED Final    Comment: (NOTE) This test was developed and its performance characteristics determined by [employer]. This test has not been FDA cleared or approved. This test has been  authorized by FDA under an Emergency Use Authorization (EUA). This test is only authorized for the duration of time the declaration that circumstances exist justifying the authorization of the emergency use of in vitro diagnostic tests for detection of SARS-CoV-2 virus and/or diagnosis of COVID-19 infection under section 564(b)(1) of the Act, 21 U.S.C. 580DXI-3(J)(8), unless the authorization is  terminated or revoked sooner. When diagnostic testing is negative, the possibility of a false negative result should be considered in the context of a patient's recent exposures and the presence of clinical signs and symptoms consistent with COVID-19. An individual without symptoms of COVID-19 and who is not shedding SARS-CoV-2 virus would expect to have a negative (not detected) result in this assay. Performed  At: [facility] [place], [place] [id number] Rush Farmer MD VP:[id number]    [place]  Final    Comment: Performed at [facility] [address], [place], [place] [address]  Culture, body fluid-bottle     Status: None (Preliminary result)   Collection Time: 01/25/19  2:03 PM   Specimen: Ascitic  Result Value Ref Range Status   Specimen Description ASCITIC  Final   Special Requests NONE  Final   Culture   Final    NO GROWTH 2 DAYS Performed at [facility] [address] [place], [place] [address]    Report Status PENDING  Incomplete  Gram stain     Status: None   Collection Time: 01/25/19  2:03 PM   Specimen: Ascitic  Result Value Ref Range Status   Specimen Description ASCITIC  Final   Special Requests NONE  Final   Gram Stain   Final    RARE WBC PRESENT, PREDOMINANTLY MONONUCLEAR NO ORGANISMS SEEN Performed at [facility], [place] [address] [place], [place] [address]    Report Status 01/26/2019 FINAL  Final      Radiology Studies: Ct Chest Wo Contrast  Result Date:  01/25/2019 CLINICAL DATA:  Lung nodule.  Smoker. EXAM: CT CHEST WITHOUT CONTRAST TECHNIQUE: Multidetector CT imaging of the chest was performed following the standard protocol without IV contrast. COMPARISON:  CT of the chest abdomen and pelvis on 05/28/2016 FINDINGS: Cardiovascular: There is atherosclerotic calcification of the coronary arteries. The heart is mildly enlarged. No pericardial effusion. There is atherosclerosis of the thoracic aorta, not associated with aneurysm. Noncontrast appearance of the pulmonary arteries is unremarkable. Mediastinum/Nodes: Normal appearance of the esophagus. The visualized portion of the thyroid gland has a normal appearance. No mediastinal, hilar, or axillary adenopathy. Lungs/Pleura: Within the LEFT UPPER lobe there is a focal 1.0 centimeter ground-glass nodule, possibly associated with central lucency (image 29/7). Within the RIGHT UPPER lobe there is a 1.0 centimeter nodule containing central punctate calcification, previously measuring 0.8 centimeters.A 3 millimeter nodule is identified within the RIGHT UPPER lobe on image 53/7. There are scattered calcified granulomata. No focal consolidations or pleural effusions. No pulmonary edema. There is minimal scarring at the LEFT lung base. Upper Abdomen: There is a large volume of ascites in the UPPER abdomen. The liver contour is nodular and rounded, consistent with cirrhosis. Numerous varices are identified in the LEFT UPPER QUADRANT. Musculoskeletal: Remote RIGHT clavicle fracture. No suspicious lytic or blastic lesions. IMPRESSION: 1. Ground-glass nodule in the LEFT UPPER lobe, measuring 1.0 cm. Consider follow-up CT of the chest without contrast in 6-12 months. 2. RIGHT UPPER lobe solid nodule contains punctate calcifications centrally, favoring benign process now measuring 1.0 centimeters. 3. Smaller 3 millimeter nodule within the RIGHT UPPER lobe. 4. Evidence for prior granulomatous disease. 5. Coronary artery disease. 6.  Remote RIGHT clavicle fracture. 7. Aortic Atherosclerosis (ICD10-I70.0). 8. Large volume ascites in the UPPER abdomen. 9. Cirrhosis. Electronically Signed   By: Nolon Nations M.D.   On: 01/25/2019 14:04   US Paracentesis  Result Date: 01/25/2019 INDICATION:  Abdominal distention. Ascites. Request for diagnostic and therapeutic paracentesis. EXAM: ULTRASOUND GUIDED RIGHT LOWER QUADRANT PARACENTESIS MEDICATIONS: None. COMPLICATIONS: None immediate. PROCEDURE: Informed written consent was obtained from the patient after a discussion of the risks, benefits and alternatives to treatment. A timeout was performed prior to the initiation of the procedure. Initial ultrasound scanning demonstrates a large amount of ascites within the right lower abdominal quadrant. The right lower abdomen was prepped and draped in the usual sterile fashion. 1% lidocaine with epinephrine was used for local anesthesia. Following this, a 19 gauge, 7-cm, Yueh catheter was introduced. An ultrasound image was saved for documentation purposes. The paracentesis was performed. The catheter was removed and a dressing was applied. The patient tolerated the procedure well without immediate post procedural complication. FINDINGS: A total of approximately 10.8 L of clear yellow fluid was removed. Samples were sent to the laboratory as requested by the clinical team. IMPRESSION: Successful ultrasound-guided paracentesis yielding 10.8 liters of peritoneal fluid. Read by: Ascencion Dike PA-C Electronically Signed   By: Sandi Mariscal M.D.   On: 01/25/2019 14:42      LOS: 2 days   Time spent: More than 50% of that time was spent in counseling and/or coordination of care.  Antonieta Pert, MD [facility]  01/27/2019, 9:55 AM

## 2019-01-27 NOTE — Transfer of Care (Signed)
Immediate Anesthesia Transfer of Care Note  Patient: [name]  Procedure(s) Performed: ESOPHAGOGASTRODUODENOSCOPY (EGD) WITH PROPOFOL (N/A ) COLONOSCOPY WITH PROPOFOL (N/A ) BIOPSY POLYPECTOMY  Patient Location: Endoscopy Unit  Anesthesia Type:MAC  Level of Consciousness: oriented, drowsy and patient cooperative  Airway & Oxygen Therapy: Patient Spontanous Breathing and Patient connected to nasal cannula oxygen  Post-op Assessment: Report given to RN and Post -op Vital signs reviewed and stable  Post vital signs: Reviewed and stable  Last Vitals:  Vitals Value Taken Time  BP    Temp    Pulse    Resp    SpO2      Last Pain:  Vitals:   01/27/19 0659  TempSrc: Oral  PainSc: 0-No pain      Patients Stated Pain Goal: 2 (97/98/92 1194)  Complications: No apparent anesthesia complications

## 2019-01-27 NOTE — Anesthesia Postprocedure Evaluation (Signed)
Anesthesia Post Note  Patient: William Whitaker  Procedure(s) Performed: ESOPHAGOGASTRODUODENOSCOPY (EGD) WITH PROPOFOL (N/A ) COLONOSCOPY WITH PROPOFOL (N/A ) BIOPSY POLYPECTOMY     Patient location during evaluation: Endoscopy Anesthesia Type: MAC Level of consciousness: awake and alert Pain management: pain level controlled Vital Signs Assessment: post-procedure vital signs reviewed and stable Respiratory status: spontaneous breathing, nonlabored ventilation, respiratory function stable and patient connected to nasal cannula oxygen Cardiovascular status: stable and blood pressure returned to baseline Postop Assessment: no apparent nausea or vomiting Anesthetic complications: no    Last Vitals:  Vitals:   01/27/19 0900 01/27/19 0933  BP: 133/74 127/83  Pulse: 83 85  Resp: 20 16  Temp:  36.5 C  SpO2: 96% 98%    Last Pain:  Vitals:   01/27/19 0933  TempSrc: Oral  PainSc:                  Sharlett Lienemann COKER

## 2019-01-28 ENCOUNTER — Encounter: Payer: Self-pay | Admitting: Gastroenterology

## 2019-01-28 ENCOUNTER — Encounter (HOSPITAL_COMMUNITY): Payer: Self-pay | Admitting: Gastroenterology

## 2019-01-28 ENCOUNTER — Telehealth: Payer: Self-pay

## 2019-01-28 LAB — COMPREHENSIVE METABOLIC PANEL
ALT: 11 U/L (ref 0–44)
AST: 34 U/L (ref 15–41)
Albumin: 2.7 g/dL — ABNORMAL LOW (ref 3.5–5.0)
Alkaline Phosphatase: 71 U/L (ref 38–126)
Anion gap: 5 (ref 5–15)
BUN: 11 mg/dL (ref 6–20)
CO2: 27 mmol/L (ref 22–32)
Calcium: 8 mg/dL — ABNORMAL LOW (ref 8.9–10.3)
Chloride: 104 mmol/L (ref 98–111)
Creatinine, Ser: 0.62 mg/dL (ref 0.61–1.24)
GFR calc Af Amer: >60 mL/min (ref >60)
GFR calc non Af Amer: >60 mL/min (ref >60)
Glucose, Bld: 94 mg/dL (ref 70–99)
Potassium: 4 mmol/L (ref 3.5–5.1)
Sodium: 136 mmol/L (ref 135–145)
Total Bilirubin: 1.4 mg/dL — ABNORMAL HIGH (ref 0.3–1.2)
Total Protein: 6.4 g/dL — ABNORMAL LOW (ref 6.5–8.1)

## 2019-01-28 LAB — ALPHA-1 ANTITRYPSIN PHENOTYPE: A-1 Antitrypsin, Ser: 171 mg/dL (ref 101–187)

## 2019-01-28 LAB — CBC
HCT: 30.4 % — ABNORMAL LOW (ref 39.0–52.0)
Hemoglobin: 8.1 g/dL — ABNORMAL LOW (ref 13.0–17.0)
MCH: 19.8 pg — ABNORMAL LOW (ref 26.0–34.0)
MCHC: 26.6 g/dL — ABNORMAL LOW (ref 30.0–36.0)
MCV: 74.3 fL — ABNORMAL LOW (ref 80.0–100.0)
Platelets: 225 10*3/uL (ref 150–400)
RBC: 4.09 MIL/uL — ABNORMAL LOW (ref 4.22–5.81)
RDW: 22.5 % — ABNORMAL HIGH (ref 11.5–15.5)
WBC: 5 10*3/uL (ref 4.0–10.5)
nRBC: 0 % (ref 0.0–0.2)

## 2019-01-28 LAB — ANTI-SMOOTH MUSCLE ANTIBODY, IGG: F-Actin IgG: 20 Units — ABNORMAL HIGH (ref 0–19)

## 2019-01-28 LAB — TISSUE TRANSGLUTAMINASE, IGA: Tissue Transglutaminase Ab, IgA: <2 U/mL (ref 0–3)

## 2019-01-28 LAB — MITOCHONDRIAL ANTIBODIES: Mitochondrial M2 Ab, IgG: <20 Units (ref 0.0–20.0)

## 2019-01-28 LAB — HEPATITIS B SURFACE ANTIGEN: Hepatitis B Surface Ag: NEGATIVE

## 2019-01-28 LAB — IGA: IgA: 289 mg/dL (ref 90–386)

## 2019-01-28 MED ORDER — SPIRONOLACTONE 50 MG PO TABS: 50.0000 mg | ORAL_TABLET | Freq: Every day | ORAL | 1 refills | Status: DC

## 2019-01-28 MED ORDER — THIAMINE HCL 100 MG PO TABS: 100.0000 mg | ORAL_TABLET | Freq: Every day | ORAL | 1 refills | Status: DC

## 2019-01-28 MED ORDER — FUROSEMIDE 20 MG PO TABS: 20.0000 mg | ORAL_TABLET | Freq: Every day | ORAL | 11 refills | Status: DC

## 2019-01-28 MED ORDER — FOLIC ACID 1 MG PO TABS: 1.0000 mg | ORAL_TABLET | Freq: Every day | ORAL | 1 refills | Status: DC

## 2019-01-28 MED ORDER — NADOLOL 20 MG PO TABS: 20.0000 mg | ORAL_TABLET | Freq: Every day | ORAL | 1 refills | Status: DC

## 2019-01-28 MED ORDER — POTASSIUM CHLORIDE CRYS ER 20 MEQ PO TBCR: 20.0000 meq | EXTENDED_RELEASE_TABLET | Freq: Every day | ORAL | 0 refills | Status: DC

## 2019-01-28 MED ORDER — PANTOPRAZOLE SODIUM 40 MG PO TBEC: 40.0000 mg | DELAYED_RELEASE_TABLET | Freq: Every day | ORAL | 2 refills | Status: DC

## 2019-01-28 NOTE — Progress Notes (Signed)
Patient ID: William Whitaker, male   DOB: 01-31-1961, 58 y.o.   MRN: [id number]     Progress Note   Subjective  CC: iron deficiency anemia, cirrhosis Day # 4   Patient says he feels good, and is ready to go home.  He has been eating without difficulty and has been up and around in the room.  His abdomen feels much better post paracentesis. Per nursing he has not had any overt signs of EtOH withdrawal, and is mentating well.   EGD -  Grade 1 and 2 varices, diffuse  portal gastropathy with oozing- bx P  Colon - 4 sessile polyps removed, non bleeding int hemorrhoids- path P  HGB 8.1 stable Hep C AB + Hepatic markers negative - ASMA  Pend Cytology from paracentesis- Pend  Objective   Vital signs in last 24 hours: Temp:  [97.7 F (36.5 C)-98.4 F (36.9 C)] 97.9 F (36.6 C) (07/28 0432) Pulse Rate:  [64-85] 70 (07/28 0432) Resp:  [16-22] 18 (07/28 0432) BP: (105-133)/(64-83) 118/70 (07/28 0432) SpO2:  [94 %-98 %] 94 % (07/28 0432) Weight:  [88.8 kg] 88.8 kg (07/28 0432) Last BM Date: 01/27/19 General:    white male in NAD Heart:  Regular rate and rhythm; no murmurs Lungs: Respirations even and unlabored, lungs CTA bilaterally Abdomen:  Soft, nontender, he has a large amount of non-tense ascites, and liver palpable in the right upper quadrant ,normal bowel sounds.  No current active leaking from paracentesis site Extremities: 1+ edema bilateral ankles Neurologic:  Alert and oriented,  grossly normal neurologically. Psych:  Cooperative. Normal mood and affect.  Intake/Output from previous day: 07/27 0701 - 07/28 0700 In: 319.2 [P.O.:240; I.V.:79.2] Out: 0  Intake/Output this shift: No intake/output data recorded.  Lab Results: Recent Labs    01/26/19 0513 01/26/19 2017 01/27/19 0437 01/28/19 0420  WBC 3.9*  --  3.9* 5.0  HGB 7.2* 8.7* 7.7* 8.1*  HCT 27.0* 31.8* 27.9* 30.4*  PLT 176  --  183 225   BMET Recent Labs    01/26/19 0513 01/27/19 0437 01/28/19 0420  NA 136  139 136  K 3.4* 3.5 4.0  CL 98 104 104  CO2 29 25 27   GLUCOSE 98 95 94  BUN 7 6 11   CREATININE 0.54* 0.57* 0.62  CALCIUM 7.6* 7.8* 8.0*   LFT Recent Labs    01/28/19 0420  PROT 6.4*  ALBUMIN 2.7*  AST 34  ALT 11  ALKPHOS 71  BILITOT 1.4*   PT/INR No results for input(s): LABPROT, INR in the last 72 hours.  Studies/Results: No results found.     Assessment / Plan:    #57 59 year old white male admitted after presenting to PCP with significant abdominal distention, lower extremity edema and some shortness of breath which had come on over the previous few weeks.  Work-up here is consistent with decompensated cirrhosis with ascites, peripheral edema, and 1-2+ esophageal varices and diffuse portal gastropathy on EGD.  Cirrhosis was felt likely secondary to EtOH.  Hepatitis C antibody has also returned positive, will check hep C quant. Certainly combination of EtOH and hep C are known to increase risk of cirrhosis  Current MELD = 10  Patient has been started on nadolol for portal hypertension , On low-dose Lasix 20 mg and Aldactone 50 mg for ascites and peripheral edema He is status post large-volume paracentesis, cytology pending   #2 Severe iron deficiency anemia-likely secondary to chronic blood loss from portal gastropathy Received IV Feraheme  x1, and will need to be scheduled for a second Feraheme infusion next week  Hemoglobin stable  #3 colon polyps-status post polypectomy x4 yesterday-Path pending  Plan; Patient is stable from GI standpoint for discharge today.  We will arrange outpatient follow-up, hopefully with Dr. Bryan Lemma as patient lives in [facility] and would prefer to follow-up there. He will need repeat labs prior to that office visit. Patient will need to be scheduled for IV Feraheme x1 as outpatient next week Discharge home on 2 g sodium diet, discussed with patient and have asked nursing to print him out a copy Strict EtOH avoidance Continue  Lasix 20 mg p.o. every morning and Aldactone 50 mg p.o. every morning on discharge Continue nadolol on discharge Continue pantoprazole 40 mg p.o. every morning on discharge Have ordered hepatitis C PCR RNA quant-if positive he will need to be referred for treatment and this can be arranged as an outpatient.  I discussed the positive hep C antibody with the patient.          Principal Problem:   Symptomatic anemia Active Problems:   EtOH dependence (HCC)   Tobacco dependence   Alcoholic cirrhosis of liver with ascites (HCC)   Hyponatremia   Hypokalemia   Hypocalcemia   Iron deficiency anemia     LOS: 3 days   [name]  01/28/2019, 8:51 AM

## 2019-01-28 NOTE — Telephone Encounter (Signed)
Noted  

## 2019-01-28 NOTE — Telephone Encounter (Signed)
FYI

## 2019-01-28 NOTE — Telephone Encounter (Addendum)
Primary Information  Source Subject Topic  William Emery P "Tim" (Patient) William Emery P "Tim" (Patient) General - Call Back - No Documentation  Reason for CRM: Pt called to report information on pt William Whitaker (step-son) that pt requests be confidential. Modena Nunnery reports that [place] drinks a 12 pack a day and also smokes a pack a day. Modena Nunnery wants this information to be super confidential and not connected or discussed with [place] that he mentioned this. Modena Nunnery states that you can follow up with him about this   Best contact: [phone]

## 2019-01-28 NOTE — Discharge Summary (Signed)
Physician Discharge Summary  William Whitaker LPF:[id number] DOB: [birth year] DOA: 01/24/2019  PCP: Colon Branch, MD  Admit date: 01/24/2019 Discharge date: 01/28/2019  Admitted From: Home.  Disposition:  Home   Recommendations for Outpatient Follow-up:  1. Follow up with PCP in 1-2 weeks 2. Please obtain BMP/CBC in one week Please follow up with Gastroenterology as recommended.  Recommend outpatient folllow up with a CT chest in 6 to 12 months recommended.   Discharge Condition: stable.  CODE STATUS: full code.  Diet recommendation: Heart Healthy   Brief/Interim Summary: 58 year old male patient of Dr.Paz,who was seen in the office for increasing swelling in the leg ankle and abdomen along with increasing shortness of breath,going on for 2 to 3 weeks,with prior history of heavy alcohol use currently consuming 1-3 beers on a daily basis last few months,smoker,D, chronic bronchitis using inhaler, anxiety sent to the ER for evaluation while noted to have hemoglobin of 8.2 g,and also significant ascites and transferred to [facility] long for blood transfusion and acetic paracentesis. Patient reports no prior colonoscopy or endoscopy. He had:CT scan abd/ pelvis:Stigmata of cirrhosis, splenomegaly, and large volume ascites.ZHG:DJMEQAS bronchitic changes without infiltrate/Chronic RIGHT upper lobe nodule grossly stable since CT exam of Lab with hyponatremia, hypokalemia total bili 1.6 ammonia 15 EtOH less than 10 UDS negative FOBT negative,Wbc 4.0, hgb 6.2, Plt 202,Mcv 67.2, rdw 19.4 Urinalysis prot 30, wbc 6-10, rbc 0-5,BNP 92.8 Patientgiven lasix 20mg  iv x1, and benadryl 25mg  iv x1 in the ED COVID 19 rapid negative Underwent abdominal paracentesis with 10.5 L fluid removal, GI following and status post EGD colonoscopy 7/27.   Discharge Diagnoses:  Principal Problem:   Symptomatic anemia Active Problems:   EtOH dependence (HCC)   Tobacco dependence   Alcoholic cirrhosis of liver with  ascites (HCC)   Hyponatremia   Hypokalemia   Hypocalcemia   Iron deficiency anemia  Severe microcytic anemia, symptomatic:Work-up shows iron deficiency anemia, FOBT wasnegative.  EGD and colonoscopy  Shows has grade 1 and 2 esophageal varices without stigmata of bleeding, and will portal hypertensive gastropathy with contact oozing and gastritis-which is likely the cause of intermittent bleeding and iron deficiency anemia.  transtion to oral PPI and iron replacement.  Monitor hemoglobin and follow further serology work-up. So far 3 unit prbc transfused. H&H on discharge Is stable around 8.   Alcoholic cirrhosis with ascites:s/p 10.8 litre abdominal paracentesis w iv albumin. Ascites and leg edema is likely related to alcoholic cirrhosis  Cytology pending, no evidence of SBP. Work-up in progress with hepatitis serology, AFP, A1 AT, ANA, SMA, AMA, IgG, ceruloplasmin and TTG-as per GI. TSH-nl, cortisol-11,TTE shows normal EF no acute finding ruling out cardiac etiology.Continue Lasix and Aldactone.  EGD showed esophageal varices and portal gastropathy, added nadolol 20 mg daily.   Alcohol abuse patient reports he used to do heavy alcohol and for past few months has cut down to 2-3 beers daily. No signs of withdrawal at this time.   Tachycardia,?early etoh withdrawal,could be secondary to anemia.  Rate stable.  Monitor  Lung nodule CT chest- nodule of 1.0 cm. Consider follow-up CT of the chest without contrast in 6-12 months. RIGHT UPPER lobe solid nodule contains punctate calcifications centrally, favoring benign process now measuring 1.0 centimeters.  Hypocalcemia/hypomagnesemia and hypokalemia: Improved.    Discharge Instructions  Discharge Instructions    Diet - low sodium heart healthy   Complete by: As directed    Discharge instructions   Complete by: As directed    Please  follow up with gastroenterology as recommended.     Allergies as of 01/28/2019   No Known  Allergies     Medication List    STOP taking these medications   Aleve 220 MG Caps Generic drug: Naproxen Sodium   ibuprofen 200 MG tablet Commonly known as: ADVIL   LORazepam 1 MG tablet Commonly known as: Ativan   omeprazole 20 MG capsule Commonly known as: PRILOSEC   ondansetron 8 MG tablet Commonly known as: ZOFRAN     TAKE these medications   albuterol 108 (90 Base) MCG/ACT inhaler Commonly known as: VENTOLIN HFA Inhale 2 puffs into the lungs every 6 (six) hours as needed for wheezing (prn). What changed: Another medication with the same name was removed. Continue taking this medication, and follow the directions you see here.   aspirin EC 81 MG tablet Take 81 mg by mouth every other day.   Calcium-Magnesium-Vitamin D 600-40-500 MG-MG-UNIT Tb24 Take 1 tablet by mouth daily.   folic acid 1 MG tablet Commonly known as: FOLVITE Take 1 tablet (1 mg total) by mouth daily. Start taking on: January 29, 2019   furosemide 20 MG tablet Commonly known as: Lasix Take 1 tablet (20 mg total) by mouth daily.   hydroxypropyl methylcellulose / hypromellose 2.5 % ophthalmic solution Commonly known as: ISOPTO TEARS / GONIOVISC Place 1 drop into both eyes 3 (three) times daily as needed for dry eyes.   nadolol 20 MG tablet Commonly known as: CORGARD Take 1 tablet (20 mg total) by mouth daily. Start taking on: January 29, 2019   One-A-Day Mens VitaCraves Diona Fanti 1 tablet by mouth daily.   pantoprazole 40 MG tablet Commonly known as: Protonix Take 1 tablet (40 mg total) by mouth daily.   potassium chloride SA 20 MEQ tablet Commonly known as: K-DUR Take 1 tablet (20 mEq total) by mouth daily. Start taking on: January 29, 2019   spironolactone 50 MG tablet Commonly known as: ALDACTONE Take 1 tablet (50 mg total) by mouth daily. Start taking on: January 29, 2019   thiamine 100 MG tablet Take 1 tablet (100 mg total) by mouth daily. Start taking on: January 29, 2019   vitamin C  250 MG tablet Commonly known as: ASCORBIC ACID Take 250 mg by mouth daily.       No Known Allergies  Consultations:  gastroenterology   Procedures/Studies: Dg Chest 2 View  Result Date: 01/24/2019 CLINICAL DATA:  Shortness of breath EXAM: CHEST - 2 VIEW COMPARISON:  05/28/2016 Correlation: CT chest 05/28/2016, 01/31/2007 FINDINGS: Normal heart size, mediastinal contours, and pulmonary vascularity. Chronic nodular density RIGHT upper lobe 11 x 9 mm, previously 14 x 11 mm. This corresponds to a noncalcified 10 x 9 mm diameter nodule on the most recent prior CT, unchanged since 01/31/2007, indicative of benign behavior. Bronchitic changes without pulmonary infiltrate, pleural effusion or pneumothorax. No acute osseous findings. IMPRESSION: Chronic bronchitic changes without infiltrate. Chronic RIGHT upper lobe nodule grossly stable since CT exam of 01/31/2007. Electronically Signed   By: Lavonia Dana M.D.   On: 01/24/2019 17:00   Ct Chest Wo Contrast  Result Date: 01/25/2019 CLINICAL DATA:  Lung nodule.  Smoker. EXAM: CT CHEST WITHOUT CONTRAST TECHNIQUE: Multidetector CT imaging of the chest was performed following the standard protocol without IV contrast. COMPARISON:  CT of the chest abdomen and pelvis on 05/28/2016 FINDINGS: Cardiovascular: There is atherosclerotic calcification of the coronary arteries. The heart is mildly enlarged. No pericardial effusion. There is atherosclerosis of the  thoracic aorta, not associated with aneurysm. Noncontrast appearance of the pulmonary arteries is unremarkable. Mediastinum/Nodes: Normal appearance of the esophagus. The visualized portion of the thyroid gland has a normal appearance. No mediastinal, hilar, or axillary adenopathy. Lungs/Pleura: Within the LEFT UPPER lobe there is a focal 1.0 centimeter ground-glass nodule, possibly associated with central lucency (image 29/7). Within the RIGHT UPPER lobe there is a 1.0 centimeter nodule containing central  punctate calcification, previously measuring 0.8 centimeters.A 3 millimeter nodule is identified within the RIGHT UPPER lobe on image 53/7. There are scattered calcified granulomata. No focal consolidations or pleural effusions. No pulmonary edema. There is minimal scarring at the LEFT lung base. Upper Abdomen: There is a large volume of ascites in the UPPER abdomen. The liver contour is nodular and rounded, consistent with cirrhosis. Numerous varices are identified in the LEFT UPPER QUADRANT. Musculoskeletal: Remote RIGHT clavicle fracture. No suspicious lytic or blastic lesions. IMPRESSION: 1. Ground-glass nodule in the LEFT UPPER lobe, measuring 1.0 cm. Consider follow-up CT of the chest without contrast in 6-12 months. 2. RIGHT UPPER lobe solid nodule contains punctate calcifications centrally, favoring benign process now measuring 1.0 centimeters. 3. Smaller 3 millimeter nodule within the RIGHT UPPER lobe. 4. Evidence for prior granulomatous disease. 5. Coronary artery disease. 6. Remote RIGHT clavicle fracture. 7. Aortic Atherosclerosis (ICD10-I70.0). 8. Large volume ascites in the UPPER abdomen. 9. Cirrhosis. Electronically Signed   By: Nolon Nations M.D.   On: 01/25/2019 14:04   Ct Abdomen Pelvis W Contrast  Result Date: 01/24/2019 CLINICAL DATA:  Cirrhosis, abdominal swelling, ascites EXAM: CT ABDOMEN AND PELVIS WITH CONTRAST TECHNIQUE: Multidetector CT imaging of the abdomen and pelvis was performed using the standard protocol following bolus administration of intravenous contrast. CONTRAST:  143mL OMNIPAQUE IOHEXOL 300 MG/ML  SOLN COMPARISON:  None. FINDINGS: Lower chest: No acute abnormality. Hepatobiliary: No solid liver abnormality is seen. Coarse contour of the liver. No gallstones, gallbladder wall thickening, or biliary dilatation. Pancreas: Unremarkable. No pancreatic ductal dilatation or surrounding inflammatory changes. Spleen: Splenomegaly, maximum coronal span 17.3 cm Adrenals/Urinary  Tract: Adrenal glands are unremarkable. Kidneys are normal, without renal calculi, solid lesion, or hydronephrosis. Bladder is unremarkable. Stomach/Bowel: Stomach is within normal limits. Appendix is not clearly visualized. No evidence of bowel wall thickening, distention, or inflammatory changes. Vascular/Lymphatic: Aortic atherosclerosis. No enlarged abdominal or pelvic lymph nodes. Reproductive: No mass or other significant abnormality. Other: Fluid containing right inguinal hernia. Large volume ascites. Musculoskeletal: No acute or significant osseous findings. IMPRESSION: 1.  Stigmata of cirrhosis, splenomegaly, and large volume ascites. 2.  Aortic atherosclerosis. Electronically Signed   By: Eddie Candle M.D.   On: 01/24/2019 20:15   US Paracentesis  Result Date: 01/25/2019 INDICATION: Abdominal distention. Ascites. Request for diagnostic and therapeutic paracentesis. EXAM: ULTRASOUND GUIDED RIGHT LOWER QUADRANT PARACENTESIS MEDICATIONS: None. COMPLICATIONS: None immediate. PROCEDURE: Informed written consent was obtained from the patient after a discussion of the risks, benefits and alternatives to treatment. A timeout was performed prior to the initiation of the procedure. Initial ultrasound scanning demonstrates a large amount of ascites within the right lower abdominal quadrant. The right lower abdomen was prepped and draped in the usual sterile fashion. 1% lidocaine with epinephrine was used for local anesthesia. Following this, a 19 gauge, 7-cm, Yueh catheter was introduced. An ultrasound image was saved for documentation purposes. The paracentesis was performed. The catheter was removed and a dressing was applied. The patient tolerated the procedure well without immediate post procedural complication. FINDINGS: A total of approximately 10.8  L of clear yellow fluid was removed. Samples were sent to the laboratory as requested by the clinical team. IMPRESSION: Successful ultrasound-guided  paracentesis yielding 10.8 liters of peritoneal fluid. Read by: Ascencion Dike PA-C Electronically Signed   By: Sandi Mariscal M.D.   On: 01/25/2019 14:42       Subjective: No new complaints. Wants to go home   Discharge Exam: Vitals:   01/27/19 2115 01/28/19 0432  BP:  118/70  Pulse:  70  Resp:  18  Temp:  97.9 F (36.6 C)  SpO2: 97% 94%   Vitals:   01/27/19 1426 01/27/19 2004 01/27/19 2115 01/28/19 0432  BP: 105/64 118/70  118/70  Pulse: 64 70  70  Resp: 17 (!) 22  18  Temp: 98 F (36.7 C) 98.4 F (36.9 C)  97.9 F (36.6 C)  TempSrc: Oral Oral  Oral  SpO2: 96% 96% 97% 94%  Weight:    88.8 kg  Height:        General: Pt is alert, awake, not in acute distress Cardiovascular: RRR, S1/S2 +, no rubs, no gallops Respiratory: CTA bilaterally, no wheezing, no rhonchi Abdominal: Soft, NT, distended, bowel sounds good.  Extremities: no edema, no cyanosis    The results of significant diagnostics from this hospitalization (including imaging, microbiology, ancillary and laboratory) are listed below for reference.     Microbiology: Recent Results (from the past 240 hour(s))  SARS Coronavirus 2 (Performed in [place] hospital lab)     Status: None   Collection Time: 01/24/19  9:36 PM   Specimen: Nasopharyngeal Swab  Result Value Ref Range Status   SARS Coronavirus 2 NEGATIVE NEGATIVE Final    Comment: (NOTE) If result is NEGATIVE SARS-CoV-2 target nucleic acids are NOT DETECTED. The SARS-CoV-2 RNA is generally detectable in upper and lower  respiratory specimens during the acute phase of infection. The lowest  concentration of SARS-CoV-2 viral copies this assay can detect is 250  copies / mL. A negative result does not preclude SARS-CoV-2 infection  and should not be used as the sole basis for treatment or other  patient management decisions.  A negative result may occur with  improper specimen collection / handling, submission of specimen other  than nasopharyngeal  swab, presence of viral mutation(s) within the  areas targeted by this assay, and inadequate number of viral copies  (<250 copies / mL). A negative result must be combined with clinical  observations, patient history, and epidemiological information. If result is POSITIVE SARS-CoV-2 target nucleic acids are DETECTED. The SARS-CoV-2 RNA is generally detectable in upper and lower  respiratory specimens dur ing the acute phase of infection.  Positive  results are indicative of active infection with SARS-CoV-2.  Clinical  correlation with patient history and other diagnostic information is  necessary to determine patient infection status.  Positive results do  not rule out bacterial infection or co-infection with other viruses. If result is PRESUMPTIVE POSTIVE SARS-CoV-2 nucleic acids MAY BE PRESENT.   A presumptive positive result was obtained on the submitted specimen  and confirmed on repeat testing.  While 2019 novel coronavirus  (SARS-CoV-2) nucleic acids may be present in the submitted sample  additional confirmatory testing may be necessary for epidemiological  and / or clinical management purposes  to differentiate between  SARS-CoV-2 and other Sarbecovirus currently known to infect humans.  If clinically indicated additional testing with an alternate test  methodology [id number]) is advised. The SARS-CoV-2 RNA is generally  detectable in upper and  lower respiratory sp ecimens during the acute  phase of infection. The expected result is Negative. Fact Sheet for Patients:  StrictlyIdeas.no Fact Sheet for Healthcare Providers: BankingDealers.co.za This test is not yet approved or cleared by the [place] FDA and has been authorized for detection and/or diagnosis of SARS-CoV-2 by FDA under an Emergency Use Authorization (EUA).  This EUA will remain in effect (meaning this test can be used) for the duration of the COVID-19 declaration  under Section 564(b)(1) of the Act, 21 U.S.C. section 360bbb-3(b)(1), unless the authorization is terminated or revoked sooner. Performed at [facility], [address] [place], [place] [address]   Novel Coronavirus, NAA (hospital order; send-out to ref lab)     Status: None   Collection Time: 01/25/19  4:22 AM  Result Value Ref Range Status   SARS-CoV-2, NAA NOT DETECTED NOT DETECTED Final    Comment: (NOTE) This test was developed and its performance characteristics determined by [employer]. This test has not been FDA cleared or approved. This test has been authorized by FDA under an Emergency Use Authorization (EUA). This test is only authorized for the duration of time the declaration that circumstances exist justifying the authorization of the emergency use of in vitro diagnostic tests for detection of SARS-CoV-2 virus and/or diagnosis of COVID-19 infection under section 564(b)(1) of the Act, 21 U.S.C. 732KGU-5(K)(2), unless the authorization is terminated or revoked sooner. When diagnostic testing is negative, the possibility of a false negative result should be considered in the context of a patient's recent exposures and the presence of clinical signs and symptoms consistent with COVID-19. An individual without symptoms of COVID-19 and who is not shedding SARS-CoV-2 virus would expect to have a negative (not detected) result in this assay. Performed  At: [facility] [place], [place] [id number] Rush Farmer MD BT:[id number]    [place]  Final    Comment: Performed at [facility] [address], [place], [place] [address]  Culture, body fluid-bottle     Status: None (Preliminary result)   Collection Time: 01/25/19  2:03 PM   Specimen: Ascitic  Result Value Ref Range Status   Specimen Description ASCITIC  Final   Special Requests NONE  Final   Culture   Final    NO GROWTH 3  DAYS Performed at [facility] [address], [place], [place] [address]    Report Status PENDING  Incomplete  Gram stain     Status: None   Collection Time: 01/25/19  2:03 PM   Specimen: Ascitic  Result Value Ref Range Status   Specimen Description ASCITIC  Final   Special Requests NONE  Final   Gram Stain   Final    RARE WBC PRESENT, PREDOMINANTLY MONONUCLEAR NO ORGANISMS SEEN Performed at [facility], [place] [address], [place], [place] [address]    Report Status 01/26/2019 FINAL  Final     Labs: BNP (last 3 results) Recent Labs    01/24/19 1627  BNP 50.0   Basic Metabolic Panel: Recent Labs  Lab 01/24/19 1627 01/25/19 0523 01/25/19 0544 01/26/19 0513 01/27/19 0437 01/28/19 0420  NA 131*  --  133* 136 139 136  K 3.0*  --  3.2* 3.4* 3.5 4.0  CL 93*  --  95* 98 104 104  CO2 25  --  28 29 25 27   GLUCOSE 106*  --  99 98 95 94  BUN 7  --  7 7 6 11   CREATININE 0.62  --  0.62 0.54* 0.57* 0.62  CALCIUM 7.7*  --  7.8* 7.6* 7.8* 8.0*  MG  --  1.3*  --   --   --   --    Liver Function Tests: Recent Labs  Lab 01/24/19 1627 01/25/19 0544 01/26/19 0513 01/27/19 0437 01/28/19 0420  AST 37 38 25 27 34  ALT 11 12 9 11 11   ALKPHOS 90 89 66 67 71  BILITOT 1.6* 1.9* 2.4* 1.7* 1.4*  PROT 7.6 8.1 6.5 6.2* 6.4*  ALBUMIN 3.0* 3.2* 2.9* 2.7* 2.7*   Recent Labs  Lab 01/24/19 1627  LIPASE 35   Recent Labs  Lab 01/24/19 1657  AMMONIA 15   CBC: Recent Labs  Lab 01/24/19 1627 01/25/19 0544 01/26/19 0513 01/26/19 2017 01/27/19 0437 01/28/19 0420  WBC 4.0 4.5 3.9*  --  3.9* 5.0  NEUTROABS 2.5  --   --   --   --   --   HGB 6.2* 6.3* 7.2* 8.7* 7.7* 8.1*  HCT 24.0* 24.8*  23.7* 27.0* 31.8* 27.9* 30.4*  MCV 67.2* 68.3* 71.6*  --  73.6* 74.3*  PLT 202 214 176  --  183 225   Cardiac Enzymes: No results for input(s): CKTOTAL, CKMB, CKMBINDEX, TROPONINI in the last 168 hours. BNP: Invalid input(s): POCBNP CBG: No results for input(s): GLUCAP in the  last 168 hours. D-Dimer No results for input(s): DDIMER in the last 72 hours. Hgb A1c No results for input(s): HGBA1C in the last 72 hours. Lipid Profile No results for input(s): CHOL, HDL, LDLCALC, TRIG, CHOLHDL, LDLDIRECT in the last 72 hours. Thyroid function studies No results for input(s): TSH, T4TOTAL, T3FREE, THYROIDAB in the last 72 hours.  Invalid input(s): FREET3 Anemia work up Recent Labs    01/27/19 0437  RETICCTPCT 1.6   Urinalysis    Component Value Date/Time   COLORURINE BROWN (A) 01/24/2019 1627   APPEARANCEUR TURBID (A) 01/24/2019 1627   LABSPEC 1.025 01/24/2019 1627   PHURINE 6.0 01/24/2019 1627   GLUCOSEU 100 (A) 01/24/2019 1627   HGBUR NEGATIVE 01/24/2019 1627   BILIRUBINUR MODERATE (A) 01/24/2019 1627   KETONESUR 15 (A) 01/24/2019 1627   PROTEINUR 30 (A) 01/24/2019 1627   NITRITE NEGATIVE 01/24/2019 1627   LEUKOCYTESUR NEGATIVE 01/24/2019 1627   Sepsis Labs Invalid input(s): PROCALCITONIN,  WBC,  LACTICIDVEN Microbiology Recent Results (from the past 240 hour(s))  SARS Coronavirus 2 (Performed in [place] hospital lab)     Status: None   Collection Time: 01/24/19  9:36 PM   Specimen: Nasopharyngeal Swab  Result Value Ref Range Status   SARS Coronavirus 2 NEGATIVE NEGATIVE Final    Comment: (NOTE) If result is NEGATIVE SARS-CoV-2 target nucleic acids are NOT DETECTED. The SARS-CoV-2 RNA is generally detectable in upper and lower  respiratory specimens during the acute phase of infection. The lowest  concentration of SARS-CoV-2 viral copies this assay can detect is 250  copies / mL. A negative result does not preclude SARS-CoV-2 infection  and should not be used as the sole basis for treatment or other  patient management decisions.  A negative result may occur with  improper specimen collection / handling, submission of specimen other  than nasopharyngeal swab, presence of viral mutation(s) within the  areas targeted by this assay, and  inadequate number of viral copies  (<250 copies / mL). A negative result must be combined with clinical  observations, patient history, and epidemiological information. If result  is POSITIVE SARS-CoV-2 target nucleic acids are DETECTED. The SARS-CoV-2 RNA is generally detectable in upper and lower  respiratory specimens dur ing the acute phase of infection.  Positive  results are indicative of active infection with SARS-CoV-2.  Clinical  correlation with patient history and other diagnostic information is  necessary to determine patient infection status.  Positive results do  not rule out bacterial infection or co-infection with other viruses. If result is PRESUMPTIVE POSTIVE SARS-CoV-2 nucleic acids MAY BE PRESENT.   A presumptive positive result was obtained on the submitted specimen  and confirmed on repeat testing.  While 2019 novel coronavirus  (SARS-CoV-2) nucleic acids may be present in the submitted sample  additional confirmatory testing may be necessary for epidemiological  and / or clinical management purposes  to differentiate between  SARS-CoV-2 and other Sarbecovirus currently known to infect humans.  If clinically indicated additional testing with an alternate test  methodology [phone]) is advised. The SARS-CoV-2 RNA is generally  detectable in upper and lower respiratory sp ecimens during the acute  phase of infection. The expected result is Negative. Fact Sheet for Patients:  StrictlyIdeas.no Fact Sheet for Healthcare Providers: BankingDealers.co.za This test is not yet approved or cleared by the [place] FDA and has been authorized for detection and/or diagnosis of SARS-CoV-2 by FDA under an Emergency Use Authorization (EUA).  This EUA will remain in effect (meaning this test can be used) for the duration of the COVID-19 declaration under Section 564(b)(1) of the Act, 21 U.S.C. section 360bbb-3(b)(1), unless the  authorization is terminated or revoked sooner. Performed at [facility], [address] [place], [place] [address]   Novel Coronavirus, NAA (hospital order; send-out to ref lab)     Status: None   Collection Time: 01/25/19  4:22 AM  Result Value Ref Range Status   SARS-CoV-2, NAA NOT DETECTED NOT DETECTED Final    Comment: (NOTE) This test was developed and its performance characteristics determined by [employer]. This test has not been FDA cleared or approved. This test has been authorized by FDA under an Emergency Use Authorization (EUA). This test is only authorized for the duration of time the declaration that circumstances exist justifying the authorization of the emergency use of in vitro diagnostic tests for detection of SARS-CoV-2 virus and/or diagnosis of COVID-19 infection under section 564(b)(1) of the Act, 21 U.S.C. 235TDD-2(K)(0), unless the authorization is terminated or revoked sooner. When diagnostic testing is negative, the possibility of a false negative result should be considered in the context of a patient's recent exposures and the presence of clinical signs and symptoms consistent with COVID-19. An individual without symptoms of COVID-19 and who is not shedding SARS-CoV-2 virus would expect to have a negative (not detected) result in this assay. Performed  At: [facility] [place], [place] [id number] Rush Farmer MD JS:[id number]    [place]  Final    Comment: Performed at [facility] [address] [place], [place] [address]  Culture, body fluid-bottle     Status: None (Preliminary result)   Collection Time: 01/25/19  2:03 PM   Specimen: Ascitic  Result Value Ref Range Status   Specimen Description ASCITIC  Final   Special Requests NONE  Final   Culture   Final    NO GROWTH 3 DAYS Performed at [facility] [address] [place], [place] [address]     Report Status PENDING  Incomplete  Gram stain  Status: None   Collection Time: 01/25/19  2:03 PM   Specimen: Ascitic  Result Value Ref Range Status   Specimen Description ASCITIC  Final   Special Requests NONE  Final   Gram Stain   Final    RARE WBC PRESENT, PREDOMINANTLY MONONUCLEAR NO ORGANISMS SEEN Performed at [facility], [address], [place], [place] [address]    Report Status 01/26/2019 FINAL  Final     Time coordinating discharge: 34 minutes  SIGNED:   Hosie Poisson, MD  [facility] 01/28/2019, 11:49 AM Pager   If 7PM-7AM, please contact night-coverage [email or web address] Password [email or web address]

## 2019-01-28 NOTE — Progress Notes (Signed)
PT Cancellation Note  Patient Details Name: AADON GORELIK MRN: [id number] DOB: [birth year]   Cancelled Treatment:    Reason Eval/Treat Not Completed: PT screened, no needs identified, will sign off. Spoke with pt who denied need for PT services. Will sign off at pt's request.   Weston Anna, PT Acute Rehabilitation Services Pager: [phone] Office: [phone]

## 2019-01-29 ENCOUNTER — Telehealth: Payer: Self-pay | Admitting: Internal Medicine

## 2019-01-29 LAB — HCV RNA QUANT
HCV Quantitative Log: 6.334 log10 IU/mL (ref >1.70)
HCV Quantitative: 2160000 IU/mL (ref >50)

## 2019-01-29 MED ORDER — TOPIRAMATE 25 MG PO TABS: 25.0000 mg | ORAL_TABLET | Freq: Every day | ORAL | 1 refills | Status: DC

## 2019-01-29 NOTE — Telephone Encounter (Signed)
Spoke w/ Pt- informed of recommendations. He is willing to try Topamax- Rx sent. Informed that he should receive a call from [facility] to schedule hospital f/u appt in the next several days. Pt verbalized understanding.

## 2019-01-29 NOTE — Telephone Encounter (Signed)
Advise patient: 1.  I strongly recommend him to start attending AA meetings ASAP 2.  There are few medications available, the one I am familiar with is Topamax; if patient willing to start, send a prescription for Topamax 25 mg 1 p.o. daily #30 and 1 refill. 3.  Advise to see psychiatry, they have more experience with the other drugs to decrease desire to drink.

## 2019-01-29 NOTE — Telephone Encounter (Signed)
Patient just got out of [facility] for Cirrhosis of the liver and he would like to know if the provider can give him something to knock out the desire to drink a beer.

## 2019-01-29 NOTE — Telephone Encounter (Signed)
Please advise 

## 2019-01-30 ENCOUNTER — Other Ambulatory Visit: Payer: Self-pay

## 2019-01-30 ENCOUNTER — Telehealth: Payer: Self-pay

## 2019-01-30 ENCOUNTER — Telehealth: Payer: Self-pay | Admitting: *Deleted

## 2019-01-30 DIAGNOSIS — D509 Iron deficiency anemia, unspecified: Secondary | ICD-10-CM

## 2019-01-30 LAB — CULTURE, BODY FLUID W GRAM STAIN -BOTTLE: Culture: NO GROWTH

## 2019-01-30 MED ORDER — HYPROMELLOSE (GONIOSCOPIC) 2.5 % OP SOLN: 1.0000 [drp] | Freq: Three times a day (TID) | OPHTHALMIC | 3 refills | Status: DC | PRN

## 2019-01-30 MED ORDER — ALBUTEROL SULFATE HFA 108 (90 BASE) MCG/ACT IN AERS: 2.0000 | INHALATION_SPRAY | Freq: Four times a day (QID) | RESPIRATORY_TRACT | 5 refills | Status: DC | PRN

## 2019-01-30 NOTE — Telephone Encounter (Signed)
Called and spoke with patient-patient was informed of MD recommendations and is agreeable to plan of care; patient has been scheduled with Dr. Bryan Lemma on 02/19/2019 at 4:00 pm; patient is requesting to have lab work completed at his PCP office (at [facility] request labs to be done at PCP for patient; patient verbalized understanding of information/instructions; patient was advised to call back to the office should questions/concerns arise at [phone];    Dr. Larose Kells, Would you be willing to have your office draw the requested lab work CMET and CBC prior to this patient's office visit with Dr. Bryan Lemma on 02/19/2019 for him to review-as he is also your patient and the patient has requested to have lab work done at your office for convenience? Please advise

## 2019-01-30 NOTE — Telephone Encounter (Signed)
Okay to refill Isopto Tears #1 and 3 RF Okay refill albuterol

## 2019-01-30 NOTE — Telephone Encounter (Signed)
Copied from [place] [id number]. Topic: General - Inquiry >> Jan 30, 2019 12:36 PM William Whitaker, NT wrote: Reason for CRM: Patient called in stating he was advised from [facility] d/c to request from PCP albuterol (PROVENTIL HFA;VENTOLIN HFA) 108 (90 BASE) MCG/ACT inhaler be filled. As well as eyedrops. Please advise.

## 2019-01-30 NOTE — Telephone Encounter (Signed)
-----   Message from Alfredia Ferguson, PA-C sent at 01/29/2019  1:01 PM EDT ----- Regarding: RE: new pt - post hospital Yes he did .. please call pt when you have appt time etc- thanks! ----- Message ----- From: Algernon Huxley, RN Sent: 01/29/2019   9:55 AM EDT To: Alfredia Ferguson, PA-C Subject: RE: new pt - post hospital                     Amy did Dr. Loletha Grayer agree to see this pt? ----- Message ----- From: Alfredia Ferguson, PA-C Sent: 01/28/2019  10:17 AM EDT To: Algernon Huxley, RN, Jerene Bears, MD, # Subject: new pt - post hospital                         . 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 Will need office visit in 10-14 days , labs  And needs set up for fereheme X one next week - Vaughan Basta if you could set that up , and get him an appt with Dr C  with CMET, CBC before visit..  Thank you !

## 2019-01-30 NOTE — Telephone Encounter (Signed)
Please advise 

## 2019-01-30 NOTE — Telephone Encounter (Signed)
Yes he is scheduled to see you on 02/04/2019 and he is scheduled to have his 2nd IV Ferriheme on 02/06/2019. Do you want to wait until after the IV Ferriheme to complete the repeat lab work or does a second set of repeat labs need to be completed after the infusion?

## 2019-01-30 NOTE — Telephone Encounter (Signed)
Transition Care Management Follow-up Telephone Call   Date discharged? 01/28/19   How have you been since you were released from the hospital?  " I'm doing better"   Do you understand why you were in the hospital? "yes, I have fluid on my abdomen and diagnosed me with cirrhosis."   Do you understand the discharge instructions? yes   Where were you discharged to? Home. Sister is with him.   Items Reviewed:  Medications reviewed: "I have 5 or 6 new prescriptions. I will bring when I come see Dr.Paz"  Allergies reviewed: No new allergies per pt  Dietary changes reviewed: "low sodium and no alcohol"  Referrals reviewed: yes   Functional Questionnaire:   Activities of Daily Living (ADLs):   He states they are independent in the following: ambulation, bathing and hygiene, feeding, continence, grooming, toileting and dressing States they require assistance with the following: n/a   Any transportation issues/concerns?: no   Any patient concerns? no   Confirmed importance and date/time of follow-up visits scheduled yes  Provider Appointment booked with Dr.Paz 02/04/19 @1120   Confirmed with patient if condition begins to worsen call PCP or go to the ER.  Patient was given the office number and encouraged to call back with question or concerns.  : yes

## 2019-01-30 NOTE — Telephone Encounter (Signed)
LVM for pt to call office

## 2019-01-30 NOTE — Telephone Encounter (Signed)
Yes, he has an appointment with me 02/04/2019, CMP and CBC will be obtained at that time.

## 2019-01-30 NOTE — Telephone Encounter (Signed)
Agree, will discuss when he comes back, his options are  limited

## 2019-01-30 NOTE — Telephone Encounter (Signed)
Rx's sent. Pt also requesting non-narcotic to help w/ back pain and abdominal pain. Informed he may have to wait until hosp visit on 02/04/2019.

## 2019-01-30 NOTE — Telephone Encounter (Signed)
Pt scheduled for second feraheme infusion at the pt care center 02/05/19@9am . Pt aware of appt, orders in epic. Pt need OV with Dr. Tracie Harrier, there are no open slots within the 10-14 day period. Message forwarded to Dr. Orland Penman nurse to set up labs and OV.

## 2019-01-31 ENCOUNTER — Telehealth: Payer: Self-pay

## 2019-01-31 ENCOUNTER — Ambulatory Visit: Payer: Self-pay | Admitting: Internal Medicine

## 2019-01-31 NOTE — Telephone Encounter (Signed)
Non-alcohol beer is okay

## 2019-01-31 NOTE — Telephone Encounter (Signed)
Copied from [place] [id number]. Topic: General - Other >> Jan 31, 2019  2:58 PM Burchel, Abbi R wrote: Reason for CRM: Pt states he has not consumed any alcohol since being advised by Dr Larose Kells to stop drinking.  Pt would like to know if it is okay for him to drink non-alcoholic beer because he enjoys the taste.  Please let pt know if this is okay.   Pt [phone]

## 2019-01-31 NOTE — Telephone Encounter (Signed)
I am planning to see him 02/04/2019, if he requires further labs simply let me know.

## 2019-01-31 NOTE — Telephone Encounter (Signed)
Spoke w/ Pt- informed that he can have non-alcoholic beer. Also, informed he can use OTC Voltaren gel for aches and pains over weekend until he see's Dr. Larose Kells on Tuesday. Pt verbalized understanding.

## 2019-01-31 NOTE — Telephone Encounter (Signed)
Dr. Kathrin Ruddy there any additional labs that Dr. Larose Kells will need to obtain prior to the OV with you? See previous messages Please advise

## 2019-02-04 ENCOUNTER — Encounter: Payer: Self-pay | Admitting: Internal Medicine

## 2019-02-04 ENCOUNTER — Other Ambulatory Visit: Payer: Self-pay

## 2019-02-04 ENCOUNTER — Ambulatory Visit (INDEPENDENT_AMBULATORY_CARE_PROVIDER_SITE_OTHER): Payer: PPO | Admitting: Internal Medicine

## 2019-02-04 VITALS — BP 122/67 | HR 74 | Temp 98.6°F | Resp 18 | Ht 72.0 in | Wt 188.4 lb

## 2019-02-04 DIAGNOSIS — F17200 Nicotine dependence, unspecified, uncomplicated: Secondary | ICD-10-CM

## 2019-02-04 DIAGNOSIS — K7031 Alcoholic cirrhosis of liver with ascites: Secondary | ICD-10-CM | POA: Diagnosis not present

## 2019-02-04 DIAGNOSIS — F1029 Alcohol dependence with unspecified alcohol-induced disorder: Secondary | ICD-10-CM

## 2019-02-04 DIAGNOSIS — D509 Iron deficiency anemia, unspecified: Secondary | ICD-10-CM

## 2019-02-04 DIAGNOSIS — K739 Chronic hepatitis, unspecified: Secondary | ICD-10-CM

## 2019-02-04 MED ORDER — POTASSIUM CHLORIDE CRYS ER 20 MEQ PO TBCR: 20.0000 meq | EXTENDED_RELEASE_TABLET | Freq: Every day | ORAL | 1 refills | Status: DC

## 2019-02-04 MED ORDER — DICLOFENAC SODIUM 1 % TD GEL: 2.0000 g | Freq: Four times a day (QID) | TRANSDERMAL | 2 refills | Status: DC

## 2019-02-04 MED ORDER — THIAMINE HCL 100 MG PO TABS: 100.0000 mg | ORAL_TABLET | Freq: Every day | ORAL | 1 refills | Status: DC

## 2019-02-04 MED ORDER — TOPIRAMATE 25 MG PO TABS: 25.0000 mg | ORAL_TABLET | Freq: Every day | ORAL | 1 refills | Status: DC

## 2019-02-04 MED ORDER — PANTOPRAZOLE SODIUM 40 MG PO TBEC: 40.0000 mg | DELAYED_RELEASE_TABLET | Freq: Every day | ORAL | 1 refills | Status: DC

## 2019-02-04 MED ORDER — FOLIC ACID 1 MG PO TABS: 1.0000 mg | ORAL_TABLET | Freq: Every day | ORAL | 1 refills | Status: DC

## 2019-02-04 MED ORDER — NADOLOL 20 MG PO TABS: 20.0000 mg | ORAL_TABLET | Freq: Every day | ORAL | 1 refills | Status: DC

## 2019-02-04 MED ORDER — SPIRONOLACTONE 50 MG PO TABS: 50.0000 mg | ORAL_TABLET | Freq: Every day | ORAL | 1 refills | Status: DC

## 2019-02-04 NOTE — Progress Notes (Signed)
Pre visit review using our clinic review tool, if applicable. No additional management support is needed unless otherwise documented below in the visit note. 

## 2019-02-04 NOTE — Patient Instructions (Signed)
GO TO THE LAB : Get the blood work     GO TO THE FRONT DESK Schedule your next appointment for a checkup in 2 months from today  Take the medications as prescribed, see your list  Systane eyedrops over-the-counter  For pain, try Voltaren gel 4 times a day as needed  Strongly consider   start AA meetings

## 2019-02-04 NOTE — Progress Notes (Signed)
Subjective:    Patient ID: William Whitaker, male    DOB: 12-Sep-1960, 58 y.o.   MRN: [id number]  DOS:  02/04/2019 Type of visit - description: TCM 7 Admitted to hospital and discharge 01/28/2019. He was admitted with lower extremity edema and abdominal swelling.  Had multiple issues:  Severe anemia: Work-up showed iron deficiency, FOBT negative.  Had a EGD and colonoscopy. EtOH abuse. Cirrhosis: Had a large-volume paracentesis with IV albumin. Electrolyte abnormalities: Improve at the time of discharge.   Review of Systems  Since he left the hospital, he has not drink alcohol Good compliance with medications except thiamine. No fever chills Appetite is coming back No nausea, vomiting.  Did have some mild diarrhea.  No blood in the stools Continue with bilateral low back pain without radiation.  Treatment?.  Past Medical History:  Diagnosis Date  . Anxiety   . Bronchitis   . GERD (gastroesophageal reflux disease)   . Insomnia     Past Surgical History:  Procedure Laterality Date  . BIOPSY  01/27/2019   Procedure: BIOPSY;  Surgeon: Irving Copas., MD;  Location: [facility];  Service: Gastroenterology;;  . COLONOSCOPY WITH PROPOFOL N/A 01/27/2019   Procedure: COLONOSCOPY WITH PROPOFOL;  Surgeon: Irving Copas., MD;  Location: [facility];  Service: Gastroenterology;  Laterality: N/A;  . ESOPHAGOGASTRODUODENOSCOPY (EGD) WITH PROPOFOL N/A 01/27/2019   Procedure: ESOPHAGOGASTRODUODENOSCOPY (EGD) WITH PROPOFOL;  Surgeon: Rush Landmark Telford Nab., MD;  Location: [facility];  Service: Gastroenterology;  Laterality: N/A;  . HERNIA REPAIR Right remotely as a baby  . POLYPECTOMY  01/27/2019   Procedure: POLYPECTOMY;  Surgeon: Mansouraty, Telford Nab., MD;  Location: [facility];  Service: Gastroenterology;;    Social History   Socioeconomic History  . Marital status: Married    Spouse name: Not on file  . Number of children: Not on file  . Years of education: Not  on file  . Highest education level: Not on file  Occupational History  . Occupation: disability d/t COPD  Social Needs  . Financial resource strain: Not on file  . Food insecurity    Worry: Not on file    Inability: Not on file  . Transportation needs    Medical: Not on file    Non-medical: Not on file  Tobacco Use  . Smoking status: Current Every Day Smoker    Packs/day: 0.50    Years: 25.00    Pack years: 12.50    Types: Cigarettes  . Smokeless tobacco: Never Used  . Tobacco comment: 3 PPD, started to devrease to 1/2 ppd 2019  Substance and Sexual Activity  . Alcohol use: Yes    Alcohol/week: 60.0 standard drinks    Types: 60 Cans of beer per week    Comment: 3-4 beers a day  . Drug use: No  . Sexual activity: Not on file  Lifestyle  . Physical activity    Days per week: Not on file    Minutes per session: Not on file  . Stress: Not on file  Relationships  . Social Herbalist on phone: Not on file    Gets together: Not on file    Attends religious service: Not on file    Active member of club or organization: Not on file    Attends meetings of clubs or organizations: Not on file    Relationship status: Not on file  . Intimate partner violence    Fear of current or ex partner: Not on  file    Emotionally abused: Not on file    Physically abused: Not on file    Forced sexual activity: Not on file  Other Topics Concern  . Not on file  Social History Narrative  . Not on file      Allergies as of 02/04/2019   No Known Allergies     Medication List       Accurate as of February 04, 2019 11:59 PM. If you have any questions, ask your nurse or doctor.        STOP taking these medications   hydroxypropyl methylcellulose / hypromellose 2.5 % ophthalmic solution Commonly known as: ISOPTO TEARS / [place] Stopped by: Kathlene November, MD     TAKE these medications   albuterol 108 (90 Base) MCG/ACT inhaler Commonly known as: VENTOLIN HFA Inhale 2 puffs into  the lungs every 6 (six) hours as needed for wheezing or shortness of breath.   aspirin EC 81 MG tablet Take 81 mg by mouth every other day.   Calcium-Magnesium-Vitamin D 600-40-500 MG-MG-UNIT Tb24 Take 1 tablet by mouth daily.   diclofenac sodium 1 % Gel Commonly known as: Voltaren Apply 2 g topically 4 (four) times daily. Started by: Kathlene November, MD   folic acid 1 MG tablet Commonly known as: FOLVITE Take 1 tablet (1 mg total) by mouth daily.   furosemide 20 MG tablet Commonly known as: Lasix Take 1 tablet (20 mg total) by mouth daily.   nadolol 20 MG tablet Commonly known as: CORGARD Take 1 tablet (20 mg total) by mouth daily.   One-A-Day Mens VitaCraves Chew Chew 1 tablet by mouth daily.   pantoprazole 40 MG tablet Commonly known as: Protonix Take 1 tablet (40 mg total) by mouth daily.   potassium chloride SA 20 MEQ tablet Commonly known as: K-DUR Take 1 tablet (20 mEq total) by mouth daily.   spironolactone 50 MG tablet Commonly known as: ALDACTONE Take 1 tablet (50 mg total) by mouth daily.   thiamine 100 MG tablet Take 1 tablet (100 mg total) by mouth daily.   topiramate 25 MG tablet Commonly known as: Topamax Take 1 tablet (25 mg total) by mouth daily.   vitamin C 250 MG tablet Commonly known as: ASCORBIC ACID Take 250 mg by mouth daily.           Objective:   Physical Exam BP 122/67 (BP Location: Left Arm, Patient Position: Sitting, Cuff Size: Small)   Pulse 74   Temp 98.6 F (37 C) (Oral)   Resp 18   Ht 6' (1.829 m)   Wt 188 lb 6 oz (85.4 kg)   SpO2 98%   BMI 25.55 kg/m  General:   Well developed, NAD, BMI noted.  HEENT:  Normocephalic . Face symmetric, atraumatic.  He still looks somewhat pale but not jaundice Lungs:  Decreased breath sounds, no respiratory distress. Heart: RRR,  no murmur.  Trace  pretibial edema bilaterally  Abdomen:  Distended, less so compared to the last time I saw him.  + Umbilical hernia. Skin: Not pale. Not  jaundice Neurologic:  alert & oriented X3.  Speech normal, gait appropriate for age and unassisted Psych--  Cognition and judgment appear intact.  Cooperative with normal attention span and concentration.  Behavior appropriate. No anxious or depressed appearing.     Assessment     ASSESSMENT (referred by [name])  COPD Anxiety GERD H/o ETOH and Heavy tobacco use (3ppd)  Plan: Cirrhosis of the liver: Status  post admission to the hospital with edema and abdominal distention, DX cirrhosis.  He was evaluated by GI.  Had EGD and colonoscopy.  It showed grade 1 and 2 esophageal varices.  + Portal hypertensive gastropathy.  Echocardiogram showed normal EF ruling out cardiac etiology of ascites. Extensive blood work done. Hep C was positive.  He has a follow-up with GI.  Currently on nadolol, Lasix, Aldactone, K+. Plan: Extensive information about cirrhosis printed for the patient, will check CMP , Mg and CBC Hep C: Per GI EtOH: Strongly recommend thiamine, abstinence, call AA for meetings, he asked for a medication to help with cravings and I recommended Topamax.  Has not been able to get it just yet. Anemia: Iron deficiency, status post 3 PRBCs, saw GI, had a colonoscopy and EGD. Anemia felt to be due to slow blood loss from gastropathy due to cirrhosis.  Currently on iron. Colon polyps: Follow-up by GI Back pain: Unable to treat with NSAIDs or Tylenol.  D/t h/o etoh I cannot prescribe narcotics.  Offered a back specialist referral, declined for now, recommend topical Voltaren RTC 2 months

## 2019-02-05 ENCOUNTER — Encounter (HOSPITAL_COMMUNITY): Payer: PPO

## 2019-02-05 ENCOUNTER — Other Ambulatory Visit (INDEPENDENT_AMBULATORY_CARE_PROVIDER_SITE_OTHER): Payer: PPO

## 2019-02-05 DIAGNOSIS — K7031 Alcoholic cirrhosis of liver with ascites: Secondary | ICD-10-CM | POA: Diagnosis not present

## 2019-02-05 DIAGNOSIS — K739 Chronic hepatitis, unspecified: Secondary | ICD-10-CM | POA: Insufficient documentation

## 2019-02-05 DIAGNOSIS — Z09 Encounter for follow-up examination after completed treatment for conditions other than malignant neoplasm: Secondary | ICD-10-CM | POA: Insufficient documentation

## 2019-02-05 NOTE — Telephone Encounter (Signed)
No, I think those labs will work well for his scheduled appt with me. Thanks.

## 2019-02-05 NOTE — Addendum Note (Signed)
Addended by: Kelle Darting A on: 02/05/2019 02:30 PM   Modules accepted: Orders

## 2019-02-05 NOTE — Assessment & Plan Note (Signed)
Cirrhosis of the liver: Status post admission to the hospital with edema and abdominal distention, DX cirrhosis.  He was evaluated by GI.  Had EGD and colonoscopy.  It showed grade 1 and 2 esophageal varices.  + Portal hypertensive gastropathy.  Echocardiogram showed normal EF ruling out cardiac etiology of ascites. Extensive blood work done. Hep C was positive.  He has a follow-up with GI.  Currently on nadolol, Lasix, Aldactone, K+. Plan: Extensive information about cirrhosis printed for the patient, will check CMP , Mg and CBC Hep C: Per GI EtOH: Strongly recommend thiamine, abstinence, call AA for meetings, he asked for a medication to help with cravings and I recommended Topamax.  Has not been able to get it just yet. Anemia: Iron deficiency, status post 3 PRBCs, saw GI, had a colonoscopy and EGD. Anemia felt to be due to slow blood loss from gastropathy due to cirrhosis.  Currently on iron. Colon polyps: Follow-up by GI Back pain: Unable to treat with NSAIDs or Tylenol.  D/t h/o etoh I cannot prescribe narcotics.  Offered a back specialist referral, declined for now, recommend topical Voltaren RTC 2 months

## 2019-02-06 ENCOUNTER — Ambulatory Visit (HOSPITAL_COMMUNITY)
Admission: RE | Admit: 2019-02-06 | Discharge: 2019-02-06 | Disposition: A | Discharge status: Home / Self Care | Payer: PPO | Source: Ambulatory Visit | Attending: Gastroenterology | Admitting: Gastroenterology

## 2019-02-06 ENCOUNTER — Other Ambulatory Visit: Payer: Self-pay

## 2019-02-06 DIAGNOSIS — D509 Iron deficiency anemia, unspecified: Secondary | ICD-10-CM | POA: Diagnosis not present

## 2019-02-06 LAB — CBC WITH DIFFERENTIAL/PLATELET
Basophils Absolute: 0.2 10*3/uL — ABNORMAL HIGH (ref 0.0–0.1)
Basophils Relative: 2.6 % (ref 0.0–3.0)
Eosinophils Absolute: 0.1 10*3/uL (ref 0.0–0.7)
Eosinophils Relative: 1.4 % (ref 0.0–5.0)
HCT: 32.1 % — ABNORMAL LOW (ref 39.0–52.0)
Hemoglobin: 9.7 g/dL — ABNORMAL LOW (ref 13.0–17.0)
Lymphocytes Relative: 18.6 % (ref 12.0–46.0)
Lymphs Abs: 1.1 10*3/uL (ref 0.7–4.0)
MCHC: 30 g/dL (ref 30.0–36.0)
MCV: 75.3 fl — ABNORMAL LOW (ref 78.0–100.0)
Monocytes Absolute: 0.9 10*3/uL (ref 0.1–1.0)
Monocytes Relative: 14.8 % — ABNORMAL HIGH (ref 3.0–12.0)
Neutro Abs: 3.8 10*3/uL (ref 1.4–7.7)
Neutrophils Relative %: 62.6 % (ref 43.0–77.0)
Platelets: 233 10*3/uL (ref 150.0–400.0)
RBC: 4.27 Mil/uL (ref 4.22–5.81)
RDW: 30.8 % — ABNORMAL HIGH (ref 11.5–15.5)
WBC: 6.1 10*3/uL (ref 4.0–10.5)

## 2019-02-06 LAB — COMPREHENSIVE METABOLIC PANEL
ALT: 11 U/L (ref 0–53)
AST: 43 U/L — ABNORMAL HIGH (ref 0–37)
Albumin: 3.3 g/dL — ABNORMAL LOW (ref 3.5–5.2)
Alkaline Phosphatase: 98 U/L (ref 39–117)
BUN: 6 mg/dL (ref 6–23)
CO2: 28 mEq/L (ref 19–32)
Calcium: 8.4 mg/dL (ref 8.4–10.5)
Chloride: 99 mEq/L (ref 96–112)
Creatinine, Ser: 0.53 mg/dL (ref 0.40–1.50)
GFR: 158.96 mL/min (ref >60.00)
Glucose, Bld: 99 mg/dL (ref 70–99)
Potassium: 3.8 mEq/L (ref 3.5–5.1)
Sodium: 134 mEq/L — ABNORMAL LOW (ref 135–145)
Total Bilirubin: 1.1 mg/dL (ref 0.2–1.2)
Total Protein: 7.3 g/dL (ref 6.0–8.3)

## 2019-02-06 LAB — MAGNESIUM: Magnesium: 1.4 mg/dL — ABNORMAL LOW (ref 1.5–2.5)

## 2019-02-06 MED ORDER — SODIUM CHLORIDE 0.9 % IV SOLN
510.0000 mg | Freq: Once | INTRAVENOUS | Status: AC
  Administered 2019-02-06: 510 mg via INTRAVENOUS
  Filled 2019-02-06: qty 17

## 2019-02-06 MED ORDER — SODIUM CHLORIDE 0.9 % IV SOLN
INTRAVENOUS | Status: DC | PRN
  Administered 2019-02-06: 250 mL via INTRAVENOUS

## 2019-02-06 NOTE — Discharge Instructions (Signed)

## 2019-02-12 ENCOUNTER — Telehealth: Payer: Self-pay | Admitting: Internal Medicine

## 2019-02-12 DIAGNOSIS — R05 Cough: Secondary | ICD-10-CM

## 2019-02-12 DIAGNOSIS — R059 Cough, unspecified: Secondary | ICD-10-CM

## 2019-02-12 NOTE — Telephone Encounter (Signed)
Pt would like to know if Dr. Larose Kells thinks it is safe for him to get a flu shot now? Also he would like to see if Dr. Larose Kells would send in a rx for cough syrup for him to take at night as he has a hard time going to sleep due to his cough. Please advise.CB#[phone]  [id number] - [place], [place] - [address] AT [phone] (Phone) [phone] (Fax)

## 2019-02-12 NOTE — Telephone Encounter (Signed)
Please advise 

## 2019-02-12 NOTE — Telephone Encounter (Signed)
Yes, she should get a flu shot. For nighttime cough, recommend to try OTC Mucinex DM. If the cough continue, use Tessalon. Send a prescription:  Tessalon Perles 100 mg 1 p.o. nightly #30 and 1 refill to use as needed

## 2019-02-13 NOTE — Telephone Encounter (Signed)
Pt calling to check status. Please advise  °

## 2019-02-14 MED ORDER — BENZONATATE 100 MG PO CAPS: 100.0000 mg | ORAL_CAPSULE | Freq: Two times a day (BID) | ORAL | 1 refills | Status: DC | PRN

## 2019-02-14 NOTE — Telephone Encounter (Signed)
Patient notified and prescription sent in.

## 2019-02-18 ENCOUNTER — Telehealth: Payer: Self-pay

## 2019-02-18 NOTE — Telephone Encounter (Signed)
Covid-19 screening questions   Do you now or have you had a fever in the last 14 days? No  Do you have any respiratory symptoms of shortness of breath or cough now or in the last 14 days? Have COPD  Do you have any family members or close contacts with diagnosed or suspected Covid-19 in the past 14 days? No  Have you been tested for Covid-19 and found to be positive? Tested 2 times was negative

## 2019-02-19 ENCOUNTER — Other Ambulatory Visit: Payer: Self-pay

## 2019-02-19 ENCOUNTER — Ambulatory Visit (INDEPENDENT_AMBULATORY_CARE_PROVIDER_SITE_OTHER): Payer: PPO | Admitting: Gastroenterology

## 2019-02-19 ENCOUNTER — Encounter: Payer: Self-pay | Admitting: Gastroenterology

## 2019-02-19 VITALS — BP 112/68 | HR 74 | Temp 98.2°F | Ht 72.0 in | Wt 171.1 lb

## 2019-02-19 DIAGNOSIS — D509 Iron deficiency anemia, unspecified: Secondary | ICD-10-CM | POA: Diagnosis not present

## 2019-02-19 DIAGNOSIS — R188 Other ascites: Secondary | ICD-10-CM

## 2019-02-19 DIAGNOSIS — F17200 Nicotine dependence, unspecified, uncomplicated: Secondary | ICD-10-CM | POA: Diagnosis not present

## 2019-02-19 DIAGNOSIS — K7460 Unspecified cirrhosis of liver: Secondary | ICD-10-CM | POA: Diagnosis not present

## 2019-02-19 DIAGNOSIS — Z860100 Personal history of colon polyps, unspecified: Secondary | ICD-10-CM

## 2019-02-19 DIAGNOSIS — B1920 Unspecified viral hepatitis C without hepatic coma: Secondary | ICD-10-CM | POA: Diagnosis not present

## 2019-02-19 NOTE — Progress Notes (Signed)
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  Treated with large volume paracentesis (SAAG >1.1, -SBP, cytology negative) and started on Lasix/Aldactone.  EGD with EV 1 and 2, PHG.  Given PRBC x2, Feraheme x2 for severe IDA (presumed d/t PHG with oozing).  Started on nadolol for primary prophylaxis and Protonix.  Cirrhosis Evaluation: - Etiology: Hepatitis C, alcohol - Complications: Ascites, esophageal varices, coagulapthy  - HCC screening: AFP- 01/2019; CT w/o [place] 01/2019 - Variceal screening: EGD 01/2019- grade 1 and grade 2 varices - Serologic evaluation: ASMA mildly elevated at 20 (ULN 19). Otherwise negative - Viral hepatitis vaccination: Needs HAV lab check and HBV vaccine - Flu vaccine: Due - Liver biopsy: None  - Medications: Lasix 20 mg, Aldactone 50 mg, nadolol 20 mg - MELD: 10 - Child Pugh score: B (8 points)  -HCV antibody positive -HCV viral load: 2.1 million copies  Endoscopic history: - EGD (01/2019, Dr. Rush Landmark): Grade 1 and grade 2 varices, diffuse portal hypertensive gastropathy with oozing -Colonoscopy (01/2019, Dr. Rush Landmark): 4 small tubular adenomas, internal hemorrhoids.  Repeat in 3 years   HPI:    Patient is a 58 y.o. male presenting [facility] for hospital follow-up.  Today, he states he feels much improved since hospital discharge.  Continues take all medications as prescribed.  Tolerating all p.o. intake without issue.  As for hepatic/HCV risk factors: Intranasal cocaine 20+ years ago. No IVDU. Hx of heavy EtOH use previously, drinking 6-8 beers/day for many years. No EtOH since leaving the hospital.  Now drinks Bud zero (zero alcohol) since leaving the hospital. Taking all meds as prescribed. Low Na diet. No  withdrawal sxs.   Repeat labs on 8/5: H/H 9.7/32 (from 8.1/30 on hospital discharge), platelets 233, T bili 1.1, creatinine 0.5, magnesium 1.4 (started on mag supplement).  HCV RNA quant 2.1 million.  Still smoking 1/2 PPD from 3 PPD previously.   Review of systems:     No chest pain, no SOB, no fevers, no urinary sx   Past Medical History:  Diagnosis Date  . Anxiety   . Bronchitis   . GERD (gastroesophageal reflux disease)   . Insomnia     Patient's surgical history, family medical history, social history, medications and allergies were all reviewed in Epic    Current Outpatient Medications  Medication Sig Dispense Refill  . albuterol (VENTOLIN HFA) 108 (90 Base) MCG/ACT inhaler Inhale 2 puffs into the lungs every 6 (six) hours as needed for wheezing or shortness of breath. 18 g 5  . aspirin EC 81 MG tablet Take 81 mg by mouth daily.     . benzonatate (TESSALON) 100 MG capsule Take 1 capsule (100 mg total) by mouth 2 (two) times daily as needed for cough. 30 capsule 1  . Calcium-Magnesium-Vitamin D 600-40-500 MG-MG-UNIT TB24 Take 1 tablet by mouth daily.    . diclofenac sodium (VOLTAREN) 1 % GEL Apply 2 g topically 4 (four) times daily. 967 g 2  . folic acid (FOLVITE) 1 MG tablet Take 1 tablet (1 mg total) by mouth daily. 30 tablet 1  . furosemide (LASIX) 20 MG tablet Take 1 tablet (20 mg total) by mouth daily. 30 tablet 11  . Multiple Vitamins-Minerals (ONE-A-DAY MENS VITACRAVES) CHEW Chew 1 tablet by mouth daily.    Marland Kitchen  nadolol (CORGARD) 20 MG tablet Take 1 tablet (20 mg total) by mouth daily. 30 tablet 1  . pantoprazole (PROTONIX) 40 MG tablet Take 1 tablet (40 mg total) by mouth daily. 30 tablet 1  . potassium chloride SA (K-DUR) 20 MEQ tablet Take 1 tablet (20 mEq total) by mouth daily. 30 tablet 1  . spironolactone (ALDACTONE) 50 MG tablet Take 1 tablet (50 mg total) by mouth daily. 30 tablet 1  . thiamine 100 MG tablet Take 1 tablet (100 mg total) by mouth daily. 30 tablet 1   . topiramate (TOPAMAX) 25 MG tablet Take 1 tablet (25 mg total) by mouth daily. 30 tablet 1  . vitamin C (ASCORBIC ACID) 250 MG tablet Take 250 mg by mouth daily.     No current facility-administered medications for this visit.     Physical Exam:     BP 112/68   Pulse 74   Temp 98.2 F (36.8 C)   Ht 6' (1.829 m)   Wt 171 lb 2 oz (77.6 kg)   BMI 23.21 kg/m   GENERAL:  Pleasant male in NAD PSYCH: : Cooperative, normal affect EENT:  conjunctiva pink, mucous membranes moist, neck supple without masses CARDIAC:  RRR, no murmur heard, no peripheral edema PULM: Normal respiratory effort, lungs CTA bilaterally, no wheezing ABDOMEN:  Nondistended, soft, nontender. No obvious masses, no hepatomegaly,  normal bowel sounds SKIN:  turgor, no lesions seen Musculoskeletal:  Normal muscle tone, normal strength NEURO: Alert and oriented x 3, no focal neurologic deficits   IMPRESSION and PLAN:    1) Cirrhosis 2) Ascites 3) Alcohol use disorder 4) HCV 5) Esophageal varices 6) Coagulopathy 7) Portal hypertensive gastropathy  58 year old male with newly diagnosed decompensated cirrhosis 2/2 EtOH/HCV.  Complicated by ascites, esophageal varices, PHG.  Has since stopped drinking all alcohol.  -Resume Lasix/Aldactone -Resume nadolol for primary prophylaxis.  Heart rate 71 today.  If similar on follow-up, can maybe uptitrate to goal HR 55-60 - Can consider repeat EGD for EV surveillance along with evaluation for mucosal healing of moderate PHG with active oozing during inpatient - Needs to maintain complete abstinence of all alcohol -Applauded him on maintaining abstinence from alcohol intake.  However, my recommendation is that he additionally stop bud 0, as this likely serves as a constant temptation to drink again.  Additionally asked him to evaluate the sodium content of those drinks -Resume low-sodium diet - Check HAV for immunity -Needs HBV vaccine series -Flu vaccine -Referral to  [facility] for HCV treatment -Long discussion regarding the pathophysiology of cirrhosis, to include risk of progression of disease.  Current MELD 10, so no plan for referral to OLT eval  8) Iron Deficiency Anemia -Likely 2/2 active gastric oozing at time of EGD.  Treated with PRBCs and IV iron infusions. - Repeat CBC and iron panel in 2-3 months  9) Tobacco use disorder - Has cut down from 3 PPD to 1/2 PPD.  Recommended complete cessation  10) Tubular adenomas: -Repeat colonoscopy in 2023 for ongoing surveillance   I spent a total of 25 minutes of face-to-face time with the patient. Greater than 50% of the time was spent counseling and coordinating care.    Lavena Bullion ,DO, FACG 02/19/2019, 4:23 PM

## 2019-02-19 NOTE — Patient Instructions (Addendum)
If you are age 59 or older, your body mass index should be between 23-30. Your Body mass index is 23.21 kg/m. If this is out of the aforementioned range listed, please consider follow up with your Primary Care Provider.  If you are age 17 or younger, your body mass index should be between 19-25. Your Body mass index is 23.21 kg/m. If this is out of the aformentioned range listed, please consider follow up with your Primary Care Provider.   To help prevent the possible spread of infection to our patients, communities, and staff; we will be implementing the following measures:  As of now we are not allowing any visitors/family members to accompany you to any upcoming appointments with [facility]. If you have any concerns about this please contact our office to discuss prior to the appointment.   Your provider has requested that you go to the basement level for lab work at our [place] location ([address] [place] [address]) . Press "B" on the elevator. The lab is located at the first door on the left as you exit the elevator. You may go at whatever time is convienent for you. The current hours of operations are Monday- Friday 7:30am-4:30pm.  We have sent a referral to [facility] in [place]. You should receive a call from them to schedule an appointment soon.  Please call our office at [phone] to set up your 3 month follow up visit.  It was a pleasure to see you today!  Vito Cirigliano, D.O.

## 2019-02-26 ENCOUNTER — Other Ambulatory Visit: Payer: Self-pay | Admitting: Internal Medicine

## 2019-03-04 ENCOUNTER — Other Ambulatory Visit: Payer: PPO

## 2019-03-04 DIAGNOSIS — R188 Other ascites: Secondary | ICD-10-CM

## 2019-03-04 DIAGNOSIS — F17200 Nicotine dependence, unspecified, uncomplicated: Secondary | ICD-10-CM

## 2019-03-04 DIAGNOSIS — B1920 Unspecified viral hepatitis C without hepatic coma: Secondary | ICD-10-CM

## 2019-03-04 DIAGNOSIS — K7460 Unspecified cirrhosis of liver: Secondary | ICD-10-CM

## 2019-03-04 DIAGNOSIS — D509 Iron deficiency anemia, unspecified: Secondary | ICD-10-CM

## 2019-03-04 DIAGNOSIS — Z86010 Personal history of colonic polyps: Secondary | ICD-10-CM

## 2019-03-05 LAB — HEPATITIS A ANTIBODY, TOTAL: Hepatitis A AB,Total: NONREACTIVE

## 2019-03-11 ENCOUNTER — Encounter (INDEPENDENT_AMBULATORY_CARE_PROVIDER_SITE_OTHER): Payer: Self-pay

## 2019-03-11 ENCOUNTER — Ambulatory Visit (INDEPENDENT_AMBULATORY_CARE_PROVIDER_SITE_OTHER): Payer: PPO

## 2019-03-11 ENCOUNTER — Other Ambulatory Visit: Payer: Self-pay

## 2019-03-11 DIAGNOSIS — B1920 Unspecified viral hepatitis C without hepatic coma: Secondary | ICD-10-CM

## 2019-03-11 DIAGNOSIS — Z789 Other specified health status: Secondary | ICD-10-CM

## 2019-03-11 DIAGNOSIS — Z23 Encounter for immunization: Secondary | ICD-10-CM

## 2019-03-11 NOTE — Patient Instructions (Signed)
If you are age 59 or older, your body mass index should be between 23-30. Your There is no height or weight on file to calculate BMI. If this is out of the aforementioned range listed, please consider follow up with your Primary Care Provider.  If you are age 64 or younger, your body mass index should be between 19-25. Your There is no height or weight on file to calculate BMI. If this is out of the aformentioned range listed, please consider follow up with your Primary Care Provider.   To help prevent the possible spread of infection to our patients, communities, and staff; we will be implementing the following measures:  As of now we are not allowing any visitors/family members to accompany you to any upcoming appointments with [facility]. If you have any concerns about this please contact our office to discuss prior to the appointment.   It was a pleasure to see you today!  Vito Cirigliano, D.O.  

## 2019-03-20 ENCOUNTER — Other Ambulatory Visit: Payer: Self-pay

## 2019-04-07 ENCOUNTER — Ambulatory Visit (INDEPENDENT_AMBULATORY_CARE_PROVIDER_SITE_OTHER): Payer: PPO | Admitting: Internal Medicine

## 2019-04-07 ENCOUNTER — Other Ambulatory Visit: Payer: Self-pay

## 2019-04-07 ENCOUNTER — Encounter: Payer: Self-pay | Admitting: Internal Medicine

## 2019-04-07 DIAGNOSIS — J449 Chronic obstructive pulmonary disease, unspecified: Secondary | ICD-10-CM | POA: Diagnosis not present

## 2019-04-07 DIAGNOSIS — K739 Chronic hepatitis, unspecified: Secondary | ICD-10-CM

## 2019-04-07 DIAGNOSIS — F1029 Alcohol dependence with unspecified alcohol-induced disorder: Secondary | ICD-10-CM

## 2019-04-07 NOTE — Progress Notes (Signed)
Subjective:    Patient ID: William Whitaker, male    DOB: May 06, 1961, 58 y.o.   MRN: [id number]  DOS:  04/07/2019 Type of visit - description: Attempted  to make this a video visit, due to technical difficulties from the patient side it was not possible  thus we proceeded with a Virtual Visit via Telephone    I connected with@   by telephone and verified that I am speaking with the correct person using two identifiers.  THIS ENCOUNTER IS A VIRTUAL VISIT DUE TO COVID-19 - PATIENT WAS NOT SEEN IN THE OFFICE. PATIENT HAS CONSENTED TO VIRTUAL VISIT / TELEMEDICINE VISIT   Location of patient: home  Location of provider: office  I discussed the limitations, risks, security and privacy concerns of performing an evaluation and management service by telephone and the availability of in person appointments. I also discussed with the patient that there may be a patient responsible charge related to this service. The patient expressed understanding and agreed to proceed.   History of Present Illness: Routine visit Since the last office visit, he saw GI, was recommended complete alcohol abstinence. Hepatitis a serology negative, received first hepatitis B vaccination and was referred for hepatitis C treatment elsewhere. Tobacco: Has cut down significantly Medication list reconciliated  Review of Systems Overall feels better Denies fever chills No nausea, vomiting, diarrhea.   Past Medical History:  Diagnosis Date  . Anxiety   . Bronchitis   . GERD (gastroesophageal reflux disease)   . Insomnia     Past Surgical History:  Procedure Laterality Date  . BIOPSY  01/27/2019   Procedure: BIOPSY;  Surgeon: Irving Copas., MD;  Location: [facility];  Service: Gastroenterology;;  . COLONOSCOPY WITH PROPOFOL N/A 01/27/2019   Procedure: COLONOSCOPY WITH PROPOFOL;  Surgeon: Irving Copas., MD;  Location: [facility];  Service: Gastroenterology;  Laterality: N/A;  .  ESOPHAGOGASTRODUODENOSCOPY (EGD) WITH PROPOFOL N/A 01/27/2019   Procedure: ESOPHAGOGASTRODUODENOSCOPY (EGD) WITH PROPOFOL;  Surgeon: Rush Landmark Telford Nab., MD;  Location: [facility];  Service: Gastroenterology;  Laterality: N/A;  . HERNIA REPAIR Right remotely as a baby  . POLYPECTOMY  01/27/2019   Procedure: POLYPECTOMY;  Surgeon: Mansouraty, Telford Nab., MD;  Location: [facility];  Service: Gastroenterology;;    Social History   Socioeconomic History  . Marital status: Married    Spouse name: Not on file  . Number of children: Not on file  . Years of education: Not on file  . Highest education level: Not on file  Occupational History  . Occupation: disability d/t COPD  Social Needs  . Financial resource strain: Not on file  . Food insecurity    Worry: Not on file    Inability: Not on file  . Transportation needs    Medical: Not on file    Non-medical: Not on file  Tobacco Use  . Smoking status: Current Every Day Smoker    Packs/day: 0.50    Years: 25.00    Pack years: 12.50    Types: Cigarettes  . Smokeless tobacco: Never Used  . Tobacco comment: 3 PPD, started to devrease to 1/2 ppd 2019  Substance and Sexual Activity  . Alcohol use: Not Currently    Alcohol/week: 60.0 standard drinks    Types: 60 Cans of beer per week    Comment: 3-4 beers a day  . Drug use: No  . Sexual activity: Not on file  Lifestyle  . Physical activity    Days per week: Not on  file    Minutes per session: Not on file  . Stress: Not on file  Relationships  . Social Herbalist on phone: Not on file    Gets together: Not on file    Attends religious service: Not on file    Active member of club or organization: Not on file    Attends meetings of clubs or organizations: Not on file    Relationship status: Not on file  . Intimate partner violence    Fear of current or ex partner: Not on file    Emotionally abused: Not on file    Physically abused: Not on file    Forced sexual  activity: Not on file  Other Topics Concern  . Not on file  Social History Narrative  . Not on file      Allergies as of 04/07/2019   No Known Allergies     Medication List       Accurate as of April 07, 2019 11:59 PM. If you have any questions, ask your nurse or doctor.        STOP taking these medications   benzonatate 100 MG capsule Commonly known as: TESSALON Stopped by: Kathlene November, MD   folic acid 1 MG tablet Commonly known as: FOLVITE Stopped by: Kathlene November, MD   thiamine 100 MG tablet Stopped by: Kathlene November, MD     TAKE these medications   albuterol 108 (90 Base) MCG/ACT inhaler Commonly known as: VENTOLIN HFA Inhale 2 puffs into the lungs every 6 (six) hours as needed for wheezing or shortness of breath.   aspirin EC 81 MG tablet Take 81 mg by mouth daily.   Calcium-Magnesium-Vitamin D 600-40-500 MG-MG-UNIT Tb24 Take 1 tablet by mouth daily.   diclofenac sodium 1 % Gel Commonly known as: Voltaren Apply 2 g topically 4 (four) times daily.   furosemide 20 MG tablet Commonly known as: Lasix Take 1 tablet (20 mg total) by mouth daily.   nadolol 20 MG tablet Commonly known as: CORGARD Take 1 tablet (20 mg total) by mouth daily.   One-A-Day Mens VitaCraves Chew Chew 1 tablet by mouth daily.   pantoprazole 40 MG tablet Commonly known as: Protonix Take 1 tablet (40 mg total) by mouth daily.   potassium chloride SA 20 MEQ tablet Commonly known as: KLOR-CON Take 1 tablet (20 mEq total) by mouth daily.   spironolactone 50 MG tablet Commonly known as: ALDACTONE Take 1 tablet (50 mg total) by mouth daily.   topiramate 25 MG tablet Commonly known as: TOPAMAX Take 1 tablet (25 mg total) by mouth daily.   vitamin C 250 MG tablet Commonly known as: ASCORBIC ACID Take 250 mg by mouth daily.           Objective:   Physical Exam There were no vitals taken for this visit. This is a virtual phone visit. He sounded alert oriented x3, in no apparent  distress  l    Assessment     ASSESSMENT (referred by [name])  COPD Anxiety GERD H/o ETOH and Heavy tobacco use (3ppd)  PLAN COPD: Has cut down on tobacco, present, remind him goal is to complete abstinence .  Uses inhalers as needed History of EtOH: Since the last visit had 3 beers total.  I again encouraged him total abstinence Cirrhosis of the liver, last note from GI reviewed. Hepatitis C: Referred to a liver specialist, plans to go in few days. Blood work:  needs a  CMP and CBC from my standpoint but he will see the liver specialist soon, advised patient to call me if blood work is not done. Flu shot: Strongly recommended RTC 4 months    I discussed the assessment and treatment plan with the patient. The patient was provided an opportunity to ask questions and all were answered. The patient agreed with the plan and demonstrated an understanding of the instructions.   The patient was advised to call back or seek an in-person evaluation if the symptoms worsen or if the condition fails to improve as anticipated.  I provided 17 minutes of non-face-to-face time during this encounter.  Kathlene November, MD

## 2019-04-09 DIAGNOSIS — J449 Chronic obstructive pulmonary disease, unspecified: Secondary | ICD-10-CM | POA: Insufficient documentation

## 2019-04-09 NOTE — Assessment & Plan Note (Signed)
COPD: Has cut down on tobacco, present, remind him goal is to complete abstinence .  Uses inhalers as needed History of EtOH: Since the last visit had 3 beers total.  I again encouraged him total abstinence Cirrhosis of the liver, last note from GI reviewed. Hepatitis C: Referred to a liver specialist, plans to go in few days. Blood work:  needs a CMP and CBC from my standpoint but he will see the liver specialist soon, advised patient to call me if blood work is not done. Flu shot: Strongly recommended RTC 4 months

## 2019-04-14 DIAGNOSIS — K7031 Alcoholic cirrhosis of liver with ascites: Secondary | ICD-10-CM | POA: Diagnosis not present

## 2019-04-14 DIAGNOSIS — B182 Chronic viral hepatitis C: Secondary | ICD-10-CM | POA: Diagnosis not present

## 2019-04-14 DIAGNOSIS — K219 Gastro-esophageal reflux disease without esophagitis: Secondary | ICD-10-CM | POA: Diagnosis not present

## 2019-04-14 DIAGNOSIS — F1010 Alcohol abuse, uncomplicated: Secondary | ICD-10-CM | POA: Diagnosis not present

## 2019-04-20 ENCOUNTER — Other Ambulatory Visit: Payer: Self-pay | Admitting: Internal Medicine

## 2019-04-21 ENCOUNTER — Other Ambulatory Visit: Payer: Self-pay

## 2019-04-21 ENCOUNTER — Ambulatory Visit (INDEPENDENT_AMBULATORY_CARE_PROVIDER_SITE_OTHER): Payer: PPO | Admitting: Gastroenterology

## 2019-04-21 DIAGNOSIS — Z23 Encounter for immunization: Secondary | ICD-10-CM | POA: Diagnosis not present

## 2019-04-21 NOTE — Progress Notes (Signed)
Patient need twinrix in November 2020 and April 2021

## 2019-04-23 DIAGNOSIS — K7469 Other cirrhosis of liver: Secondary | ICD-10-CM | POA: Diagnosis not present

## 2019-04-23 DIAGNOSIS — B182 Chronic viral hepatitis C: Secondary | ICD-10-CM | POA: Diagnosis not present

## 2019-05-05 DIAGNOSIS — K7031 Alcoholic cirrhosis of liver with ascites: Secondary | ICD-10-CM | POA: Diagnosis not present

## 2019-05-05 DIAGNOSIS — B182 Chronic viral hepatitis C: Secondary | ICD-10-CM | POA: Diagnosis not present

## 2019-05-08 ENCOUNTER — Telehealth: Payer: Self-pay | Admitting: Gastroenterology

## 2019-05-08 NOTE — Telephone Encounter (Signed)
Called and spoke with patient-patient advise of MD recommendations and is agreeable to keep the f/u OV appt on 05/26/2019; Patient advised to call back to the office at [phone] should questions/concerns arise;  Patient verbalized understanding of information/instructions;

## 2019-05-08 NOTE — Telephone Encounter (Signed)
Does this patient's appt need to be moved sooner? Please advise after reviewing previous message

## 2019-05-08 NOTE — Telephone Encounter (Signed)
Nothing concerning, just wanted to have him seen in the GI clinic for ongoing monitoring/management of his ascites. I saw that he was seen in the [facility], and planning on f/u with them in Jan 2021 and possible initiation of HCV treatment.  The appt on 11/23 is perfectly appropriate.

## 2019-05-20 ENCOUNTER — Other Ambulatory Visit: Payer: Self-pay | Admitting: Internal Medicine

## 2019-05-21 ENCOUNTER — Other Ambulatory Visit: Payer: Self-pay | Admitting: Internal Medicine

## 2019-05-26 ENCOUNTER — Ambulatory Visit: Payer: PPO | Admitting: Gastroenterology

## 2019-06-30 ENCOUNTER — Other Ambulatory Visit: Payer: Self-pay

## 2019-06-30 ENCOUNTER — Ambulatory Visit (INDEPENDENT_AMBULATORY_CARE_PROVIDER_SITE_OTHER): Payer: PPO | Admitting: Gastroenterology

## 2019-06-30 ENCOUNTER — Encounter: Payer: Self-pay | Admitting: Gastroenterology

## 2019-06-30 VITALS — BP 118/68 | HR 96 | Temp 98.9°F | Ht 72.0 in | Wt 178.0 lb

## 2019-06-30 DIAGNOSIS — R188 Other ascites: Secondary | ICD-10-CM

## 2019-06-30 DIAGNOSIS — I8500 Esophageal varices without bleeding: Secondary | ICD-10-CM | POA: Diagnosis not present

## 2019-06-30 DIAGNOSIS — K766 Portal hypertension: Secondary | ICD-10-CM | POA: Diagnosis not present

## 2019-06-30 DIAGNOSIS — F17200 Nicotine dependence, unspecified, uncomplicated: Secondary | ICD-10-CM | POA: Diagnosis not present

## 2019-06-30 DIAGNOSIS — N62 Hypertrophy of breast: Secondary | ICD-10-CM

## 2019-06-30 DIAGNOSIS — Z860100 Personal history of colon polyps, unspecified: Secondary | ICD-10-CM

## 2019-06-30 DIAGNOSIS — B1920 Unspecified viral hepatitis C without hepatic coma: Secondary | ICD-10-CM

## 2019-06-30 DIAGNOSIS — K7460 Unspecified cirrhosis of liver: Secondary | ICD-10-CM | POA: Diagnosis not present

## 2019-06-30 DIAGNOSIS — K3189 Other diseases of stomach and duodenum: Secondary | ICD-10-CM

## 2019-06-30 DIAGNOSIS — Z23 Encounter for immunization: Secondary | ICD-10-CM | POA: Diagnosis not present

## 2019-06-30 DIAGNOSIS — D509 Iron deficiency anemia, unspecified: Secondary | ICD-10-CM | POA: Diagnosis not present

## 2019-06-30 MED ORDER — PROPRANOLOL HCL 20 MG PO TABS: 20.0000 mg | ORAL_TABLET | Freq: Two times a day (BID) | ORAL | 3 refills | Status: DC

## 2019-06-30 NOTE — Patient Instructions (Signed)
We have sent the following medications to your pharmacy for you to pick up at your convenience: Propranolol  STOP NADOLOL   Return to office in 3-6 months  It was a pleasure to see you today!  Vito Cirigliano, D.O.

## 2019-06-30 NOTE — Progress Notes (Signed)
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  Treated with large volume paracentesis (SAAG >1.1, -SBP, cytology negative) and started on Lasix/Aldactone.  EGD with EV 1 and 2, PHG.  Given PRBC x2, Feraheme x2 for severe IDA (presumed d/t PHG with oozing).  Started on nadolol for primary prophylaxis and Protonix.  Cirrhosis Evaluation: - Etiology: Hepatitis C (intranasal cocaine 20+ years ago), alcohol - Complications: Ascites, esophageal varices, coagulapthy  - HCC screening: AFP- 01/2019; CT w/o Aguilar 01/2019 - Variceal screening: EGD 01/2019- grade 1 and grade 2 varices - Serologic evaluation: ASMA mildly elevated at 20 (ULN 19). Otherwise negative - Viral hepatitis vaccination: Started HAV/HBV vaccine series (dose #2 Twinrix today) - Flu vaccine: UTD- fall 2020 - Liver biopsy: None  - Medications: Lasix 20 mg, Aldactone 50 mg, nadolol 20 mg -MELD: 10 - Child Pugh score: B (8 points)  -HCV antibody positive -HCV viral load: 2.1 million copies  Endoscopic history: - EGD (01/2019, Dr. Rush Landmark): Grade 1 and grade 2 varices, diffuse portal hypertensive gastropathy with oozing -Colonoscopy (01/2019, Dr. Rush Landmark): 4 small tubular adenomas, internal hemorrhoids.  Repeat in 3 years  HPI:     Patient is a 58 y.o. male presenting [facility] for follow-up.  Last seen by me in 02/2019 for hospital follow-up.  Was doing well at that time.  No EtOH since hospital discharge in 01/2019.  Had decreased smoking to 1/2 PPD from 3 PPD previously.  Today, he states still doing well. Has had a couple of beers over the holidays (2-3 in total over the last couple weeks). Still drinks has Bud Zero.  Taking Lasix, Aldactone as prescribed. Stopped taking nadolol since he  felt this was making him tired and unable to get out of bed in the morning. Noticed those side effects stopped after he stopped the medication.  Still low-sodium diet.  No new labs or imaging studies for review today.  Will order as below.  Was seen by Roosevelt Locks at [facility] in 05/2019 for treatment of HCV in cirrhosis evaluation; planning on starting Epclus next month.  As f/u appointment scheduled and plan for [facility] screening next month as well.  Review of systems:     No chest pain, no SOB, no fevers, no urinary sx   Past Medical History:  Diagnosis Date  . Anxiety   . Bronchitis   . GERD (gastroesophageal reflux disease)   . Insomnia     Patient's surgical history, family medical history, social history, medications and allergies were all reviewed in Epic    Current Outpatient Medications  Medication Sig Dispense Refill  . albuterol (VENTOLIN HFA) 108 (90 Base) MCG/ACT inhaler Inhale 2 puffs into the lungs every 6 (six) hours as needed for wheezing or shortness of breath. 18 g 5  . aspirin EC 81 MG tablet Take 81 mg by mouth daily.     . Calcium-Magnesium-Vitamin D [id number] MG-MG-UNIT TB24 Take 1 tablet by mouth daily.    . diclofenac sodium (VOLTAREN) 1 % GEL Apply 2 g topically 4 (four) times daily. 100 g 2  . furosemide (LASIX) 20 MG tablet Take 1 tablet (20 mg total) by mouth daily. 30 tablet 11  . Multiple Vitamins-Minerals (ONE-A-DAY MENS VITACRAVES) CHEW Chew 1 tablet by mouth daily.    Marland Kitchen  nadolol (CORGARD) 20 MG tablet Take 1 tablet (20 mg total) by mouth daily. 30 tablet 3  . omeprazole (PRILOSEC) 20 MG capsule Take 20 mg by mouth daily.    . potassium chloride SA (KLOR-CON M20) 20 MEQ tablet Take 1 tablet (20 mEq total) by mouth daily. 30 tablet 5  . spironolactone (ALDACTONE) 50 MG tablet Take 1 tablet (50 mg total) by mouth daily. 30 tablet 3  . topiramate (TOPAMAX) 25 MG tablet Take 1 tablet (25 mg total) by mouth daily. 90 tablet 1  . vitamin C (ASCORBIC ACID)  250 MG tablet Take 250 mg by mouth daily.     No current facility-administered medications for this visit.    Physical Exam:     BP 118/68   Pulse 96   Temp 98.9 F (37.2 C)   Ht 6' (1.829 m)   Wt 178 lb (80.7 kg)   BMI 24.14 kg/m   GENERAL:  Pleasant male in NAD PSYCH: : Cooperative, normal affect EENT:  conjunctiva pink, mucous membranes moist, neck supple without masses CARDIAC:  RRR, no murmur heard, no peripheral edema PULM: Normal respiratory effort, lungs CTA bilaterally, no wheezing ABDOMEN:  Nondistended, soft, nontender. No obvious masses, no hepatomegaly,  normal bowel sounds SKIN:  turgor, no lesions seen Musculoskeletal:  Normal muscle tone, normal strength NEURO: Alert and oriented x 3, no focal neurologic deficits   IMPRESSION and PLAN:    1) Cirrhosis 2) Ascites 3) Alcohol use disorder 4) Esophageal varices 5) Coagulopathy 6) Portal hypertensive gastropathy  58 year old male with decompensated cirrhosis 2/2 EtOH/HCV, complicated by ascites, esophageal varices, PHG.    -Resume Lasix/Aldactone. Ascites currently well controlled -Not tolerating nadolol (drowsiness, difficulty getting out of bed in the morning), so he stopped on his own. Discussed starting propranolol vs serial EGD with variceal band ligation for primary prophylaxis, and he opted for trialing propranolol -Stop nadolol. Start propranolol 20 mg bid, with plan to titrate to HR 55-60. If not tolerating, will plan for serial EVL -Consider repeat EGD in 01/2020 to assess varices decompression and healing of moderate PHG (surveillance EGD not necessarily needed when on BB primary prophylaxis) - Extensively counseled on the need for complete abstinence of all alcohol. Would benefit from formal alcohol counseling needs to maintain complete abstinence of all alcohol -Resume low-sodium diet -Complete HAV/HBV vaccine series -UTD on flu vaccine -HCC screening in 07/2019 as outlined by [place] with Roosevelt Locks  at [place] in 07/2019 as scheduled for HCV treatment and ongoing cirrhosis management -Repeat BMP with labs next month  7) Chronic hepatitis C: -Planning on starting Epclusa in 07/2019. May need to treat with extended 24-week course given cirrhosis and COPD and history of anemia.  Defer treatment plan to [place] with [facility] as above  8) Gynecomastia: -Minimal gynecomastia, likely 2/2 Aldactone. Discussed changing therapy, but he feels benefits of medication far outweigh the risk/ADR profile. No change at this time  9) Iron Deficiency Anemia -Likely 2/2 active gastric oozing at time of EGD.  Treated with PRBCs and IV iron infusions. - Repeat CBC and iron panel with routine labs next month  10) Tobacco use disorder - Has increased his smoking again recently during the holidays, but still nowhere near his previous 3 PPD. Extended smoking cessation counseling today.   11) Tubular adenomas: -Repeat colonoscopy in 2023 for ongoing surveillance   I spent a total of 25 minutes of face-to-face time with the patient. Greater than 50% of  the time was spent counseling and coordinating care.    [place] ,DO, FACG 06/30/2019, 3:20 PM

## 2019-07-24 ENCOUNTER — Other Ambulatory Visit: Payer: Self-pay | Admitting: Internal Medicine

## 2019-07-30 ENCOUNTER — Other Ambulatory Visit: Payer: Self-pay | Admitting: Nurse Practitioner

## 2019-07-30 DIAGNOSIS — K7469 Other cirrhosis of liver: Secondary | ICD-10-CM

## 2019-08-06 ENCOUNTER — Other Ambulatory Visit: Payer: Self-pay

## 2019-08-06 ENCOUNTER — Ambulatory Visit: Payer: PPO | Admitting: Internal Medicine

## 2019-08-06 ENCOUNTER — Encounter: Payer: Self-pay | Admitting: Internal Medicine

## 2019-08-06 ENCOUNTER — Ambulatory Visit (INDEPENDENT_AMBULATORY_CARE_PROVIDER_SITE_OTHER): Payer: Medicare Other | Admitting: Internal Medicine

## 2019-08-06 VITALS — Ht 72.0 in

## 2019-08-06 DIAGNOSIS — J449 Chronic obstructive pulmonary disease, unspecified: Secondary | ICD-10-CM

## 2019-08-06 DIAGNOSIS — F1029 Alcohol dependence with unspecified alcohol-induced disorder: Secondary | ICD-10-CM

## 2019-08-06 DIAGNOSIS — K739 Chronic hepatitis, unspecified: Secondary | ICD-10-CM | POA: Diagnosis not present

## 2019-08-06 DIAGNOSIS — F17200 Nicotine dependence, unspecified, uncomplicated: Secondary | ICD-10-CM

## 2019-08-06 MED ORDER — SPIRIVA HANDIHALER 18 MCG IN CAPS: 18.0000 ug | ORAL_CAPSULE | Freq: Every day | RESPIRATORY_TRACT | 12 refills | Status: DC

## 2019-08-06 NOTE — Progress Notes (Signed)
Subjective:    Patient ID: William Whitaker, male    DOB: 03/05/1961, 59 y.o.   MRN: [id number]  DOS:  08/06/2019 Type of visit - description: Virtual Visit via Telephone  Attempted  to make this a video visit, due to technical difficulties from the patient side it was not possible  thus we proceeded with a Virtual Visit via Telephone    I connected with above mentioned patient  by telephone and verified that I am speaking with the correct person using two identifiers.  THIS ENCOUNTER IS A VIRTUAL VISIT DUE TO COVID-19 - PATIENT WAS NOT SEEN IN THE OFFICE. PATIENT HAS CONSENTED TO VIRTUAL VISIT / TELEMEDICINE VISIT   Location of patient: home  Location of provider: office  I discussed the limitations, risks, security and privacy concerns of performing an evaluation and management service by telephone and the availability of in person appointments. I also discussed with the patient that there may be a patient responsible charge related to this service. The patient expressed understanding and agreed to proceed.  Follow-up Since the last office visit, saw the hepatitis C specialist. He also reports chronic cough, increased for the last 4 to 5 days, is using his rescue inhaler at least every 6 hours if not more. The patient does not believe increasing cough is related to Covid because the lack of other symptoms such as nausea, vomiting, diarrhea or myalgias. We also talk about tobacco and alcohol consumption.    Review of Systems Denies fever chills No chest pain, difficulty breathing at baseline. Mild sputum production, clear, no hemoptysis  .  Past Medical History:  Diagnosis Date  . Anxiety   . Bronchitis   . GERD (gastroesophageal reflux disease)   . Insomnia     Past Surgical History:  Procedure Laterality Date  . BIOPSY  01/27/2019   Procedure: BIOPSY;  Surgeon: Irving Copas., MD;  Location: [facility];  Service: Gastroenterology;;  . COLONOSCOPY WITH PROPOFOL N/A  01/27/2019   Procedure: COLONOSCOPY WITH PROPOFOL;  Surgeon: Irving Copas., MD;  Location: [facility];  Service: Gastroenterology;  Laterality: N/A;  . ESOPHAGOGASTRODUODENOSCOPY (EGD) WITH PROPOFOL N/A 01/27/2019   Procedure: ESOPHAGOGASTRODUODENOSCOPY (EGD) WITH PROPOFOL;  Surgeon: Rush Landmark Telford Nab., MD;  Location: [facility];  Service: Gastroenterology;  Laterality: N/A;  . HERNIA REPAIR Right remotely as a baby  . POLYPECTOMY  01/27/2019   Procedure: POLYPECTOMY;  Surgeon: Mansouraty, Telford Nab., MD;  Location: [facility];  Service: Gastroenterology;;        Objective:   Physical Exam Ht 6' (1.829 m)   BMI 24.14 kg/m  This is a virtual video visit, he is alert oriented x3, speaking in complete sentences, in no apparent distress    Assessment     ASSESSMENT (referred by [name])  COPD Anxiety GERD H/o ETOH and Heavy tobacco use (3ppd)  PLAN COPD: The patient has chronic cough, slightly worse on the last 5 days, has been using his inhaler at least 4 times a day even before the cough increase recently. Plan: Add Spiriva, continue rescue inhaler hopefully with less frequency .  Add Mucinex or Robitussin.  He will call in few days if he is not getting better, will consider antibiotic. H/O EtOH: Reports he is not drinking at this point Hepatitis C: Reports he has seen the specialist had blood work yesterday.  Results not available to me. Cirrhosis: Reports his weight has been stable at 170 pounds at home.  Good med compliance. RTC  4 months CPX.     I discussed the assessment and treatment plan with the patient. The patient was provided an opportunity to ask questions and all were answered. The patient agreed with the plan and demonstrated an understanding of the instructions.   The patient was advised to call back or seek an in-person evaluation if the symptoms worsen or if the condition fails to improve as anticipated.  I provided  22 minutes of  non-face-to-face time during this encounter.  Kathlene November, MD

## 2019-08-06 NOTE — Progress Notes (Signed)
Pre visit review using our clinic review tool, if applicable. No additional management support is needed unless otherwise documented below in the visit note. 

## 2019-08-07 NOTE — Assessment & Plan Note (Signed)
COPD: The patient has chronic cough, slightly worse on the last 5 days, has been using his inhaler at least 4 times a day even before the cough increase recently. Plan: Add Spiriva, continue rescue inhaler hopefully with less frequency .  Add Mucinex or Robitussin.  He will call in few days if he is not getting better, will consider antibiotic. H/O EtOH: Reports he is not drinking at this point Hepatitis C: Reports he has seen the specialist had blood work yesterday.  Results not available to me. Cirrhosis: Reports his weight has been stable at 170 pounds at home.  Good med compliance. RTC 4 months CPX.

## 2019-08-11 ENCOUNTER — Other Ambulatory Visit: Payer: Self-pay | Admitting: Internal Medicine

## 2019-08-11 MED ORDER — PROPRANOLOL HCL 20 MG PO TABS: 20.0000 mg | ORAL_TABLET | Freq: Two times a day (BID) | ORAL | 1 refills | Status: DC

## 2019-08-15 ENCOUNTER — Telehealth: Payer: Self-pay | Admitting: Internal Medicine

## 2019-08-15 MED ORDER — HYDROXYZINE HCL 10 MG PO TABS: 10.0000 mg | ORAL_TABLET | Freq: Three times a day (TID) | ORAL | 0 refills | Status: DC | PRN

## 2019-08-15 MED ORDER — ESCITALOPRAM OXALATE 10 MG PO TABS: 10.0000 mg | ORAL_TABLET | Freq: Every day | ORAL | 1 refills | Status: DC

## 2019-08-15 NOTE — Telephone Encounter (Signed)
Spoke w/ Pt- informed of recommendations. Pt verbalized understanding. [facility]apro [facility]mg  [facility]. Pt at another doctors office currently-will call back to schedule appt for next week.

## 2019-08-15 NOTE — Telephone Encounter (Signed)
Advise patient: Recommend to start a anxiety medication called Lexapro 10 mg, 1 tablet daily, send 30 and 1 refill. Please explained the patient that it will take few days to "kick in", in the meantime he can use a medication called hydroxyzine (Atarax) 3 times a day as needed for anxiety (I sent a Rx).  Watch for excessive drowsiness/sedation. Definitely seek medical attention if symptoms are severe, please arrange a visit for next week and we can discuss further.

## 2019-08-15 NOTE — Telephone Encounter (Signed)
Please advise 
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
## 2019-08-28 DIAGNOSIS — K7469 Other cirrhosis of liver: Secondary | ICD-10-CM | POA: Diagnosis not present

## 2019-08-28 DIAGNOSIS — K7460 Unspecified cirrhosis of liver: Secondary | ICD-10-CM | POA: Diagnosis not present

## 2019-08-28 DIAGNOSIS — K828 Other specified diseases of gallbladder: Secondary | ICD-10-CM | POA: Diagnosis not present

## 2019-09-06 ENCOUNTER — Other Ambulatory Visit: Payer: Self-pay | Admitting: Internal Medicine

## 2019-09-09 ENCOUNTER — Telehealth: Payer: Self-pay

## 2019-09-09 DIAGNOSIS — K7031 Alcoholic cirrhosis of liver with ascites: Secondary | ICD-10-CM

## 2019-09-09 DIAGNOSIS — D509 Iron deficiency anemia, unspecified: Secondary | ICD-10-CM

## 2019-09-09 DIAGNOSIS — J449 Chronic obstructive pulmonary disease, unspecified: Secondary | ICD-10-CM

## 2019-09-09 MED ORDER — YUPELRI 175 MCG/3ML IN SOLN: 1.0000 | Freq: Every day | RESPIRATORY_TRACT | 3 refills | Status: DC

## 2019-09-09 NOTE — Telephone Encounter (Signed)
Spoke w/ Pt- informed of recommendations. Pt will come by office today to pick up Rx for nebulizer- his old machine broke. [facility] Referral placed to [facility] to see if she can help w/ inhaler costs.

## 2019-09-09 NOTE — Telephone Encounter (Signed)
Please advise 

## 2019-09-09 NOTE — Telephone Encounter (Signed)
He can try: YUPELRI Nebulization solution: 175 mcg (1 unit-dose vial) once daily. We will need a prescription for the vials  and the nebulizer machine.

## 2019-09-09 NOTE — Telephone Encounter (Signed)
Patient called in to see if Dr. Larose Kells can send in a prescription for  An nebulizer machine. He can not afford the prescription for the in hauler that was sent in. Please follow up with the patient at [phone] thanks,

## 2019-09-11 ENCOUNTER — Telehealth: Payer: Self-pay

## 2019-09-11 NOTE — Telephone Encounter (Signed)
Patient called in to see if Dr. Larose Kells could put in a generic prescription for the nebulizer machine. Please follow up with the patient and advise him on what to do. Please call him at [phone]   Thanks,

## 2019-09-11 NOTE — Telephone Encounter (Signed)
PT called back to report that  [facility] wanted to charge him  $1,200 for meds. I provided patient's info to Melvenia Beam to see if she can offer assistance to patient

## 2019-09-12 NOTE — Telephone Encounter (Signed)
Patient wanted to know if there is something else he can take other than the Yupelri.  It is expensive.    They are working on getting the nebulizer machine for him.  He also wants to know if he can get the Covid vaccine now?  He has a lot of health issues.

## 2019-09-12 NOTE — Telephone Encounter (Signed)
Not much I can do about cost of medicines. He could continue albuterol as needed Add Atrovent HFA 2 puffs 4 times daily.  Send a prescription if patient agrees.

## 2019-09-15 MED ORDER — BENZONATATE 100 MG PO CAPS: 100.0000 mg | ORAL_CAPSULE | Freq: Three times a day (TID) | ORAL | 0 refills | Status: DC | PRN

## 2019-09-15 MED ORDER — IPRATROPIUM BROMIDE HFA 17 MCG/ACT IN AERS: 2.0000 | INHALATION_SPRAY | Freq: Four times a day (QID) | RESPIRATORY_TRACT | 5 refills | Status: DC

## 2019-09-15 NOTE — Telephone Encounter (Signed)
Patient agrees with Atrovent.  Atrovent sent in.    Now patient states he has a cough and would like to see if you can send in some benzonatate.  He had them in the past and it worked.    Also he would like to know if you would like to know if you recommend him getting the Covid vaccine, with his health issues.

## 2019-09-15 NOTE — Telephone Encounter (Signed)
Send benzonatate 100 mg 1 tablet 3 times daily as needed cough.  #30, no refills. I do recommend him to proceed with a Covid vaccination (as long as he is not sick w/ fever, bronchitis etc.)

## 2019-09-15 NOTE — Telephone Encounter (Signed)
Patient notified and rx sent in 

## 2019-09-24 DIAGNOSIS — J449 Chronic obstructive pulmonary disease, unspecified: Secondary | ICD-10-CM | POA: Diagnosis not present

## 2019-10-10 ENCOUNTER — Telehealth: Payer: Self-pay | Admitting: Internal Medicine

## 2019-10-10 NOTE — Chronic Care Management (AMB) (Signed)
  Chronic Care Management   Note  10/10/2019 Name: William Whitaker MRN: [id number] DOB: [birth year]  [name] is a 59 y.o. year old male who is a primary care patient of Colon Branch, MD. I reached out to [name] by phone today in response to a referral sent by William Whitaker PCP, Colon Branch, MD.   William Whitaker was given information about Chronic Care Management services today including:  1. CCM service includes personalized support from designated clinical staff supervised by his physician, including individualized plan of care and coordination with other care providers 2. 24/7 contact phone numbers for assistance for urgent and routine care needs. 3. Service will only be billed when office clinical staff spend 20 minutes or more in a month to coordinate care. 4. Only one practitioner may furnish and bill the service in a calendar month. 5. The patient may stop CCM services at any time (effective at the end of the month) by phone call to the office staff.   Patient agreed to services and verbal consent obtained.   Follow up plan:   Raynicia Dukes [employer] Scheduler

## 2019-10-23 ENCOUNTER — Other Ambulatory Visit: Payer: Self-pay | Admitting: Internal Medicine

## 2019-10-28 ENCOUNTER — Other Ambulatory Visit: Payer: Self-pay | Admitting: Internal Medicine

## 2019-11-03 ENCOUNTER — Other Ambulatory Visit: Payer: Self-pay

## 2019-11-03 ENCOUNTER — Ambulatory Visit: Payer: Medicare Other | Admitting: Pharmacist

## 2019-11-03 DIAGNOSIS — F17200 Nicotine dependence, unspecified, uncomplicated: Secondary | ICD-10-CM

## 2019-11-03 DIAGNOSIS — J449 Chronic obstructive pulmonary disease, unspecified: Secondary | ICD-10-CM

## 2019-11-03 MED ORDER — BENZONATATE 100 MG PO CAPS: ORAL_CAPSULE | ORAL | 0 refills | Status: DC

## 2019-11-03 NOTE — Chronic Care Management (AMB) (Signed)
Chronic Care Management Pharmacy  Name: William Whitaker  MRN: [id number] DOB: [birth year]  Chief Complaint/ HPI  William Whitaker,  59 y.o. , male presents for their Initial CCM visit with the clinical pharmacist via telephone due to COVID-19 Pandemic.  PCP : Colon Branch, MD  Their chronic conditions include: COPD/Tobacco Use Disorder, Cirrhosis, Hep C, Anxiety  Office Visits: 09/11/19: Patient call due to med cost. Pt unable to afford Yupelri. Needs new neb machine. Dr. Larose Kells added atrovent 2 puff QID. Pt also has cough and requests benzonatate. Benzonatate sent. Pt inquires about COVID vaccine. Dr. Larose Kells encouraged pt to receive vaccine as long as he is not sick.   09/09/19: Patient call due to med cost. Referral placed for CCM to assist with inhaler cost.  08/15/19: Pt call due to anxiety. He would like assistance so he can refrain from using beer. Dr. Larose Kells started escitalopram 10mg  daily plus hydroxyzine TID for anxiety noting escitalopram may take a few days to "kick in". RTC in week for follow up.  08/06/19: Visit w/ Dr. Larose Kells - COPD with chronic cough slightly worse. Add spiriva and mucinex or robitussin. If not better consider abx. Pt recently saw Hep C specialist. Labs drawn. Cirrhosis. Pt has avoided ETOH. Weight stable at 170. Good med adherence.   Consult Visit: 08/28/19: Ultrasound of Liver - Impression: Cirrhosis of liver w/o evidence for hepatic mass lesion by sonography. Small amount of gallblader sludge and probably tiny stones.   06/30/19: Gastro visit w/ Dr. Bryan Lemma - Pt stopped nadolol due to feeling tired and unable to get out of the bed. Plan to start Epclusa next month per HCV specialist, Dawn Drazek at [facility] hepatology (seen 05/2019). Resume lasix/aldactone. Start propranolol 20mg  BID for primary prophylaxis. Target HR 55-60. If not tolerated plan serial EVL. Consider repeat EGD 01/2020 to assess varices decompression and healing of moderate PHG. Complete HAV/HBV vaccine series.  Smoking cessation counseling.   Medications: Outpatient Encounter Medications as of 11/03/2019  Medication Sig Note  . albuterol (PROAIR HFA) 108 (90 Base) MCG/ACT inhaler Inhale 2 puffs into the lungs every 6 (six) hours as needed for wheezing or shortness of breath.   Marland Kitchen aspirin EC 81 MG tablet Take 81 mg by mouth daily.    . benzonatate (TESSALON) 100 MG capsule TAKE 1 CAPSULE BY MOUTH THREE TIMES A Adante Courington AS NEEDED FOR COUGH   . furosemide (LASIX) 20 MG tablet Take 1 tablet (20 mg total) by mouth daily.   . hydrOXYzine (ATARAX/VISTARIL) 10 MG tablet Take 1 tablet (10 mg total) by mouth 3 (three) times daily as needed.   Marland Kitchen ipratropium (ATROVENT HFA) 17 MCG/ACT inhaler Inhale 2 puffs into the lungs 4 (four) times daily.   . Multiple Vitamins-Minerals (ONE-A-[place]urley MENS VITACRAVES) CHEW Chew 1 tablet by mouth daily.   Marland Kitchen omeprazole (PRILOSEC) 20 MG capsule Take 20 mg by mouth daily.   . potassium chloride SA (KLOR-CON M20) 20 MEQ tablet Take 1 tablet (20 mEq total) by mouth daily.   . propranolol (INDERAL) 20 MG tablet Take 1 tablet (20 mg total) by mouth 2 (two) times daily.   Marland Kitchen spironolactone (ALDACTONE) 50 MG tablet Take 1 tablet (50 mg total) by mouth daily.   Marland Kitchen topiramate (TOPAMAX) 25 MG tablet Take 1 tablet (25 mg total) by mouth daily.   . vitamin C (ASCORBIC ACID) 250 MG tablet Take 250 mg by mouth daily.   . Calcium-Magnesium-Vitamin D [id number] MG-MG-UNIT TB24 Take 1 tablet by mouth  daily.   . diclofenac sodium (VOLTAREN) 1 % GEL Apply 2 g topically 4 (four) times daily. (Patient not taking: Reported on 11/03/2019) 11/03/2019: Patient feels it does not work  . tiotropium (SPIRIVA HANDIHALER) 18 MCG inhalation capsule Place 1 capsule (18 mcg total) into inhaler and inhale daily.   . [DISCONTINUED] escitalopram (LEXAPRO) 10 MG tablet Take 1 tablet (10 mg total) by mouth daily. (Patient not taking: Reported on 11/03/2019) 11/03/2019: Patient developed severe headache and does not want to take anymore  .  [DISCONTINUED] Revefenacin (YUPELRI) 175 MCG/3ML SOLN Inhale 1 puff into the lungs daily.    No facility-administered encounter medications on file as of 11/03/2019.   SDOH Screenings   Alcohol Screen: Low Risk   . Last Alcohol Screening Score (AUDIT): 0  Depression (PHQ2-9): Medium Risk  . PHQ-2 Score: 16  Financial Resource Strain: High Risk  . Difficulty of Paying Living Expenses: Very hard  Food Insecurity:   . Worried About Charity fundraiser in the Last Year:   . [place] in the Last Year:   Housing:   . Last Housing Risk Score:   Physical Activity:   . Days of Exercise per Week:   . Minutes of Exercise per Session:   Social Connections:   . Frequency of Communication with Friends and Family:   . Frequency of Social Gatherings with Friends and Family:   . Attends Religious Services:   . Active Member of Clubs or Organizations:   . Attends Archivist Meetings:   Marland Kitchen Marital Status:   Stress:   . Feeling of Stress :   Tobacco Use: High Risk  . Smoking Tobacco Use: Current Every Mammie Meras Smoker  . Smokeless Tobacco Use: Never Used  Transportation Needs:   . Film/video editor (Medical):   Marland Kitchen Lack of Transportation (Non-Medical):      Current Diagnosis/Assessment:  Goals Addressed            This Visit's Progress   . Pharmacy Care Plan       CARE PLAN ENTRY  Current Barriers:  . Chronic Disease Management support, education, and care coordination needs related to COPD/Tobacco Use Disorder, Cirrhosis, Hep C, Anxiety  COPD/Tobacco Use Disorder . Pharmacist Clinical Goal(s) o Over the next 90 days, patient will work with PharmD and providers to create an affordable treatment plan for his inhaler regimen o Over the next 90 days, patient will decrease/eliminate his use of cigarettes . Current regimen:  o Albuterol as needed, atrovent 2 puffs 4 times daily . Interventions: o Completed Low Income Adult nurse for patient  o Completed [employer]  patient assistance application (need patient to complete signature for his portions) for Spiriva respimat o Smoke 1-2 less cigarettes per Cendy Oconnor o Consider using ipratropium nebulizing solution 0.5mg  Q6-8H  for nebulizer machine  o Discuss benzonatate refill with Dr. Larose Kells per patient request . Patient self care activities - Over the next 90 days, patient will: o Remain adherent to inhaler regimen o Decrease cigarette smoking by at least 1-2 cigarettes per Kristofor Michalowski  Anxiety . Pharmacist Clinical Goal(s) o Over the next 90 days, patient will work with PharmD and providers to reduce symptoms/limitations of anxiety . Current regimen:  o Hydroxyzine 10mg  TID prn . Interventions: o Offered behavioral health support. Patient would like to wait until he has less going on . Patient self care activities - Over the next 90 days, patient will: o Continue trial of hydroxyzine for anxiety  Medication  management . Pharmacist Clinical Goal(s): o Over the next 90 days, patient will work with PharmD and providers to maintain optimal medication adherence . Current pharmacy[facility] . Interventions o Comprehensive medication review performed. o Continue current medication management strategy . Patient self care activities - Over the next 90 days, patient will: o Focus on medication adherence by filling medications appropriately o Take medications as prescribed o Report any questions or concerns to PharmD and/or provider(s)  Initial goal documentation       Social Hx: Med times: 7am, 10am, 2pm (stomach pill and vitamins), HS Has sons who wanted to take him to the beach for his birthday, but he was not up for it due to medication burden  COPD /Tobacco   Last spirometry score: None noted   Gold Grade: Unable to assess Current COPD Classification:  B (high sx, <2 exacerbations/yr)  Eosinophil count:   Lab Results  Component Value Date/Time   EOSPCT 1.4 02/05/2019 02:30 PM  %                                Eos (Absolute):  Lab Results  Component Value Date/Time   EOSABS 0.1 02/05/2019 02:30 PM    Tobacco Status:  Social History   Tobacco Use  Smoking Status Current Every Ziere Docken Smoker  . Packs/Chloe Bluett: 0.50  . Years: 25.00  . Pack years: 12.50  . Types: Cigarettes  Smokeless Tobacco Never Used  Tobacco Comment   3 PPD, started to decrease to 1/2 ppd 2019    Patient has failed these meds in past: Yupelri (cost) Patient is currently uncontrolled on the following medications: albuterol as needed, atrovent 2 puffs 4 times daily Using maintenance inhaler regularly? No (Does not have a maintenance inhaler) Frequency of rescue inhaler use:  multiple times per Deshay Blumenfeld   Inhalers With insurance his cost was $148 for Lajoyce Lauber cost the patient over $200 after insurance. He really wanted a medication he could use in his new nebulizer machine.   Tobacco 1/2 PPD Does not smoke within first 30 minutes of waking Wakes up, drinks coffee First cigarette occurs in the afternoon.  Has tried tapering off Has tried patches before. Doesn't know if he gave it a full try.  "I would love to quit smoking"  Cough Patient struggles with a chronic cough. He states he hates to feel the rattling in his chest. We discussed mucinex, but he states he was cautious to take any other meds while on Hep C treatment. He requests refill of benzonatate capsules. He states these help him not to wake up in the middle of the night with coughing fits.   We discussed:  smoking cessation  Plan -Consider using ipratropium nebulizing solution 0.5mg  Q6-8H  for nebulizer machine  -Try to smoke 1-2 less cigarettes each Fabricio Endsley (#8 vs #10 daily) -Completed LIS application with patient during visit -Completed BiCares PAP for Spiriva (obtained provider signature, awaiting pt signature) -Patient to pick up copy of LIS application from front office and also to complete appropriate section of BiCares Patient assistance program  application -Discuss benzonatate refills with Dr. Larose Kells per patient request  Cirrhosis/Alcohol Use Disorder    Patient has failed these meds in past: nadolol (fatigue) Patient is currently controlled on the following medications: furosemide 20mg  daily, potassium 24mEq, omeprazole 20mg  daily, propranolol 20mg  twice daily, spironolactone 50mg  daily, topiramate 25mg   No longer drinking. He did this without the assistance  of rehab or AA.  Drinks non-alcoholic beers. Budweiser zero  We discussed:  Encouragement of alcohol cessation  Plan -Continue current medications   Hep C    Patient has failed these meds in past: None noted  Patient is currently controlled on the following medications: Epclusa daily x 12 weeks.   Epclusa: Lots of fatigue. He has to drag himself out of bed. Headaches.   Has 2 more pills he has to take and then he will complete his treatment.  Took Hep C course for 3 months  We discussed:  Encouragement on completing Hep C treatment  Plan -Continue current medications   Anxiety    Patient has failed these meds in past: None noted  Patient is currently uncontrolled on the following medications: hydroxyzine 10mg  TID prn  Escitalopram Got bad headache with escitalopram.  He did not like how it made him feel. He didn't feel like it helped his anxiety. He took it for a few days. He is adamant about not taking this.   Hydroxyzine Feels like it helps some Takes it as needed. Does not take every Lynden Flemmer because he does not have anxiety daily.  He feels like one minute he is on top of the world. The next minute he doesn't know what to do with himself. "Like my nerves are going to shoot out of my head" He felt like valium helped level him out. His prescriber of valium is no longer in the area (free psychiatrist). He was taking about 3 per Kingston Guiles. "Either I'm extremely hyper or I can't get out of bed" "I'm overwhelmed with prescriptions right now"  He gets overwhelmed  even when going shopping  We discussed:  Offered behavioral health support, but patient feels like he can't take on anything else right now  Plan -Continue current medications   Future Plan -Get patient looped into behavioral health or with psychiatrist to help further assess his anxiety or any other underlying mental health condition    Miscellaneous Meds Aspirin 81mg  Benzonatate 100mg  (he fills like it keeps him from waking up in the middle of the night) Diclofenac gel Multivitamin Vitamin C 250mg  Magnesium 250mg  Acetaminophen 500mg  at 2pm daily (shoulder pain)  Meds to D/C from list Yupelri (cost) Escitalopram (side effects, pt does not want to use)

## 2019-11-03 NOTE — Patient Instructions (Signed)
Visit Information  Goals Addressed            This Visit's Progress   . Pharmacy Care Plan       CARE PLAN ENTRY  Current Barriers:  . Chronic Disease Management support, education, and care coordination needs related to COPD/Tobacco Use Disorder, Cirrhosis, Hep C, Anxiety  COPD/Tobacco Use Disorder . Pharmacist Clinical Goal(s) o Over the next 90 days, patient will work with PharmD and providers to create an affordable treatment plan for his inhaler regimen o Over the next 90 days, patient will decrease/eliminate his use of cigarettes . Current regimen:  o Albuterol as needed, atrovent 2 puffs 4 times daily . Interventions: o Completed Low Income Adult nurse for patient  o Completed BiCares patient assistance application (need patient to complete signature for his portions) for Spiriva respimat o Smoke 1-2 less cigarettes per William Whitaker o Consider using ipratropium nebulizing solution 0.5mg  Q6-8H  for nebulizer machine  o Discuss benzonatate refill with Dr. Larose Kells per patient request . Patient self care activities - Over the next 90 days, patient will: o Remain adherent to inhaler regimen o Decrease cigarette smoking by at least 1-2 cigarettes per William Whitaker  Anxiety . Pharmacist Clinical Goal(s) o Over the next 90 days, patient will work with PharmD and providers to reduce symptoms/limitations of anxiety . Current regimen:  o Hydroxyzine 10mg  TID prn . Interventions: o Offered behavioral health support. Patient would like to wait until he has less going on . Patient self care activities - Over the next 90 days, patient will: o Continue trial of hydroxyzine for anxiety  Medication management . Pharmacist Clinical Goal(s): o Over the next 90 days, patient will work with PharmD and providers to maintain optimal medication adherence . [facility] . Interventions o Comprehensive medication review performed. o Continue current medication management  strategy . Patient self care activities - Over the next 90 days, patient will: o Focus on medication adherence by filling medications appropriately o Take medications as prescribed o Report any questions or concerns to PharmD and/or provider(s)  Initial goal documentation        William Whitaker was given information about Chronic Care Management services today including:  1. CCM service includes personalized support from designated clinical staff supervised by his physician, including individualized plan of care and coordination with other care providers 2. 24/7 contact phone numbers for assistance for urgent and routine care needs. 3. Standard insurance, coinsurance, copays and deductibles apply for chronic care management only during months in which we provide at least 20 minutes of these services. Most insurances cover these services at 100%, however patients may be responsible for any copay, coinsurance and/or deductible if applicable. This service may help you avoid the need for more expensive face-to-face services. 4. Only one practitioner may furnish and bill the service in a calendar month. 5. The patient may stop CCM services at any time (effective at the end of the month) by phone call to the office staff.  Patient agreed to services and verbal consent obtained.   The patient verbalized understanding of instructions provided today and agreed to receive a mailed copy of patient instruction and/or educational materials. Telephone follow up appointment with pharmacy team member scheduled for: 11/21/2019  Melvenia Beam Eyad Rochford, PharmD Clinical Pharmacist [facility] [phone]    Steps to Quit Smoking Smoking tobacco is the leading cause of preventable death. It can affect almost every organ in the body. Smoking puts you and  people around you at risk for many serious, long-lasting (chronic) diseases. Quitting smoking can be hard, but it is one of the best things  that you can do for your health. It is never too late to quit. How do I get ready to quit? When you decide to quit smoking, make a plan to help you succeed. Before you quit:  Pick a date to quit. Set a date within the next 2 weeks to give you time to prepare.  Write down the reasons why you are quitting. Keep this list in places where you will see it often.  Tell your family, friends, and co-workers that you are quitting. Their support is important.  Talk with your doctor about the choices that may help you quit.  Find out if your health insurance will pay for these treatments.  Know the people, places, things, and activities that make you want to smoke (triggers). Avoid them. What first steps can I take to quit smoking?  Throw away all cigarettes at home, at work, and in your car.  Throw away the things that you use when you smoke, such as ashtrays and lighters.  Clean your car. Make sure to empty the ashtray.  Clean your home, including curtains and carpets. What can I do to help me quit smoking? Talk with your doctor about taking medicines and seeing a counselor at the same time. You are more likely to succeed when you do both.  If you are pregnant or breastfeeding, talk with your doctor about counseling or other ways to quit smoking. Do not take medicine to help you quit smoking unless your doctor tells you to do so. To quit smoking: Quit right away  Quit smoking totally, instead of slowly cutting back on how much you smoke over a period of time.  Go to counseling. You are more likely to quit if you go to counseling sessions regularly. Take medicine You may take medicines to help you quit. Some medicines need a prescription, and some you can buy over-the-counter. Some medicines may contain a drug called nicotine to replace the nicotine in cigarettes. Medicines may:  Help you to stop having the desire to smoke (cravings).  Help to stop the problems that come when you stop  smoking (withdrawal symptoms). Your doctor may ask you to use:  Nicotine patches, gum, or lozenges.  Nicotine inhalers or sprays.  Non-nicotine medicine that is taken by mouth. Find resources Find resources and other ways to help you quit smoking and remain smoke-free after you quit. These resources are most helpful when you use them often. They include:  Online chats with a Social worker.  Phone quitlines.  Printed Furniture conservator/restorer.  Support groups or group counseling.  Text messaging programs.  Mobile phone apps. Use apps on your mobile phone or tablet that can help you stick to your quit plan. There are many free apps for mobile phones and tablets as well as websites. Examples include Quit Guide from the [employer] and smokefree.gov  What things can I do to make it easier to quit?   Talk to your family and friends. Ask them to support and encourage you.  Call a phone quitline (1-800-QUIT-NOW), reach out to support groups, or work with a Social worker.  Ask people who smoke to not smoke around you.  Avoid places that make you want to smoke, such as: ? Bars. ? Parties. ? Smoke-break areas at work.  Spend time with people who do not smoke.  Lower the stress in  your life. Stress can make you want to smoke. Try these things to help your stress: ? Getting regular exercise. ? Doing deep-breathing exercises. ? Doing yoga. ? Meditating. ? Doing a body scan. To do this, close your eyes, focus on one area of your body at a time from head to toe. Notice which parts of your body are tense. Try to relax the muscles in those areas. How will I feel when I quit smoking? Aundre Hietala 1 to 3 weeks Within the first 24 hours, you may start to have some problems that come from quitting tobacco. These problems are very bad 2-3 days after you quit, but they do not often last for more than 2-3 weeks. You may get these symptoms:  Mood swings.  Feeling restless, nervous, angry, or annoyed.  Trouble  concentrating.  Dizziness.  Strong desire for high-sugar foods and nicotine.  Weight gain.  Trouble pooping (constipation).  Feeling like you may vomit (nausea).  Coughing or a sore throat.  Changes in how the medicines that you take for other issues work in your body.  Depression.  Trouble sleeping (insomnia). Week 3 and afterward After the first 2-3 weeks of quitting, you may start to notice more positive results, such as:  Better sense of smell and taste.  Less coughing and sore throat.  Slower heart rate.  Lower blood pressure.  Clearer skin.  Better breathing.  Fewer sick days. Quitting smoking can be hard. Do not give up if you fail the first time. Some people need to try a few times before they succeed. Do your best to stick to your quit plan, and talk with your doctor if you have any questions or concerns. Summary  Smoking tobacco is the leading cause of preventable death. Quitting smoking can be hard, but it is one of the best things that you can do for your health.  When you decide to quit smoking, make a plan to help you succeed.  Quit smoking right away, not slowly over a period of time.  When you start quitting, seek help from your doctor, family, or friends. This information is not intended to replace advice given to you by your health care provider. Make sure you discuss any questions you have with your health care provider. Document Revised: 03/14/2019 Document Reviewed: 09/07/2018 Elsevier Patient Education  [place].

## 2019-11-05 ENCOUNTER — Other Ambulatory Visit: Payer: Self-pay

## 2019-11-05 MED ORDER — IPRATROPIUM BROMIDE 0.02 % IN SOLN: 0.5000 mg | Freq: Four times a day (QID) | RESPIRATORY_TRACT | 12 refills | Status: DC

## 2019-11-06 ENCOUNTER — Other Ambulatory Visit: Payer: Self-pay | Admitting: Internal Medicine

## 2019-11-06 ENCOUNTER — Telehealth: Payer: Self-pay

## 2019-11-06 NOTE — Telephone Encounter (Signed)
Patient called in to get a refill for hydrOXYzine (ATARAX/VISTARIL) 10 MG tablet [id number]    [id number] [place] [place] - [address] AT [place]  [place], [place] [address]  Phone:  [phone] Fax:  [phone]  DEA #:  [id number]

## 2019-11-06 NOTE — Telephone Encounter (Signed)
Rx sent 

## 2019-11-13 ENCOUNTER — Other Ambulatory Visit: Payer: Self-pay | Admitting: Internal Medicine

## 2019-11-21 ENCOUNTER — Telehealth: Payer: Medicare Other

## 2019-12-24 ENCOUNTER — Other Ambulatory Visit: Payer: Self-pay | Admitting: Internal Medicine

## 2019-12-31 ENCOUNTER — Ambulatory Visit: Payer: Self-pay | Admitting: Pharmacist

## 2019-12-31 NOTE — Chronic Care Management (AMB) (Signed)
Patient was scheduled for a followup CCM Visit on 11/21/2019 to discuss completion of his Spiriva Patient Assistance Program application. Pt admits he did not come to the office to pick up his portion of the application.   He states he will try to come by the office tomorrow to pick it up.  His signature on the paperwork is the only outstanding task before application can be sent in. Expressed this to patient and he verbalized understanding.   He states his breathing is ok, but that it is hard with the heat. Reinforced that Spiriva might be helpful for him and so we should move forward with his PAP application.   Scheduled patient for follow up visit on 01/19/20 to continue coordination with Spiriva PAP application.

## 2020-01-15 ENCOUNTER — Telehealth: Payer: Medicare Other

## 2020-01-19 ENCOUNTER — Telehealth: Payer: Medicare Other

## 2020-01-26 ENCOUNTER — Other Ambulatory Visit: Payer: Self-pay | Admitting: Internal Medicine

## 2020-01-27 ENCOUNTER — Other Ambulatory Visit: Payer: Self-pay | Admitting: Internal Medicine

## 2020-02-02 ENCOUNTER — Telehealth: Payer: Self-pay | Admitting: Internal Medicine

## 2020-02-02 NOTE — Telephone Encounter (Signed)
Patient states his liver doctor will no longer prescribe him omeprazole according to patient care was transfer back to pcp.  Patients needs a refill on medication   omeprazole (PRILOSEC) 20 MG capsule [[id number]]  [id number] [place], [place] - [address] AT [place], [place] [address]  Phone:  [phone] Fax:  [phone]

## 2020-02-02 NOTE — Telephone Encounter (Signed)
Pt is overdue for visit w/ Dr. Larose Kells- please schedule at his earliest convenience.

## 2020-02-03 NOTE — Telephone Encounter (Signed)
Spoke with patient to schedule appointment, patient states he had two family members that just passed and he will give [place] a call back at a later time.

## 2020-02-04 DIAGNOSIS — Z8679 Personal history of other diseases of the circulatory system: Secondary | ICD-10-CM | POA: Diagnosis not present

## 2020-02-04 DIAGNOSIS — S2231XA Fracture of one rib, right side, initial encounter for closed fracture: Secondary | ICD-10-CM | POA: Diagnosis not present

## 2020-02-04 DIAGNOSIS — R5383 Other fatigue: Secondary | ICD-10-CM | POA: Diagnosis not present

## 2020-02-04 DIAGNOSIS — R0602 Shortness of breath: Secondary | ICD-10-CM | POA: Diagnosis not present

## 2020-02-04 DIAGNOSIS — R911 Solitary pulmonary nodule: Secondary | ICD-10-CM | POA: Diagnosis not present

## 2020-02-04 DIAGNOSIS — Z20822 Contact with and (suspected) exposure to covid-19: Secondary | ICD-10-CM | POA: Diagnosis not present

## 2020-02-04 DIAGNOSIS — D509 Iron deficiency anemia, unspecified: Secondary | ICD-10-CM | POA: Diagnosis not present

## 2020-02-05 ENCOUNTER — Telehealth: Payer: Self-pay | Admitting: Internal Medicine

## 2020-02-05 MED ORDER — OMEPRAZOLE 20 MG PO CPDR: 20.0000 mg | DELAYED_RELEASE_CAPSULE | Freq: Every day | ORAL | 0 refills | Status: DC

## 2020-02-05 NOTE — Telephone Encounter (Signed)
15 day supply sent.  

## 2020-02-05 NOTE — Telephone Encounter (Signed)
Medication: omeprazole (PRILOSEC) 20 MG capsule [[id number]]       Has the patient contacted their pharmacy?  (If no, request that the patient contact the pharmacy for the refill.) (If yes, when and what did the pharmacy advise?)     Preferred Pharmacy (with phone number or street name): [id number] - [place], [address], [place] [address]  Phone:  [phone] Fax:  [phone]      Agent: Please be advised that RX refills may take up to 3 business days. We ask that you follow-up with your pharmacy.

## 2020-02-06 ENCOUNTER — Other Ambulatory Visit: Payer: Self-pay | Admitting: Internal Medicine

## 2020-02-08 ENCOUNTER — Other Ambulatory Visit: Payer: Self-pay | Admitting: Internal Medicine

## 2020-02-10 ENCOUNTER — Ambulatory Visit (INDEPENDENT_AMBULATORY_CARE_PROVIDER_SITE_OTHER): Payer: Medicare Other | Admitting: Internal Medicine

## 2020-02-10 ENCOUNTER — Other Ambulatory Visit: Payer: Self-pay

## 2020-02-10 VITALS — BP 140/77 | HR 78 | Temp 98.1°F | Resp 16 | Ht 72.0 in | Wt 190.0 lb

## 2020-02-10 DIAGNOSIS — D509 Iron deficiency anemia, unspecified: Secondary | ICD-10-CM

## 2020-02-10 DIAGNOSIS — K739 Chronic hepatitis, unspecified: Secondary | ICD-10-CM | POA: Diagnosis not present

## 2020-02-10 LAB — RETICULOCYTES
ABS Retic: 88410 cells/uL (ref 25000–9000)
Retic Ct Pct: 2.1 %

## 2020-02-10 NOTE — Progress Notes (Signed)
Subjective:    Patient ID: William Whitaker, male    DOB: [birth year], 59 y.o.   MRN: [id number]  DOS:  02/10/2020 Type of visit - description: ER follow-up  Patient went to the ER a 10/2019: Prior to the ER visit, he had blood work by the hepatologist, he was told his liver tests were okay however he had severe anemia with a hemoglobin of 6.5. At the time he was feeling extremely tired, looked pale.  + Shortness of breath.  Work-up at the emergency room: Chest x-ray negative. Transferring 430 elevated, iron 20, low.  TIBC 602 elevated. White count 3.8, hemoglobin 6.5, platelets 150. CMP essentially normal except for a sodium 134. Covid negative Hemoccult negative  At the ER, he got 1 PRBC transfusion but was not admitted.  Here for follow-up He feels much improved in general, less tired, less short of breath   Review of Systems Denies fever chills Appetite is okay Denies lower extremity edema No nausea, vomiting, diarrhea.  No blood in the stools, stools are normal in color  Past Medical History:  Diagnosis Date  . Anxiety   . Bronchitis   . GERD (gastroesophageal reflux disease)   . Insomnia     Past Surgical History:  Procedure Laterality Date  . BIOPSY  01/27/2019   Procedure: BIOPSY;  Surgeon: Irving Copas., MD;  Location: [facility];  Service: Gastroenterology;;  . COLONOSCOPY WITH PROPOFOL N/A 01/27/2019   Procedure: COLONOSCOPY WITH PROPOFOL;  Surgeon: Irving Copas., MD;  Location: [facility];  Service: Gastroenterology;  Laterality: N/A;  . ESOPHAGOGASTRODUODENOSCOPY (EGD) WITH PROPOFOL N/A 01/27/2019   Procedure: ESOPHAGOGASTRODUODENOSCOPY (EGD) WITH PROPOFOL;  Surgeon: Rush Landmark Telford Nab., MD;  Location: [facility];  Service: Gastroenterology;  Laterality: N/A;  . HERNIA REPAIR Right remotely as a baby  . POLYPECTOMY  01/27/2019   Procedure: POLYPECTOMY;  Surgeon: Mansouraty, Telford Nab., MD;  Location: [facility];  Service:  Gastroenterology;;    Allergies as of 02/10/2020   No Known Allergies     Medication List       Accurate as of February 10, 2020  1:52 PM. If you have any questions, ask your nurse or doctor.        STOP taking these medications   benzonatate 100 MG capsule Commonly known as: TESSALON Stopped by: Kathlene November, MD   Calcium-Magnesium-Vitamin D [phone] MG-MG-UNIT Tb24 Stopped by: Kathlene November, MD   diclofenac sodium 1 % Gel Commonly known as: Voltaren Stopped by: Kathlene November, MD     TAKE these medications   albuterol 108 (90 Base) MCG/ACT inhaler Commonly known as: VENTOLIN HFA Inhale 2 puffs into the lungs every 6 (six) hours as needed for wheezing or shortness of breath.   aspirin EC 81 MG tablet Take 81 mg by mouth daily.   furosemide 20 MG tablet Commonly known as: LASIX Take 1 tablet (20 mg total) by mouth daily.   hydrOXYzine 10 MG tablet Commonly known as: ATARAX/VISTARIL Take 1 tablet (10 mg total) by mouth 3 (three) times daily as needed.   ipratropium 0.02 % nebulizer solution Commonly known as: ATROVENT Take 2.5 mLs (0.5 mg total) by nebulization 4 (four) times daily.   ipratropium 17 MCG/ACT inhaler Commonly known as: ATROVENT HFA Inhale 2 puffs into the lungs 4 (four) times daily.   omeprazole 20 MG capsule Commonly known as: PRILOSEC Take 1 capsule (20 mg total) by mouth daily.   One-A-Day Mens VitaCraves Chew Chew 1 tablet by mouth daily.  potassium chloride SA 20 MEQ tablet Commonly known as: Klor-Con M20 Take 1 tablet (20 mEq total) by mouth daily.   propranolol 20 MG tablet Commonly known as: INDERAL Take 1 tablet (20 mg total) by mouth 2 (two) times daily.   Spiriva HandiHaler 18 MCG inhalation capsule Generic drug: tiotropium Place 1 capsule (18 mcg total) into inhaler and inhale daily.   spironolactone 50 MG tablet Commonly known as: ALDACTONE Take 1 tablet (50 mg total) by mouth daily.   topiramate 25 MG tablet Commonly known as:  TOPAMAX TAKE 1 TABLET BY MOUTH EVERY DAY   vitamin C 250 MG tablet Commonly known as: ASCORBIC ACID Take 250 mg by mouth daily.          Objective:   Physical Exam Abdominal:      BP 140/77 (BP Location: Left Arm, Patient Position: Sitting, Cuff Size: Small)   Pulse 78   Temp 98.1 F (36.7 C) (Oral)   Resp 16   Ht 6' (1.829 m)   Wt 190 lb (86.2 kg)   SpO2 98%   BMI 25.77 kg/m  General:   Well developed, NAD, BMI noted.  HEENT:  Normocephalic . Face symmetric, atraumatic.  Looks pale Lungs:  Decreased breath sounds Normal respiratory effort, no intercostal retractions, no accessory muscle use. Heart: RRR,  no murmur.  Abdomen:  Not distended, soft, see graphic Skin: Not pale. Not jaundice Lower extremities: no pretibial edema bilaterally  Neurologic:  alert & oriented X3.  Speech normal, gait appropriate for age and unassisted Psych--  Cognition and judgment appear intact.  Cooperative with normal attention span and concentration.  Behavior appropriate. No anxious or depressed appearing.     Assessment      ASSESSMENT (referred by [name])  COPD Anxiety GERD H/o ETOH and Heavy tobacco use (3ppd)  Hep C cirrhosis  PLAN Anemia: Severe, microcytic, hypochromic. TIBC, iron percentage sat consistent with iron deficiency.  Transferrin elevated. Most likely it has developed gradually given that he was tolerated severe anemia relatively well. Plan: Recheck CBC, reticulocyte count.  Refer back to GI.  He might need hematology consult for IV iron supplement as well. Stop aspirin Hep C with cirrhosis: LFTs at the ER normal. Prior to the ER visit, saw dawn Drazek NP his liver specialist , was told his liver was doing well. Hepatomegaly on exam?.  Patient tells me he has ultrasound of the abdomen is scheduled within few days at [facility] Rx by Drazekic NP EtOH: "Cannot recall the last time I drink". Tobacco abuse: Still smoking. RTC 3  months    This visit occurred during the SARS-CoV-2 public health emergency.  Safety protocols were in place, including screening questions prior to the visit, additional usage of staff PPE, and extensive cleaning of exam room while observing appropriate contact time as indicated for disinfecting solutions.

## 2020-02-10 NOTE — Patient Instructions (Signed)
We are referring back to the gastroenterologist  Continue taking iron by mouth on an empty stomach.  1 multivitamin every day  GO TO THE LAB : Get the blood work     [place], [place] back for  a checkup in 3 months

## 2020-02-11 ENCOUNTER — Telehealth: Payer: Self-pay

## 2020-02-11 ENCOUNTER — Telehealth: Payer: Self-pay | Admitting: Emergency Medicine

## 2020-02-11 ENCOUNTER — Other Ambulatory Visit: Payer: Self-pay | Admitting: Gastroenterology

## 2020-02-11 DIAGNOSIS — D509 Iron deficiency anemia, unspecified: Secondary | ICD-10-CM

## 2020-02-11 LAB — CBC WITH DIFFERENTIAL/PLATELET
Basophils Absolute: 0 10*3/uL (ref 0.0–0.1)
Basophils Relative: 1 % (ref 0.0–3.0)
Eosinophils Absolute: 0.1 10*3/uL (ref 0.0–0.7)
Eosinophils Relative: 2.7 % (ref 0.0–5.0)
HCT: 26.1 % — ABNORMAL LOW (ref 39.0–52.0)
Hemoglobin: 7.5 g/dL — CL (ref 13.0–17.0)
Lymphocytes Relative: 38.3 % (ref 12.0–46.0)
Lymphs Abs: 1.9 10*3/uL (ref 0.7–4.0)
MCHC: 28.9 g/dL — ABNORMAL LOW (ref 30.0–36.0)
MCV: 61.9 fl — ABNORMAL LOW (ref 78.0–100.0)
Monocytes Absolute: 0.6 10*3/uL (ref 0.1–1.0)
Monocytes Relative: 13 % — ABNORMAL HIGH (ref 3.0–12.0)
Neutro Abs: 2.2 10*3/uL (ref 1.4–7.7)
Neutrophils Relative %: 45 % (ref 43.0–77.0)
Platelets: 151 10*3/uL (ref 150.0–400.0)
RBC: 4.21 Mil/uL — ABNORMAL LOW (ref 4.22–5.81)
RDW: 23.7 % — ABNORMAL HIGH (ref 11.5–15.5)
WBC: 4.9 10*3/uL (ref 4.0–10.5)

## 2020-02-11 NOTE — Telephone Encounter (Signed)
Noted, thank you, improving.

## 2020-02-11 NOTE — Assessment & Plan Note (Signed)
Anemia: Severe, microcytic, hypochromic. TIBC, iron percentage sat consistent with iron deficiency.  Transferrin elevated. Most likely it has developed gradually given that he was tolerated severe anemia relatively well. Plan: Recheck CBC, reticulocyte count.  Refer back to GI.  He might need hematology consult for IV iron supplement as well. Hep C with cirrhosis: LFTs at the ER normal. Prior to the ER visit, saw dawn Drazek NP his liver specialist , was told his liver was doing well. Hepatomegaly on exam?.  Patient tells me he has ultrasound of the abdomen is scheduled within few days at [facility] Rx by Drazekic NP EtOH: "Cannot recall the last time I drink". Tobacco abuse: Still smoking. RTC 3 months

## 2020-02-11 NOTE — Telephone Encounter (Signed)
-----   Message from [place], DO sent at 02/11/2020 12:49 PM EDT ----- Regarding: RE: anemia JP, Thanks for bringing this to my attention. Completely agree that he is likely iron deficient based on that MCV and RDW. Will get him back in.  [place], Please add on iron panel, ferritin to his labs and will likely set up for IV iron pending results. Also please get appt with me for eval and likely repeat EGD (history of esophageal varices and portal hypertensive gastropathy). Should also be following Q6 months in the [facility], but I dont see any recent notes. Please confirm with patient. Thanks.  VC ----- Message ----- From: Colon Branch, MD Sent: 02/11/2020  10:09 AM EDT To: Lavena Bullion, DO Subject: anemia                                         Dr. Bryan Lemma, he is quite anemic again, I am referring him back to you.I also wondered if he might need to see hematology for IV iron.JP

## 2020-02-11 NOTE — Progress Notes (Unsigned)
ferr

## 2020-02-11 NOTE — Telephone Encounter (Signed)
Spoke to patient to let him know Dr Vivia Ewing recommendations. Patient will come to the [facility] office for labs on Friday,and follow up in office next month. Patient had labs done last week with [facility], and has a follow up with [facility] next month.

## 2020-02-11 NOTE — Telephone Encounter (Signed)
"  CRITICAL VALUE STICKER  CRITICAL VALUE:HGB 7.5  RECEIVER (on-site recipient of call):William Martello P.  DATE & TIME NOTIFIED: 02-11-2020  MESSENGER (representative from lab):[facility]  MD NOTIFIED: PAZ  TIME OF NOTIFICATION:1044  RESPONSE: REPORTED TO SKEKETIA/CMA

## 2020-02-15 ENCOUNTER — Other Ambulatory Visit: Payer: Self-pay | Admitting: Internal Medicine

## 2020-02-18 ENCOUNTER — Telehealth: Payer: Self-pay | Admitting: Gastroenterology

## 2020-02-18 ENCOUNTER — Other Ambulatory Visit: Payer: Self-pay | Admitting: Internal Medicine

## 2020-02-18 DIAGNOSIS — D509 Iron deficiency anemia, unspecified: Secondary | ICD-10-CM | POA: Diagnosis not present

## 2020-02-18 NOTE — Telephone Encounter (Signed)
Spoke to patient to let him know that he can come by the [facility] office this afternoon for lab work. He does not need an appointment. Patient voiced understanding.

## 2020-02-19 LAB — IRON,TIBC AND FERRITIN PANEL
Ferritin: 15 ng/mL — ABNORMAL LOW (ref 30–400)
Iron Saturation: 4 % — CL (ref 15–55)
Iron: 23 ug/dL — ABNORMAL LOW (ref 38–169)
Total Iron Binding Capacity: 512 ug/dL — ABNORMAL HIGH (ref 250–450)
UIBC: 489 ug/dL — ABNORMAL HIGH (ref 111–343)

## 2020-02-20 ENCOUNTER — Other Ambulatory Visit: Payer: Self-pay | Admitting: Gastroenterology

## 2020-02-20 DIAGNOSIS — D509 Iron deficiency anemia, unspecified: Secondary | ICD-10-CM

## 2020-02-23 ENCOUNTER — Telehealth: Payer: Self-pay | Admitting: Gastroenterology

## 2020-02-23 NOTE — Telephone Encounter (Signed)
Pt is requesting a call back from a nurse to discuss his low iron levels.

## 2020-02-23 NOTE — Telephone Encounter (Signed)
Spoke to patient to discuss his iron levels. Patient has an appointment scheduled for his first infusion at Dr Antonieta Pert office. He will continue taking his iron supplements and maintain an iron rich diet. All questions answered. Patient voiced understanding.

## 2020-03-01 ENCOUNTER — Other Ambulatory Visit: Payer: Self-pay | Admitting: Internal Medicine

## 2020-03-01 MED ORDER — PROPRANOLOL HCL 20 MG PO TABS: 20.0000 mg | ORAL_TABLET | Freq: Two times a day (BID) | ORAL | 1 refills | Status: DC

## 2020-03-03 ENCOUNTER — Other Ambulatory Visit: Payer: Medicare Other

## 2020-03-03 ENCOUNTER — Ambulatory Visit: Payer: Medicare Other | Admitting: Family

## 2020-03-04 ENCOUNTER — Other Ambulatory Visit: Payer: Self-pay | Admitting: Family

## 2020-03-04 DIAGNOSIS — D649 Anemia, unspecified: Secondary | ICD-10-CM

## 2020-03-04 DIAGNOSIS — E538 Deficiency of other specified B group vitamins: Secondary | ICD-10-CM

## 2020-03-05 ENCOUNTER — Inpatient Hospital Stay (HOSPITAL_BASED_OUTPATIENT_CLINIC_OR_DEPARTMENT_OTHER): Payer: Medicare Other | Admitting: Family

## 2020-03-05 ENCOUNTER — Encounter: Payer: Self-pay | Admitting: Family

## 2020-03-05 ENCOUNTER — Inpatient Hospital Stay: Payer: Medicare Other | Attending: Hematology & Oncology

## 2020-03-05 ENCOUNTER — Other Ambulatory Visit: Payer: Self-pay

## 2020-03-05 VITALS — BP 133/90 | HR 75 | Temp 98.7°F | Resp 20 | Ht 72.0 in | Wt 193.8 lb

## 2020-03-05 DIAGNOSIS — K909 Intestinal malabsorption, unspecified: Secondary | ICD-10-CM | POA: Insufficient documentation

## 2020-03-05 DIAGNOSIS — D508 Other iron deficiency anemias: Secondary | ICD-10-CM

## 2020-03-05 DIAGNOSIS — Z79899 Other long term (current) drug therapy: Secondary | ICD-10-CM | POA: Insufficient documentation

## 2020-03-05 DIAGNOSIS — K648 Other hemorrhoids: Secondary | ICD-10-CM | POA: Insufficient documentation

## 2020-03-05 DIAGNOSIS — F17210 Nicotine dependence, cigarettes, uncomplicated: Secondary | ICD-10-CM | POA: Diagnosis not present

## 2020-03-05 DIAGNOSIS — Z8619 Personal history of other infectious and parasitic diseases: Secondary | ICD-10-CM | POA: Insufficient documentation

## 2020-03-05 DIAGNOSIS — K219 Gastro-esophageal reflux disease without esophagitis: Secondary | ICD-10-CM | POA: Diagnosis not present

## 2020-03-05 DIAGNOSIS — K644 Residual hemorrhoidal skin tags: Secondary | ICD-10-CM | POA: Diagnosis not present

## 2020-03-05 DIAGNOSIS — D509 Iron deficiency anemia, unspecified: Secondary | ICD-10-CM | POA: Insufficient documentation

## 2020-03-05 DIAGNOSIS — D649 Anemia, unspecified: Secondary | ICD-10-CM

## 2020-03-05 DIAGNOSIS — J449 Chronic obstructive pulmonary disease, unspecified: Secondary | ICD-10-CM | POA: Insufficient documentation

## 2020-03-05 DIAGNOSIS — I8510 Secondary esophageal varices without bleeding: Secondary | ICD-10-CM | POA: Insufficient documentation

## 2020-03-05 DIAGNOSIS — K7030 Alcoholic cirrhosis of liver without ascites: Secondary | ICD-10-CM | POA: Insufficient documentation

## 2020-03-05 DIAGNOSIS — E538 Deficiency of other specified B group vitamins: Secondary | ICD-10-CM

## 2020-03-05 LAB — CBC WITH DIFFERENTIAL (CANCER CENTER ONLY)
Abs Immature Granulocytes: 0.03 10*3/uL (ref 0.00–0.07)
Basophils Absolute: 0.1 10*3/uL (ref 0.0–0.1)
Basophils Relative: 1 %
Eosinophils Absolute: 0.2 10*3/uL (ref 0.0–0.5)
Eosinophils Relative: 3 %
HCT: 32.4 % — ABNORMAL LOW (ref 39.0–52.0)
Hemoglobin: 9 g/dL — ABNORMAL LOW (ref 13.0–17.0)
Immature Granulocytes: 1 %
Lymphocytes Relative: 29 %
Lymphs Abs: 1.4 10*3/uL (ref 0.7–4.0)
MCH: 20 pg — ABNORMAL LOW (ref 26.0–34.0)
MCHC: 27.8 g/dL — ABNORMAL LOW (ref 30.0–36.0)
MCV: 72.2 fL — ABNORMAL LOW (ref 80.0–100.0)
Monocytes Absolute: 0.6 10*3/uL (ref 0.1–1.0)
Monocytes Relative: 13 %
Neutro Abs: 2.5 10*3/uL (ref 1.7–7.7)
Neutrophils Relative %: 53 %
Platelet Count: 202 10*3/uL (ref 150–400)
RBC: 4.49 MIL/uL (ref 4.22–5.81)
RDW: 25.2 % — ABNORMAL HIGH (ref 11.5–15.5)
WBC Count: 4.7 10*3/uL (ref 4.0–10.5)
nRBC: 0 % (ref 0.0–0.2)

## 2020-03-05 LAB — RETICULOCYTES
Immature Retic Fract: 18.7 % — ABNORMAL HIGH (ref 2.3–15.9)
RBC.: 4.42 MIL/uL (ref 4.22–5.81)
Retic Count, Absolute: 91.5 10*3/uL (ref 19.0–186.0)
Retic Ct Pct: 2.1 % (ref 0.4–3.1)

## 2020-03-05 LAB — CMP (CANCER CENTER ONLY)
ALT: 15 U/L (ref 0–44)
AST: 36 U/L (ref 15–41)
Albumin: 4.1 g/dL (ref 3.5–5.0)
Alkaline Phosphatase: 62 U/L (ref 38–126)
Anion gap: 8 (ref 5–15)
BUN: 11 mg/dL (ref 6–20)
CO2: 24 mmol/L (ref 22–32)
Calcium: 9.2 mg/dL (ref 8.9–10.3)
Chloride: 101 mmol/L (ref 98–111)
Creatinine: 0.77 mg/dL (ref 0.61–1.24)
GFR, Est AFR Am: >60 mL/min (ref >60)
GFR, Estimated: >60 mL/min (ref >60)
Glucose, Bld: 113 mg/dL — ABNORMAL HIGH (ref 70–99)
Potassium: 3.9 mmol/L (ref 3.5–5.1)
Sodium: 133 mmol/L — ABNORMAL LOW (ref 135–145)
Total Bilirubin: 0.8 mg/dL (ref 0.3–1.2)
Total Protein: 7.8 g/dL (ref 6.5–8.1)

## 2020-03-05 LAB — FOLATE: Folate: 36.8 ng/mL (ref >5.9)

## 2020-03-05 LAB — VITAMIN B12: Vitamin B-12: 382 pg/mL (ref 180–914)

## 2020-03-05 LAB — SAMPLE TO BLOOD BANK

## 2020-03-05 LAB — SAVE SMEAR(SSMR), FOR PROVIDER SLIDE REVIEW

## 2020-03-05 NOTE — Progress Notes (Signed)
Hematology/Oncology Consultation   Name: NEWTON FRUTIGER      MRN: [id number]    Location: Room/bed info not found  Date: 03/05/2020 Time:1:15 PM   REFERRING PHYSICIAN: Vito Cirigliano, DO  REASON FOR CONSULT: Iron deficiency anemia    DIAGNOSIS: Iron deficiency anemia  HISTORY OF PRESENT ILLNESS: Mr. Ferns is a pleasant 59 yo gentleman with history of iron deficiency anemia. He has required blood transfusions most recently last month for Hgb of 7.5.  No history of GI bleed. Heme occult testing in ED on 02/04/2020 was negative.  He had both endoscopy and colonoscopy in July 2020. This showed grade 1 and 2 esophageal varices gastritis, Brunner's gland hyperplasia, internal and external hemorrhoids, several benign polyps were removed from the sigmoid colon, transverse colon, ascending colon and cecum.  Iron saturation 2 weeks ago was 4% and ferritin 15.  Hgb today is 9.0 and MCV 72. Platelets are 202, WBC count 4.7.  He has not noted any blood loss. No bruising or petechiae. He is currently taking an oral iron supplement as well as Geritol daily.  He takes Prilosec daily for GERD.  He has occasional episodes of SOB secondary to COPD. He used to smoke 3 ppd but has cut back to 1/2 ppd.  He has history of alcohol induced cirrhosis and states that he has stopped drinking. He drinks nonalcoholic beer now.  He had Hep C and completed treatment with Epclusa earlier this year. He continues to be followed by [facility] with Hepatology.  His sister also has history of anemia.  No personal or familial history of cancer.  No diabetes or thyroid disease.  No fever, chills, n/v, rash, dizziness, chest pain, palpitations, abdominal pain or changes in bowel or bladder habits.  No swelling, tenderness, numbness or tingling in his extremities.  No falls or syncope.  He has maintained a good appetite and is staying well hydrated. Her describes his weight as stable.  He is starting to exercise a little more since it  is not as hot outside.   ROS: All other 10 point review of systems is negative.   PAST MEDICAL HISTORY:   Past Medical History:  Diagnosis Date   Anxiety    Bronchitis    GERD (gastroesophageal reflux disease)    Insomnia     ALLERGIES: No Known Allergies    MEDICATIONS:  Current Outpatient Medications on File Prior to Visit  Medication Sig Dispense Refill   albuterol (VENTOLIN HFA) 108 (90 Base) MCG/ACT inhaler Inhale 2 puffs into the lungs every 6 (six) hours as needed for wheezing or shortness of breath. 18 g 5   aspirin EC 81 MG tablet Take 81 mg by mouth daily.  (Patient not taking: Reported on 02/11/2020)     Cholecalciferol (VITAMIN D3) 50 MCG (2000 UT) TABS Take 1 tablet by mouth daily.     ferrous sulfate 325 (65 FE) MG tablet Take 1 tablet by mouth daily. (Patient not taking: Reported on 02/10/2020)     furosemide (LASIX) 20 MG tablet Take 1 tablet (20 mg total) by mouth daily. 90 tablet 1   hydrOXYzine (ATARAX/VISTARIL) 10 MG tablet Take 1 tablet (10 mg total) by mouth 3 (three) times daily as needed. 30 tablet 3   ipratropium (ATROVENT HFA) 17 MCG/ACT inhaler Inhale 2 puffs into the lungs 4 (four) times daily. 1 Inhaler 5   ipratropium (ATROVENT) 0.02 % nebulizer solution Take 2.5 mLs (0.5 mg total) by nebulization 4 (four) times daily. 75 mL  12   Multiple Vitamins-Minerals (ONE-A-DAY MENS VITACRAVES) CHEW Chew 1 tablet by mouth daily.     omeprazole (PRILOSEC) 20 MG capsule Take 1 capsule (20 mg total) by mouth daily. 90 capsule 3   potassium chloride SA (KLOR-CON M20) 20 MEQ tablet Take 1 tablet (20 mEq total) by mouth daily. 30 tablet 5   propranolol (INDERAL) 20 MG tablet Take 1 tablet (20 mg total) by mouth 2 (two) times daily. 180 tablet 1   spironolactone (ALDACTONE) 50 MG tablet Take 1 tablet (50 mg total) by mouth daily. 90 tablet 1   topiramate (TOPAMAX) 25 MG tablet TAKE 1 TABLET BY MOUTH EVERY DAY 90 tablet 0   vitamin C (ASCORBIC ACID) 250 MG  tablet Take 250 mg by mouth daily.     No current facility-administered medications on file prior to visit.     PAST SURGICAL HISTORY Past Surgical History:  Procedure Laterality Date   BIOPSY  01/27/2019   Procedure: BIOPSY;  Surgeon: Rush Landmark Telford Nab., MD;  Location: [facility];  Service: Gastroenterology;;   COLONOSCOPY WITH PROPOFOL N/A 01/27/2019   Procedure: COLONOSCOPY WITH PROPOFOL;  Surgeon: Irving Copas., MD;  Location: [facility];  Service: Gastroenterology;  Laterality: N/A;   ESOPHAGOGASTRODUODENOSCOPY (EGD) WITH PROPOFOL N/A 01/27/2019   Procedure: ESOPHAGOGASTRODUODENOSCOPY (EGD) WITH PROPOFOL;  Surgeon: Rush Landmark Telford Nab., MD;  Location: [facility];  Service: Gastroenterology;  Laterality: N/A;   HERNIA REPAIR Right remotely as a baby   POLYPECTOMY  01/27/2019   Procedure: POLYPECTOMY;  Surgeon: Mansouraty, Telford Nab., MD;  Location: [facility];  Service: Gastroenterology;;    FAMILY HISTORY: Family History  Problem Relation Age of Onset   Muscular dystrophy Mother    CAD Neg Hx    Diabetes Neg Hx    Cancer Neg Hx     SOCIAL HISTORY:  reports that he has been smoking cigarettes. He has a 12.50 pack-year smoking history. He has never used smokeless tobacco. He reports previous alcohol use of about 60.0 standard drinks of alcohol per week. He reports that he does not use drugs.  PERFORMANCE STATUS: The patient's performance status is 1 - Symptomatic but completely ambulatory  PHYSICAL EXAM: Most Recent Vital Signs: There were no vitals taken for this visit. There were no vitals taken for this visit.  General Appearance:    Alert, cooperative, no distress, appears stated age  Head:    Normocephalic, without obvious abnormality, atraumatic  Eyes:    PERRL, conjunctiva/corneas clear, EOM's intact, fundi    benign, both eyes             Throat:   Lips, mucosa, and tongue normal; teeth and gums normal  Neck:   Supple,  symmetrical, trachea midline, no adenopathy;       thyroid:  No enlargement/tenderness/nodules; no carotid   bruit or JVD  Back:     Symmetric, no curvature, ROM normal, no CVA tenderness  Lungs:     Clear to auscultation bilaterally, respirations unlabored  Chest wall:    No tenderness or deformity  Heart:    Regular rate and rhythm, S1 and S2 normal, no murmur, rub   or gallop  Abdomen:     Soft, non-tender, bowel sounds active all four quadrants,    no masses, no organomegaly        Extremities:   Extremities normal, atraumatic, no cyanosis or edema  Pulses:   2+ and symmetric all extremities  Skin:   Skin color, texture, turgor normal, no rashes  or lesions  Lymph nodes:   Cervical, supraclavicular, and axillary nodes normal  Neurologic:   CNII-XII intact. Normal strength, sensation and reflexes      throughout    LABORATORY DATA:  No results found for this or any previous visit (from the past 48 hour(s)).    RADIOGRAPHY: No results found.     PATHOLOGY: None  ASSESSMENT/PLAN: Mr. Krejci is a pleasant 59 yo gentleman with history of iron deficiency anemia secondary to malabsorption and possibly intermittent GI blood loss.  We will see what his iron studies look like and replace with IV iron if needed. Until then he will continue taking his oral iron supplement.  Erythropoietin level also pending.  We will plan to see him again in 8 weeks.   All questions were answered and he is in agreement with the plan. He can contact our office with any questions or concerns. We can certainly see him sooner if needed.   The patient was discussed with Dr. Marin Olp and he is in agreement with the aforementioned.   [facility]

## 2020-03-08 LAB — ERYTHROPOIETIN: Erythropoietin: 26.5 m[IU]/mL — ABNORMAL HIGH (ref 2.6–18.5)

## 2020-03-09 ENCOUNTER — Other Ambulatory Visit: Payer: Self-pay | Admitting: *Deleted

## 2020-03-09 DIAGNOSIS — D508 Other iron deficiency anemias: Secondary | ICD-10-CM

## 2020-03-09 LAB — LACTATE DEHYDROGENASE: LDH: 124 U/L (ref 98–192)

## 2020-03-09 LAB — IRON AND TIBC
Iron: 209 ug/dL — ABNORMAL HIGH (ref 42–163)
Saturation Ratios: 42 % (ref 20–55)
TIBC: 500 ug/dL — ABNORMAL HIGH (ref 202–409)
UIBC: 291 ug/dL (ref 117–376)

## 2020-03-09 LAB — FERRITIN: Ferritin: 18 ng/mL — ABNORMAL LOW (ref 24–336)

## 2020-03-09 MED ORDER — FERROUS SULFATE 325 (65 FE) MG PO TABS: 325.0000 mg | ORAL_TABLET | Freq: Every day | ORAL | 3 refills | Status: DC

## 2020-03-12 ENCOUNTER — Telehealth: Payer: Self-pay | Admitting: Pharmacist

## 2020-03-12 NOTE — Progress Notes (Addendum)
Chronic Care Management Pharmacy Assistant   Name: William Whitaker  MRN: [id number] DOB: [birth year]  Reason for Encounter: Disease State  Patient Questions:  1.  Have you seen any other providers since your last visit? Yes  2.  Any changes in your medicines or health? Yes  PCP : Colon Branch, MD   Their chronic conditions include: COPD/Tobacco Use Disorder, Cirrhosis, Hep C, Anxiety  12-31-2019 Spiriva PAP with clinical pharmacist.  Patient stated he left the PAP paperwork at Dr. Ethel Rana office at his last visit on 02-10-2020  Office Visit: 02-10-2020 (PCP) ED follow up. Patient reported feeling much better since PRBC transfusion; less tired, less short of breath. Advised to continue iron supplement on an empty stomach. Lab work ordered.  Consults: 03-05-2020: (Oncology) Patient presented in th office of Dr. Vergia Alcon for evaluation for recent blood transfusion. IV iron not needed. Patient was advised to continue taking his oral iron supplements. Will f/u in eight weeks.  ED: 02-04-2020; Patient was referred to the ED by Dr. For low hemoglobin. Patient reported recent lightheadedness, shortness of breath. Chest x-ray done at the Ed was found to be unremarkable. Labs repeated at ED resulted in a hemoglobin of 6.5, mild hyponatremia present. Iron panel confirmed significant iron deficiency. Patient was not willing to be admitted to the hospital for further work-up. He was given 1 unit of blood transfusion, and iron therapy was initialed. Was prescribed Ferrous Sulfate 325 mg tabs to take daily.    Allergies:  No Known Allergies  Medications: Outpatient Encounter Medications as of 03/12/2020  Medication Sig  . albuterol (VENTOLIN HFA) 108 (90 Base) MCG/ACT inhaler Inhale 2 puffs into the lungs every 6 (six) hours as needed for wheezing or shortness of breath.  Marland Kitchen aspirin EC 81 MG tablet Take 81 mg by mouth daily.  (Patient not taking: Reported on 03/05/2020)  . Cholecalciferol (VITAMIN D3) 50 MCG  (2000 UT) TABS Take 1 tablet by mouth daily.  . ferrous sulfate 325 (65 FE) MG tablet Take 1 tablet (325 mg total) by mouth daily with breakfast.  . furosemide (LASIX) 20 MG tablet Take 1 tablet (20 mg total) by mouth daily.  . hydrOXYzine (ATARAX/VISTARIL) 10 MG tablet Take 1 tablet (10 mg total) by mouth 3 (three) times daily as needed.  Marland Kitchen ipratropium (ATROVENT HFA) 17 MCG/ACT inhaler Inhale 2 puffs into the lungs 4 (four) times daily.  Marland Kitchen ipratropium (ATROVENT) 0.02 % nebulizer solution Take 2.5 mLs (0.5 mg total) by nebulization 4 (four) times daily.  . Multiple Vitamins-Minerals (ONE-A-DAY MENS VITACRAVES) CHEW Chew 1 tablet by mouth daily.  Marland Kitchen omeprazole (PRILOSEC) 20 MG capsule Take 1 capsule (20 mg total) by mouth daily.  . potassium chloride SA (KLOR-CON M20) 20 MEQ tablet Take 1 tablet (20 mEq total) by mouth daily.  . propranolol (INDERAL) 20 MG tablet Take 1 tablet (20 mg total) by mouth 2 (two) times daily.  Marland Kitchen spironolactone (ALDACTONE) 50 MG tablet Take 1 tablet (50 mg total) by mouth daily.  Marland Kitchen topiramate (TOPAMAX) 25 MG tablet TAKE 1 TABLET BY MOUTH EVERY DAY  . vitamin C (ASCORBIC ACID) 250 MG tablet Take 250 mg by mouth daily.   No facility-administered encounter medications on file as of 03/12/2020.    Current Diagnosis: Patient Active Problem List   Diagnosis Date Noted  . COPD (chronic obstructive pulmonary disease) ([place]) 04/09/2019  . PCP NOTES >>>>>>>>>>>>>>>>>> 02/05/2019  . Chronic hepatitis ([place]) 02/05/2019  . EtOH dependence ([place]) 01/25/2019  .  Tobacco dependence 01/25/2019  . Alcoholic cirrhosis of liver with ascites ([place]) 01/25/2019  . Hyponatremia 01/25/2019  . Iron deficiency anemia   . Symptomatic anemia 01/24/2019  . ANXIETY 08/20/2007  . 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 G E R D 08/20/2007    Goals Addressed   None    . Current COPD regimen: Albuterol as needed, atrovent 2 puffs 4 times daily . No flowsheet data found. . Any recent  hospitalizations or ED visits since last visit with CPP? Yes as described above . Reports  COPD symptoms, including Symptoms worse at night . Have you had exacerbation/flare-up since last visit? No . What do you do when you are short of breath?  Rescue medication  Respiratory Devices/Equipment . Do you have a nebulizer? Yes Patient reports needing more of the nebulizer solution . Do you use a Peak Flow Meter? No . Do you use a maintenance inhaler? No . How often do you forget to use your daily inhaler? Does not have a maintenance inhaler at this time, only using the rescue.  . Do you use a rescue inhaler? Yes . How often do you use your rescue inhaler?  multiple times per day . Do you use a spacer with your inhaler? No  Adherence Review: . Does the patient have >5 day gap between last estimated fill date for maintenance inhaler medications? No  Patient reported taking Geritol to boost iron deficiency. Since his recent admission to the hospital, he does report being more active.  He would like another prescription of Benzonatate 100mg  to take as needed for a cough. Patient says that medicine helped him sleep at night. Stated he has been taking Nyquil to help him sleep.  Follow-Up:  Pharmacist Review   Fanny Skates, [place] Pharmacist Assistant [phone]  Faxed patient's [employer] patient assistance application to [facility].  Note that patient requesting refill of benzonatate. Will consult with primary care team noting patient may have to have an evaluation before prescribing. Will request refill of nebulizer solution for patient. -SUPJ  Reviewed by: De Blanch, PharmD Clinical Pharmacist [facility] [phone]

## 2020-03-13 ENCOUNTER — Other Ambulatory Visit: Payer: Self-pay | Admitting: Internal Medicine

## 2020-03-18 ENCOUNTER — Other Ambulatory Visit: Payer: Self-pay

## 2020-03-18 MED ORDER — IPRATROPIUM BROMIDE 0.02 % IN SOLN
0.5000 mg | Freq: Four times a day (QID) | RESPIRATORY_TRACT | 12 refills | Status: DC
Start: 2020-03-18 — End: 2020-09-29

## 2020-03-29 ENCOUNTER — Telehealth: Payer: Self-pay | Admitting: Pharmacist

## 2020-03-29 NOTE — Progress Notes (Addendum)
Chronic Care Management Pharmacy Assistant   Name: ARBER WIEMERS  MRN: [id number] DOB: 06/25/1961  Reason for Encounter: Patient Assistance Follow up   PCP : Colon Branch, MD  Allergies:  No Known Allergies  Medications: Outpatient Encounter Medications as of 03/29/2020  Medication Sig  . albuterol (VENTOLIN HFA) 108 (90 Base) MCG/ACT inhaler Inhale 2 puffs into the lungs every 6 (six) hours as needed for wheezing or shortness of breath.  Marland Kitchen aspirin EC 81 MG tablet Take 81 mg by mouth daily.  (Patient not taking: Reported on 03/05/2020)  . Cholecalciferol (VITAMIN D3) 50 MCG (2000 UT) TABS Take 1 tablet by mouth daily.  . ferrous sulfate 325 (65 FE) MG tablet Take 1 tablet (325 mg total) by mouth daily with breakfast.  . furosemide (LASIX) 20 MG tablet Take 1 tablet (20 mg total) by mouth daily.  . hydrOXYzine (ATARAX/VISTARIL) 10 MG tablet Take 1 tablet (10 mg total) by mouth 3 (three) times daily as needed.  Marland Kitchen ipratropium (ATROVENT HFA) 17 MCG/ACT inhaler Inhale 2 puffs into the lungs every 4 (four) hours as needed for wheezing.  Marland Kitchen ipratropium (ATROVENT) 0.02 % nebulizer solution Take 2.5 mLs (0.5 mg total) by nebulization 4 (four) times daily.  . Multiple Vitamins-Minerals (ONE-A-DAY MENS VITACRAVES) CHEW Chew 1 tablet by mouth daily.  Marland Kitchen omeprazole (PRILOSEC) 20 MG capsule Take 1 capsule (20 mg total) by mouth daily.  . potassium chloride SA (KLOR-CON M20) 20 MEQ tablet Take 1 tablet (20 mEq total) by mouth daily.  . propranolol (INDERAL) 20 MG tablet Take 1 tablet (20 mg total) by mouth 2 (two) times daily.  Marland Kitchen spironolactone (ALDACTONE) 50 MG tablet Take 1 tablet (50 mg total) by mouth daily.  Marland Kitchen topiramate (TOPAMAX) 25 MG tablet TAKE 1 TABLET BY MOUTH EVERY DAY  . vitamin C (ASCORBIC ACID) 250 MG tablet Take 250 mg by mouth daily.   No facility-administered encounter medications on file as of 03/29/2020.    Current Diagnosis: Patient Active Problem List   Diagnosis Date Noted  .  COPD (chronic obstructive pulmonary disease) ([place]) 04/09/2019  . PCP NOTES >>>>>>>>>>>>>>>>>> 02/05/2019  . Chronic hepatitis ([place]) 02/05/2019  . EtOH dependence ([place]) 01/25/2019  . Tobacco dependence 01/25/2019  . Alcoholic cirrhosis of liver with ascites ([place]) 01/25/2019  . Hyponatremia 01/25/2019  . Iron deficiency anemia   . Symptomatic anemia 01/24/2019  . ANXIETY 08/20/2007  . 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 G E R D 08/20/2007    Goals Addressed   None     Made outbound call to [facility] C[employer] to follow up on patient assistance application. Per Manuela Schwartz, the application is incomplete missing the strength of the prescription. This information can be submitted via escript, refax original paperwork, or clinical pharmacist can provide a verbal  prescription over the phone. The [phone] option 3.   Made contact with the patient to confirm he received his ipratropium (ATROVENT) 0.02 % nebulizer solution. Patient stated he did not receive a phone call from [facility] informing him this medication was ready for pick up. Advised that he contact the pharmacy today because a prescription had been sent on his behalf [facility]18[facility] [address] [place] [address]. Also asked the patient to contact me if there were any further issues with his medicine.  Follow-Up:  Pharmacist Review and Scheduled Follow-Up With Clinical Pharmacist on Tuesday November 16th @ 1:00pm over the telephone  Fanny Skates, [place] Pharmacist Assistant [phone]  Called and verified strength of Spiriva respimat. PAP approved on 03/29/20 through 07/02/20.  Notification letter submitted for scan. -YYFR Reviewed by: De Blanch, PharmD Clinical Pharmacist [facility] at [facility] [phone]

## 2020-03-30 ENCOUNTER — Ambulatory Visit: Payer: Medicare Other | Admitting: Gastroenterology

## 2020-04-01 ENCOUNTER — Other Ambulatory Visit: Payer: Self-pay | Admitting: Internal Medicine

## 2020-04-15 ENCOUNTER — Telehealth: Payer: Self-pay | Admitting: Pharmacist

## 2020-04-15 NOTE — Progress Notes (Addendum)
Chronic Care Management Pharmacy Assistant   Name: William Whitaker  MRN: [id number] DOB: [birth year]  Reason for Encounter: Disease State  Patient Questions:  1.  Have you seen any other providers since your last visit? No  2.  Any changes in your medicines or health? No    PCP : Colon Branch, MD   Their chronic conditions include: COPD/Tobacco Use Disorder, Cirrhosis, Hep C, Anxiety  Allergies:  No Known Allergies  Medications: Outpatient Encounter Medications as of 04/15/2020  Medication Sig  . albuterol (VENTOLIN HFA) 108 (90 Base) MCG/ACT inhaler Inhale 2 puffs into the lungs every 6 (six) hours as needed for wheezing or shortness of breath.  Marland Kitchen aspirin EC 81 MG tablet Take 81 mg by mouth daily.  (Patient not taking: Reported on 03/05/2020)  . Cholecalciferol (VITAMIN D3) 50 MCG (2000 UT) TABS Take 1 tablet by mouth daily.  . ferrous sulfate 325 (65 FE) MG tablet Take 1 tablet (325 mg total) by mouth daily with breakfast.  . furosemide (LASIX) 20 MG tablet Take 1 tablet (20 mg total) by mouth daily.  . hydrOXYzine (ATARAX/VISTARIL) 10 MG tablet Take 1 tablet (10 mg total) by mouth 3 (three) times daily as needed.  Marland Kitchen ipratropium (ATROVENT HFA) 17 MCG/ACT inhaler Inhale 2 puffs into the lungs every 4 (four) hours as needed for wheezing.  Marland Kitchen ipratropium (ATROVENT) 0.02 % nebulizer solution Take 2.5 mLs (0.5 mg total) by nebulization 4 (four) times daily.  . Multiple Vitamins-Minerals (ONE-A-DAY MENS VITACRAVES) CHEW Chew 1 tablet by mouth daily.  Marland Kitchen omeprazole (PRILOSEC) 20 MG capsule Take 1 capsule (20 mg total) by mouth daily.  . potassium chloride SA (KLOR-CON M20) 20 MEQ tablet Take 1 tablet (20 mEq total) by mouth daily.  . propranolol (INDERAL) 20 MG tablet Take 1 tablet (20 mg total) by mouth 2 (two) times daily.  Marland Kitchen spironolactone (ALDACTONE) 50 MG tablet Take 1 tablet (50 mg total) by mouth daily.  Marland Kitchen topiramate (TOPAMAX) 25 MG tablet Take 1 tablet (25 mg total) by mouth daily.    . vitamin C (ASCORBIC ACID) 250 MG tablet Take 250 mg by mouth daily.   No facility-administered encounter medications on file as of 04/15/2020.    Current Diagnosis: Patient Active Problem List   Diagnosis Date Noted  . COPD (chronic obstructive pulmonary disease) ([place]) 04/09/2019  . PCP NOTES >>>>>>>>>>>>>>>>>> 02/05/2019  . Chronic hepatitis ([place]) 02/05/2019  . EtOH dependence ([place]) 01/25/2019  . Tobacco dependence 01/25/2019  . Alcoholic cirrhosis of liver with ascites ([place]) 01/25/2019  . Hyponatremia 01/25/2019  . Iron deficiency anemia   . Symptomatic anemia 01/24/2019  . ANXIETY 08/20/2007  . 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 G E R D 08/20/2007    Goals Addressed   None    . Current COPD regimen: Spiriva inhaler  . No flowsheet data found.   . Any recent hospitalizations or ED visits since last visit with CPP? No   . Reports COPD symptoms, including Symptoms worse at night and Wheezing   . What recent interventions/DTPs have been made by any provider to improve breathing since last visit:  . Have you had exacerbation/flare-up since last visit? No   . What do you do when you are short of breath?  Rescue medication  Respiratory Devices/Equipment . Do you have a nebulizer? Yes . Do you use a Peak Flow Meter? No . Do you use a maintenance inhaler? Yes . How often do you forget to use  your daily inhaler? No . Do you use a rescue inhaler? Yes . How often do you use your rescue inhaler?  multiple times per day . Do you use a spacer with your inhaler? No  Adherence Review: . Does the patient have >5 day gap between last estimated fill date for maintenance inhaler medications? No CPP please confirm   04-15-2020: Patient did receive his Spriva inhalers. He got a 90 day supply. He asked that I call him back tomorrow afternoon.  04-20-2020: While reviewing COPD items with the patient I inquired if he was able to pick up [facility]. [facility] stated [facility] not [facility] the solution was ready to be picked up. Stated [facility] and expressed some frustration with that. I asked him if he ever considered receiving his medications with [facility] and explained to him the benefits. Informed him that I would review his medications on the monthly basis for any changes and coordinate a delivery to his door if he were receiving medications with [facility]. Patient stated he would consider this and discuss it more at his follow up visit. Documented preliminary medication plan in preparation for patients chronic care management follow up visit.  Follow-Up:  Pharmacist Review   Fanny Skates, [place] Pharmacist Assistant [phone]  Reviewed by: De Blanch, PharmD Clinical Pharmacist [facility] at [facility] [phone]

## 2020-04-29 ENCOUNTER — Ambulatory Visit: Payer: Medicare Other | Admitting: Gastroenterology

## 2020-05-07 ENCOUNTER — Inpatient Hospital Stay: Payer: Medicare Other | Admitting: Family

## 2020-05-07 ENCOUNTER — Inpatient Hospital Stay: Payer: Medicare Other

## 2020-05-14 DIAGNOSIS — K7460 Unspecified cirrhosis of liver: Secondary | ICD-10-CM | POA: Diagnosis not present

## 2020-05-14 DIAGNOSIS — K766 Portal hypertension: Secondary | ICD-10-CM | POA: Diagnosis not present

## 2020-05-18 ENCOUNTER — Ambulatory Visit: Payer: Medicare Other | Admitting: Pharmacist

## 2020-05-18 ENCOUNTER — Other Ambulatory Visit: Payer: Self-pay | Admitting: Internal Medicine

## 2020-05-18 DIAGNOSIS — J449 Chronic obstructive pulmonary disease, unspecified: Secondary | ICD-10-CM

## 2020-05-18 DIAGNOSIS — K7031 Alcoholic cirrhosis of liver with ascites: Secondary | ICD-10-CM

## 2020-05-18 NOTE — Patient Instructions (Addendum)
Visit Information  Goals Addressed            This Visit's Progress   . Chronic Care Management Pharmacy Care Plan       CARE PLAN ENTRY (see longitudinal plan of care for additional care plan information)  Current Barriers:  . Chronic Disease Management support, education, and care coordination needs related to COPD/Tobacco Use Disorder, Cirrhosis, Hep C, Anxiety   COPD/Tobacco Use Disorder . Pharmacist Clinical Goal(s) o Over the next 90 days, patient will work with PharmD and providers to create an affordable treatment plan for his inhaler regimen o Over the next 90 days, patient will decrease/eliminate his use of cigarettes . Current regimen:   Spiriva Respimat 2.29mcg 2 puffs daily  Albuterol 2 puffs every 6 hours as needed  Ipratropium 0.02% nebulized solution as needed . Interventions: o Completed Low Income Adult nurse for patient  o Completed [employer] patient assistance application (Spiriva received) . Patient self care activities - Over the next 90 days, patient will: o Remain adherent to inhaler regimen o Decrease cigarette smoking by at least 1-2 cigarettes per Cyncere Ruhe  Anxiety . Pharmacist Clinical Goal(s) o Over the next 90 days, patient will work with PharmD and providers to reduce symptoms/limitations of anxiety . Current regimen:  o Hydroxyzine 10mg  TID prn . Interventions: o Offered behavioral health support. Patient would like to wait until he has less going on . Patient self care activities - Over the next 90 days, patient will: o Continue trial of hydroxyzine for anxiety  Medication management . Pharmacist Clinical Goal(s): o Over the next 90 days, patient will work with PharmD and providers to maintain optimal medication adherence . [facility] . Interventions o Comprehensive medication review performed. o Continue current medication management strategy . Patient self care activities - Over the next 90 days, patient  will: o Focus on medication adherence by filling medications appropriately o Take medications as prescribed o Report any questions or concerns to PharmD and/or provider(s)  Please see past updates related to this goal by clicking on the "Past Updates" button in the selected goal         The patient verbalized understanding of instructions, educational materials, and care plan provided today and agreed to receive a mailed copy of patient instructions, educational materials, and care plan.   Telephone follow up appointment with pharmacy team member scheduled for: 08/19/2020  De Blanch, PharmD Clinical Pharmacist [facility] at [facility] [phone]

## 2020-05-18 NOTE — Chronic Care Management (AMB) (Signed)
Chronic Care Management Pharmacy  Name: William Whitaker  MRN: [id number] DOB: August 28, 1960  Chief Complaint/ HPI  William Whitaker,  59 y.o. , male presents for their Follow-Up CCM visit with the clinical pharmacist via telephone due to COVID-19 Pandemic.  PCP : William Branch, MD  Their chronic conditions include: COPD/Tobacco Use Disorder, Cirrhosis, Hep C, Anxiety  Office Visits: 02/10/20: Visit w/ Dr. Larose Whitaker -  ED follow up. Patient reported feeling much better since PRBC transfusion; less tired, less short of breath. Advised to continue iron supplement on an empty stomach. Lab work ordered.  Consult Visit: 05/14/20: Ultrasound of Abdomen Limited Right Upper Quadrant IMPRESSION: 1. Cirrhosis with evidence of portal hypertension. 2. No ascites. 3. Patent main portal vein with hepatopetal flow. 4. No gallstone.  03/05/20: (Oncology) Patient presented in th office of Dr. Vergia Whitaker for evaluation for recent blood transfusion. IV iron not needed. Patient was advised to continue taking his oral iron supplements. Will f/u in eight weeks.  ED Visit: 02/04/20; Patient was referred to the ED by Dr. For low hemoglobin. Patient reported recent lightheadedness, shortness of breath. Chest x-ray done at the Ed was found to be unremarkable. Labs repeated at ED resulted in a hemoglobin of 6.5, mild hyponatremia present. Iron panel confirmed significant iron deficiency. Patient was not willing to be admitted to the hospital for further work-up. He was given 1 unit of blood transfusion, and iron therapy was initialed. Was prescribed Ferrous Sulfate 325 mg tabs to take daily.  Medications: Outpatient Encounter Medications as of 05/18/2020  Medication Sig  . Tiotropium Bromide Monohydrate (SPIRIVA RESPIMAT) 2.5 MCG/ACT AERS Inhale 2.5 mcg into the lungs daily. 2 puffs daily - through [employer] PAP  . albuterol (VENTOLIN HFA) 108 (90 Base) MCG/ACT inhaler Inhale 2 puffs into the lungs every 6 (six) hours as needed for  wheezing or shortness of breath.  Marland Kitchen aspirin EC 81 MG tablet Take 81 mg by mouth daily.  (Patient not taking: Reported on 03/05/2020)  . Cholecalciferol (VITAMIN D3) 50 MCG (2000 UT) TABS Take 1 tablet by mouth daily.  . ferrous sulfate 325 (65 FE) MG tablet Take 1 tablet (325 mg total) by mouth daily with breakfast.  . furosemide (LASIX) 20 MG tablet Take 1 tablet (20 mg total) by mouth daily.  . hydrOXYzine (ATARAX/VISTARIL) 10 MG tablet Take 1 tablet (10 mg total) by mouth 3 (three) times daily as needed.  Marland Kitchen ipratropium (ATROVENT HFA) 17 MCG/ACT inhaler Inhale 2 puffs into the lungs every 4 (four) hours as needed for wheezing.  Marland Kitchen ipratropium (ATROVENT) 0.02 % nebulizer solution Take 2.5 mLs (0.5 mg total) by nebulization 4 (four) times daily.  . Multiple Vitamins-Minerals (ONE-A-Alyjah Lovingood MENS VITACRAVES) CHEW Chew 1 tablet by mouth daily.  Marland Kitchen omeprazole (PRILOSEC) 20 MG capsule Take 1 capsule (20 mg total) by mouth daily.  . potassium chloride SA (KLOR-CON M20) 20 MEQ tablet Take 1 tablet (20 mEq total) by mouth daily.  . propranolol (INDERAL) 20 MG tablet Take 1 tablet (20 mg total) by mouth 2 (two) times daily.  Marland Kitchen spironolactone (ALDACTONE) 50 MG tablet Take 1 tablet (50 mg total) by mouth daily.  Marland Kitchen topiramate (TOPAMAX) 25 MG tablet Take 1 tablet (25 mg total) by mouth daily.  . vitamin C (ASCORBIC ACID) 250 MG tablet Take 250 mg by mouth daily.   No facility-administered encounter medications on file as of 05/18/2020.   SDOH Screenings   Alcohol Screen: Low Risk   . Last Alcohol Screening  Score (AUDIT): 0  Depression (PHQ2-9):   . PHQ-2 Score: Not on file  Financial Resource Strain: High Risk  . Difficulty of Paying Living Expenses: Very hard  Food Insecurity:   . Worried About Charity fundraiser in the Last Year: Not on file  . Ran Out of Food in the Last Year: Not on file  Housing:   . Last Housing Risk Score: Not on file  Physical Activity:   . Days of Exercise per Week: Not on file    . Minutes of Exercise per Session: Not on file  Social Connections:   . Frequency of Communication with Friends and Family: Not on file  . Frequency of Social Gatherings with Friends and Family: Not on file  . Attends Religious Services: Not on file  . Active Member of Clubs or Organizations: Not on file  . Attends Archivist Meetings: Not on file  . Marital Status: Not on file  Stress:   . Feeling of Stress : Not on file  Tobacco Use: High Risk  . Smoking Tobacco Use: Current Every William Whitaker Smoker  . Smokeless Tobacco Use: Never Used  Transportation Needs:   . Film/video editor (Medical): Not on file  . Lack of Transportation (Non-Medical): Not on file     Current Diagnosis/Assessment:  Goals Addressed            This Visit's Progress   . Chronic Care Management Pharmacy Care Plan       CARE PLAN ENTRY (see longitudinal plan of care for additional care plan information)  Current Barriers:  . Chronic Disease Management support, education, and care coordination needs related to COPD/Tobacco Use Disorder, Cirrhosis, Hep C, Anxiety   COPD/Tobacco Use Disorder . Pharmacist Clinical Goal(s) o Over the next 90 days, patient will work with PharmD and providers to create an affordable treatment plan for his inhaler regimen o Over the next 90 days, patient will decrease/eliminate his use of cigarettes . Current regimen:   Spiriva Respimat 2.11mcg 2 puffs daily  Albuterol 2 puffs every 6 hours as needed  Ipratropium 0.02% nebulized solution as needed . Interventions: o Completed Low Income Adult nurse for patient  o Completed [employer] patient assistance application (Spiriva received) . Patient self care activities - Over the next 90 days, patient will: o Remain adherent to inhaler regimen o Decrease cigarette smoking by at least 1-2 cigarettes per William Whitaker  Anxiety . Pharmacist Clinical Goal(s) o Over the next 90 days, patient will work with PharmD and  providers to reduce symptoms/limitations of anxiety . Current regimen:  o Hydroxyzine 10mg  TID prn . Interventions: o Offered behavioral health support. Patient would like to wait until he has less going on . Patient self care activities - Over the next 90 days, patient will: o Continue trial of hydroxyzine for anxiety  Medication management . Pharmacist Clinical Goal(s): o Over the next 90 days, patient will work with PharmD and providers to maintain optimal medication adherence . Current pharmacy: [facility] . Interventions o Comprehensive medication review performed. o Continue current medication management strategy . Patient self care activities - Over the next 90 days, patient will: o Focus on medication adherence by filling medications appropriately o Take medications as prescribed o Report any questions or concerns to PharmD and/or provider(s)  Please see past updates related to this goal by clicking on the "Past Updates" button in the selected goal        Social Hx: Med times: 7am, 10am, 2pm (  stomach pill and vitamins), HS Has sons who wanted to take him to the beach for his birthday, but he was not up for it due to medication burden  COPD /Tobacco   Last spirometry score: None noted   Gold Grade: Unable to assess Current COPD Classification:  B (high sx, <2 exacerbations/yr)  Eosinophil count:   Lab Results  Component Value Date/Time   EOSPCT 3 03/05/2020 01:00 PM  %                               Eos (Absolute):  Lab Results  Component Value Date/Time   EOSABS 0.2 03/05/2020 01:00 PM    Tobacco Status:  Social History   Tobacco Use  Smoking Status Current Every Stephine Langbehn Smoker  . Packs/Shataya Winkles: 0.50  . Years: 25.00  . Pack years: 12.50  . Types: Cigarettes  Smokeless Tobacco Never Used  Tobacco Comment   3 PPD, started to decrease to 1/2 ppd 2019, 2021 now smoking 1/4 ppd   Problem Story  Inhalers With insurance his cost was $148 for Lajoyce Lauber  cost the patient over $200 after insurance. He really wanted a medication he could use in his new nebulizer machine.   Tobacco 1/2 PPD Does not smoke within first 30 minutes of waking Wakes up, drinks coffee First cigarette occurs in the afternoon.  Has tried tapering off Has tried patches before. Doesn't know if he gave it a full try.  "I would love to quit smoking"  Cough Patient struggles with a chronic cough. He states he hates to feel the rattling in his chest. We discussed mucinex, but he states he was cautious to take any other meds while on Hep C treatment. He requests refill of benzonatate capsules. He states these help him not to wake up in the middle of the night with coughing fits.   Patient has failed these meds in past: Yupelri (cost) Patient is currently uncontrolled on the following medications:   Spiriva Respimat 2.27mcg 2 puffs daily  Albuterol 2 puffs every 6 hours as needed  Ipratropium 0.02% nebulized solution as needed Using maintenance inhaler regularly? No (Does not have a maintenance inhaler) Frequency of rescue inhaler use:  3-5x times per week only uses nebulizer 1-2 times per week  Update 05/18/20 Approved for [employer] Patient Assistance on 03/29/20 through 07/02/20. Currently has Spiriva? Yes How often taking Spiriva? Daily How often using rescue inhaler? 3-4 times per Linard Daft Feels his cough has improved. Smoking down to 1/4 pack per Tashya Alberty  We discussed:  smoking cessation   Plan -Continue current medications -Update Sp[employer] patient assistance in January  Cirrhosis/Alcohol Use Disorder    Patient has failed these meds in past: nadolol (fatigue) Patient is currently controlled on the following medications:   Furosemide 20mg  daily  Potassium 20mEq daily  Omeprazole 20mg  daily  Propranolol 20mg  twice daily  Spironolactone 50mg  daily  Topiramate 25mg  daily  No longer drinking. He did this without the assistance of rehab or AA.  Drinks  non-alcoholic beers. Budweiser zero  We discussed:  Encouragement of alcohol cessation   Update 05/18/20 Was told his liver function is doing well. He has been taking his iron daily. His energy level has improved.  Plan -Continue current medications   Hep C    Patient has failed these meds in past: None noted  Patient is currently controlled on the following medications:   Epclusa daily x 12 weeks.  Epclusa: Lots of fatigue. He has to drag himself out of bed. Headaches.   Has 2 more pills he has to take and then he will complete his treatment.  Took Hep C course for 3 months  We discussed:  Encouragement on completing Hep C treatment   Update 05/18/20 Completed Epclusa? Yes and he has cleared Hep C  Plan -Continue current medications   Anxiety    Patient has failed these meds in past: None noted  Patient is currently uncontrolled on the following medications:   Hydroxyzine 10mg  TID prn  Escitalopram Got bad headache with escitalopram.  He did not like how it made him feel. He didn't feel like it helped his anxiety. He took it for a few days. He is adamant about not taking this.   Hydroxyzine Feels like it helps some Takes it as needed. Does not take every Kenishia Plack because he does not have anxiety daily.  He feels like one minute he is on top of the world. The next minute he doesn't know what to do with himself. "Like my nerves are going to shoot out of my head" He felt like valium helped level him out. His prescriber of valium is no longer in the area (free psychiatrist). He was taking about 3 per Emnet Monk. "Either I'm extremely hyper or I can't get out of bed" "I'm overwhelmed with prescriptions right now"  He gets overwhelmed even when going shopping  We discussed:  Offered behavioral health support, but patient feels like he can't take on anything else right now   Update 05/18/20 Takes hydroxyzine when needed. He is grateful his energy has improved.    Plan -Continue current medications   Future Plan -Get patient looped into behavioral health or with psychiatrist to help further assess his anxiety or any other underlying mental health condition    Back Pain    Patient has failed these meds in past: None noted  Patient is currently controlled on the following medications: . Diclofenac 1% Gel daily  Patient states diclofenac has really been helpful for his back pain, and he is running low.  Wonders if Dr. Larose Whitaker would be willing to refill for him.    Plan -Consult with Dr. Larose Whitaker to see if he is willing to refill diclofenac   Medication Management   [facility] Uses pill box? Yes fills weekly Pt endorses 100% compliance  Miscellaneous Meds Aspirin 81mg  Diclofenac gel Multivitamin Vitamin C 250mg  Magnesium 250mg  Acetaminophen 500mg  at 2pm daily (shoulder pain)  We discussed: Discussed benefits of medication synchronization, packaging and delivery as well as enhanced pharmacist oversight with [employer]. Patient states he likes going out to the pharmacy. He seems open to idea of filling with [facility], but is happy with his current regimen now that they have him set up with automatic refill.   Plan -Continue current medication management strategy    Follow up: 3 month phone visit  De Blanch, PharmD Clinical Pharmacist [facility] [phone]

## 2020-06-01 ENCOUNTER — Ambulatory Visit: Payer: Medicare Other | Admitting: Gastroenterology

## 2020-06-01 ENCOUNTER — Encounter: Payer: Self-pay | Admitting: Gastroenterology

## 2020-06-01 VITALS — BP 138/74 | HR 80 | Ht 73.0 in | Wt 199.2 lb

## 2020-06-01 DIAGNOSIS — K7031 Alcoholic cirrhosis of liver with ascites: Secondary | ICD-10-CM | POA: Diagnosis not present

## 2020-06-01 DIAGNOSIS — I8500 Esophageal varices without bleeding: Secondary | ICD-10-CM | POA: Diagnosis not present

## 2020-06-01 DIAGNOSIS — Z86010 Personal history of colonic polyps: Secondary | ICD-10-CM

## 2020-06-01 DIAGNOSIS — K766 Portal hypertension: Secondary | ICD-10-CM

## 2020-06-01 DIAGNOSIS — D509 Iron deficiency anemia, unspecified: Secondary | ICD-10-CM

## 2020-06-01 DIAGNOSIS — B1920 Unspecified viral hepatitis C without hepatic coma: Secondary | ICD-10-CM

## 2020-06-01 DIAGNOSIS — K3189 Other diseases of stomach and duodenum: Secondary | ICD-10-CM

## 2020-06-01 NOTE — Patient Instructions (Signed)
If you are age 60 or older, your body mass index should be between 23-30. Your Body mass index is 26.29 kg/m. If this is out of the aforementioned range listed, please consider follow up with your Primary Care Provider.  If you are age 46 or younger, your body mass index should be between 19-25. Your Body mass index is 26.29 kg/m. If this is out of the aformentioned range listed, please consider follow up with your Primary Care Provider.   Please have your labs drawn.   It was a pleasure to see you today!  Vito Cirigliano, D.O.

## 2020-06-01 NOTE — Progress Notes (Signed)
P  Chief Complaint:    Cirrhosis, anemia  GI History: 59 year old male with decompensated cirrhosis2/2HCV and alcohol (quit drinking 01/2019), with recent hospital admission in 01/2019 with ascites (negative for SBP), lower extremity edema,SOB, profound IDA (admission hemoglobin 6.3). Treated with large volume paracentesis (SAAG >1.1, -SBP, cytology negative)and started on Lasix/Aldactone. EGD with EV 1 and 2, PHG. Given PRBC x2, Feraheme x2 for severe IDA (presumed d/t PHGwith oozing). Started on nadolol for primary prophylaxis and Protonix.  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 Otherwise negative -Viral hepatitis vaccination: Started HAV/HBV vaccine series (dose #2 Twinrix today) -Flu vaccine: UTD- fall 2020 -Liver biopsy: None -Medications: Lasix 20 mg, Aldactone 50 mg, propranolol20 mg bid (did not tolerate nadolol); low-sodium diet -MELD:10 -Child Pugh score: B(8 points)  -HCV antibody positive -HCV viral load: 2.1 million copies -Treated with Epclusa with SVR per patient  Endoscopic history: -EGD (01/2019, Dr. Rush Landmark): Grade 1 and grade 2 varices, diffuse portal hypertensive gastropathy with oozing -Colonoscopy (01/2019, Dr. Rush Landmark): 4 small tubular adenomas, internal hemorrhoids. Repeat in 3 years  HPI:     Patient is a 59 y.o. male presenting to the [facility]astroenterology Clinic for follow-up.  Last seen by me in 06/2019.  Also follows with Roosevelt Locks at [place] for HCV and cirrhosis.  Was treated with Epclusa with SVR on repeat labs.  Was last seen there 11/12/2019.  Has follow-up appointment on 06/07/2020.  Labs in 02/2020 notable for iron deficiency anemia, was treated with  pRBC transfusion in [facility], and was referred to Hematology.  Was seen by Laverna Peace 03/2020 and treated with p.o. iron with plan for 8-week follow-up.  He had to cancel appointment due to significant cough, and rescheduled for December.  Today, he states he feels well.  Improved energy lately.  Good p.o. intake.  Watching protein, sodium intake.  02/04/2020: -Sodium 134, BUN/creatinine 10/0.74 -AST/ALT 20/10, ALP 66, T bili 0.8 -H/H 6.5/23, MCV/RDW 58/21.5 -FOBT negative  03/05/2020: -H/H 9/32, MCV/RDW 72/25 -Sodium 134, BUN/creatinine 11/0.77 -AST/ALT 36/15, ALP 62, T bili 0.8  Review of systems:     No chest pain, no SOB, no fevers, no urinary sx   Past Medical History:  Diagnosis Date  . Anxiety   . Bronchitis   . GERD (gastroesophageal reflux disease)   . Insomnia     Patient's surgical history, family medical history, social history, medications and allergies were all reviewed in Epic    Current Outpatient Medications  Medication Sig Dispense Refill  . albuterol (VENTOLIN HFA) 108 (90 Base) MCG/ACT inhaler Inhale 2 puffs into the lungs every 6 (six) hours as needed for wheezing or shortness of breath. 18 g 1  . Cholecalciferol (VITAMIN D3) 50 MCG (2000 UT) TABS Take 1 tablet by mouth daily.    . ferrous sulfate 325 (65 FE) MG tablet Take 1 tablet (325 mg total) by mouth daily with breakfast. 100 tablet 3  . furosemide (LASIX) 20 MG tablet Take 1 tablet (20 mg total) by mouth daily. 90 tablet 1  . hydrOXYzine (ATARAX/VISTARIL) 10 MG tablet Take 1 tablet (10 mg total) by mouth 3 (three) times daily as needed. 30 tablet 3  . ipratropium (ATROVENT HFA) 17 MCG/ACT inhaler Inhale 2 puffs into the lungs every 4 (four) hours as needed for wheezing.  12.9 g 5  . ipratropium (ATROVENT) 0.02 % nebulizer solution Take 2.5 mLs (0.5 mg total) by nebulization 4 (four) times daily. 75 mL 12  . Multiple Vitamins-Minerals (ONE-A-DAY MENS VITACRAVES) CHEW Chew 1 tablet by mouth daily.     Marland Kitchen omeprazole (PRILOSEC) 20 MG capsule Take 1 capsule (20 mg total) by mouth daily. 90 capsule 3  . potassium chloride SA (KLOR-CON M20) 20 MEQ tablet Take 1 tablet (20 mEq total) by mouth daily. 30 tablet 5  . propranolol (INDERAL) 20 MG tablet Take 1 tablet (20 mg total) by mouth 2 (two) times daily. 180 tablet 1  . spironolactone (ALDACTONE) 50 MG tablet Take 1 tablet (50 mg total) by mouth daily. 90 tablet 1  . Tiotropium Bromide Monohydrate (SPIRIVA RESPIMAT) 2.5 MCG/ACT AERS Inhale 2.5 mcg into the lungs daily. 2 puffs daily - through [employer] PAP    . topiramate (TOPAMAX) 25 MG tablet Take 1 tablet (25 mg total) by mouth daily. 90 tablet 1  . vitamin C (ASCORBIC ACID) 250 MG tablet Take 250 mg by mouth daily.    . Vitamin Mixture (VITAMIN E COMPLETE PO) Take by mouth.    Marland Kitchen aspirin EC 81 MG tablet Take 81 mg by mouth daily.  (Patient not taking: Reported on 03/05/2020)     No current facility-administered medications for this visit.    Physical Exam:     BP 138/74 (BP Location: Left Arm, Patient Position: Sitting, Cuff Size: Normal)   Pulse 80   Ht 6\' 1"  (1.854 m)   Wt 199 lb 4 oz (90.4 kg)   BMI 26.29 kg/m   GENERAL:  Pleasant male in NAD PSYCH: : Cooperative, normal affect EENT:  conjunctiva pink, mucous membranes moist, neck supple without masses Musculoskeletal:  Normal muscle tone, normal strength NEURO: Alert and oriented x 3, no focal neurologic deficits   IMPRESSION and PLAN:    1) Cirrhosis 2) Ascites 3) Esophageal varices 4) Coagulopathy 5) Portal hypertensive gastropathy 59 year old male with decompensated cirrhosis 2/2 EtOH/HCV, complicated by ascites, esophageal varices, PHG.  No EtOH since 01/2019.  Completed HCV therapy with Epclusa.  -Applauded his continued abstinence of all EtOH -Continue Lasix/Aldactone as currently prescribed -Continue propranolol -Follows with Roosevelt Locks at [facility].  Has follow-up appointment next week -If repeat labs with  ongoing IDA, can plan for repeat EGD for EV surveillance and treatment of any bleeding gastropathy -Continue low-sodium diet -Completed HAV/HBV vaccine series -HCC screening up-to-date  6) IDA - Repeat CBC, iron panel - Continue current iron supplement. Can take QOD due to GI side effects - Continue f/u in [facility]e Clinic as scheduled - If ongoing anemia on labs, can plan for EGD as above. Would be done at [facility]   7) HCV: - Has completed Epclusa x12 weeks with reported SVR per patient (no HCV labs seen in EMR though) - F/u in [facility] next week as scheduled  8) Tubular adenomas: -Repeat colonoscopy in 2023 for ongoing surveillance  RTC in 6 months or sooner prn  I spent 45 minutes of time, including in depth chart review, independent review of results as outlined above, communicating results with the patient directly, face-to-face time with the patient, coordinating care, ordering studies and medications as appropriate, and documentation.          Lavena Bullion ,DO, FACG 06/01/2020, 3:36 PM

## 2020-06-02 ENCOUNTER — Telehealth: Payer: Self-pay

## 2020-06-02 NOTE — Telephone Encounter (Signed)
Called and left a message with lab and f/u appt.  Pt stopped by 11/30 and needs labs per his GI doctor, pt had cancelled his 05/07/20 appts and per 06/01/20 inbasket we need to r/s lab and visit...Marland KitchenAOM

## 2020-06-10 ENCOUNTER — Other Ambulatory Visit: Payer: Self-pay | Admitting: Internal Medicine

## 2020-06-22 ENCOUNTER — Telehealth: Payer: Self-pay

## 2020-06-22 ENCOUNTER — Inpatient Hospital Stay: Payer: Medicare Other

## 2020-06-22 ENCOUNTER — Inpatient Hospital Stay: Payer: Medicare Other | Admitting: Family

## 2020-06-22 NOTE — Telephone Encounter (Signed)
Patient called in to cancel todays appt with lab and sarah as his sone has covid, he states that he will call back to r/s.... AOm

## 2020-07-05 ENCOUNTER — Other Ambulatory Visit: Payer: Self-pay | Admitting: Internal Medicine

## 2020-07-06 ENCOUNTER — Ambulatory Visit: Payer: Medicare Other | Admitting: Internal Medicine

## 2020-08-16 NOTE — Progress Notes (Signed)
Chronic Care Management Pharmacy Note  08/19/2020 Name:  William Whitaker MRN:  [id number] DOB:  [birth year]  Subjective: William Whitaker is an 60 y.o. year old male who is a primary patient of Paz, Alda Berthold, MD.  The CCM team was consulted for assistance with disease management and care coordination needs.    Engaged with patient by telephone for follow up visit in response to provider referral for pharmacy case management and/or care coordination services.   Consent to Services:  The patient was given the following information about Chronic Care Management services today, agreed to services, and gave verbal consent: 1. CCM service includes personalized support from designated clinical staff supervised by the primary care provider, including individualized plan of care and coordination with other care providers 2. 24/7 contact phone numbers for assistance for urgent and routine care needs. 3. Service will only be billed when office clinical staff spend 20 minutes or more in a month to coordinate care. 4. Only one practitioner may furnish and bill the service in a calendar month. 5.The patient may stop CCM services at any time (effective at the end of the month) by phone call to the office staff. 6. The patient will be responsible for cost sharing (co-pay) of up to 20% of the service fee (after annual deductible is met). Patient agreed to services and consent obtained.  Patient Care Team: Colon Branch, MD as PCP - General (Internal Medicine) Edythe Clarity, [facility] (Pharmacist)  Recent office visits: 02/10/20: Visit w/ Dr. Larose Kells -  ED follow up. Patient reported feeling much better since PRBC transfusion; less tired, less short of breath. Advised to continue iron supplement on an empty stomach. Lab work ordered.  Recent consult visits: 06/01/20 Bryan Lemma, Gastro) -  05/14/20: Ultrasound of Abdomen Limited Right Upper Quadrant IMPRESSION: 1. Cirrhosis with evidence of portal hypertension. 2. No  ascites. 3. Patent main portal vein with hepatopetal flow. 4. No gallstone.  03/05/20: (Oncology) Patient presented in th office of Dr. Vergia Alcon for evaluationfor recent blood transfusion. IV iron not needed. Patient was advised to continue taking his oral iron supplements. Will f/u in eight weeks.  Hospital visits: 02/04/20;Patient was referred to the ED byDr. For low hemoglobin. Patientreportedrecent lightheadedness, shortness of breath. Chest x-ray done at the Ed was found to be unremarkable. Labs repeated at ED resulted in a hemoglobin of 6.5, mild hyponatremiapresent.Iron panel confirmedsignificant iron deficiency. Patient wasnot willing to be admitted to the hospital for further work-up. He wasgiven1 unit of blood transfusion, and irontherapy was initialed.Was prescribed Ferrous Sulfate 325 mg tabs to take daily  Objective:  Lab Results  Component Value Date   CREATININE 0.77 03/05/2020   BUN 11 03/05/2020   GFR 158.96 02/05/2019   GFRNONAA >60 03/05/2020   GFRAA >60 03/05/2020   NA 133 (L) 03/05/2020   K 3.9 03/05/2020   CALCIUM 9.2 03/05/2020   CO2 24 03/05/2020    Lab Results  Component Value Date/Time   GFR 158.96 02/05/2019 02:30 PM    Last diabetic Eye exam: No results found for: HMDIABEYEEXA  Last diabetic Foot exam: No results found for: HMDIABFOOTEX   No results found for: CHOL, HDL, LDLCALC, LDLDIRECT, TRIG, CHOLHDL  Hepatic Function Latest Ref Rng & Units 03/05/2020 02/05/2019 01/28/2019  Total Protein 6.5 - 8.1 g/dL 7.8 7.3 6.4(L)  Albumin 3.5 - 5.0 g/dL 4.1 3.3(L) 2.7(L)  AST 15 - 41 U/L 36 43(H) 34  ALT 0 - 44 U/L 15 11 11   Alk  Phosphatase 38 - 126 U/L 62 98 71  Total Bilirubin 0.3 - 1.2 mg/dL 0.8 1.1 1.4(H)    Lab Results  Component Value Date/Time   TSH 3.002 01/25/2019 05:44 AM    CBC Latest Ref Rng & Units 03/05/2020 02/10/2020 02/05/2019  WBC 4.0 - 10.5 K/uL 4.7 4.9 6.1  Hemoglobin 13.0 - 17.0 g/dL 9.0(L) 7.5 Repeated and verified X2.(LL)  9.7(L)  Hematocrit 39.0 - 52.0 % 32.4(L) 26.1 Repeated and verified X2.(L) 32.1(L)  Platelets 150 - 400 K/uL 202 151.0 233.0    No results found for: VD25OH  Clinical ASCVD: No  The ASCVD Risk score Mikey Bussing DC Jr., et al., 2013) failed to calculate for the following reasons:   Cannot find a previous HDL lab   Cannot find a previous total cholesterol lab    Depression screen [facility] 2/9 01/24/2019  Decreased Interest 1  Down, Depressed, Hopeless 3  PHQ - 2 Score 4  Altered sleeping 3  Tired, decreased energy 3  Change in appetite 2  Feeling bad or failure about yourself  0  Trouble concentrating 2  Moving slowly or fidgety/restless 2  Suicidal thoughts 0  PHQ-9 Score 16      Social History   Tobacco Use  Smoking Status Current Every Day Smoker  . Packs/day: 0.50  . Years: 25.00  . Pack years: 12.50  . Types: Cigarettes  Smokeless Tobacco Never Used  Tobacco Comment   3 PPD, started to decrease to 1/2 ppd 2019, 2021 now smoking 1/4 ppd   BP Readings from Last 3 Encounters:  06/01/20 138/74  03/05/20 133/90  02/10/20 140/77   Pulse Readings from Last 3 Encounters:  06/01/20 80  03/05/20 75  02/10/20 78   Wt Readings from Last 3 Encounters:  06/01/20 199 lb 4 oz (90.4 kg)  03/05/20 193 lb 12.8 oz (87.9 kg)  02/10/20 190 lb (86.2 kg)    Assessment/Interventions: Review of patient past medical history, allergies, medications, health status, including review of consultants reports, laboratory and other test data, was performed as part of comprehensive evaluation and provision of chronic care management services.   SDOH:  (Social Determinants of Health) assessments and interventions performed: No   CCM Care Plan  No Known Allergies  Medications Reviewed Today    Reviewed by Edythe Clarity, [facility] (Pharmacist) on 08/19/20 at 1445  Med List Status: <None>  Medication Order Taking? Sig Documenting Provider Last Dose Status Informant  albuterol (VENTOLIN HFA) 108 (90  Base) MCG/ACT inhaler [id number] Yes Inhale 2 puffs into the lungs every 6 (six) hours as needed for wheezing or shortness of breath. Colon Branch, MD Taking Active   aspirin EC 81 MG tablet [id number] Yes Take 81 mg by mouth daily. [provider] Taking Active Self           Med Note Laurel Dimmer D   Sat Jan 25, 2019  2:19 AM)    Cholecalciferol (VITAMIN D3) 50 MCG (2000 UT) TABS [id number] Yes Take 1 tablet by mouth daily. [provider] Taking Active   ferrous sulfate 325 (65 FE) MG tablet [id number] Yes Take 1 tablet (325 mg total) by mouth daily with breakfast. Volanda Napoleon, MD Taking Active   furosemide (LASIX) 20 MG tablet [id number] Yes Take 1 tablet (20 mg total) by mouth daily. Colon Branch, MD Taking Active   hydrOXYzine (ATARAX/VISTARIL) 10 MG tablet [id number] Yes Take 1 tablet (10 mg total) by mouth 3 (three) times daily as needed. Paz,  Alda Berthold, MD Taking Active   ipratropium (ATROVENT HFA) 17 MCG/ACT inhaler [id number] Yes Inhale 2 puffs into the lungs every 4 (four) hours as needed for wheezing. Colon Branch, MD Taking Active   ipratropium (ATROVENT) 0.02 % nebulizer solution [id number] Yes Take 2.5 mLs (0.5 mg total) by nebulization 4 (four) times daily. Colon Branch, MD Taking Active   Multiple Vitamins-Minerals (ONE-A-DAY MENS VITACRAVES) CHEW [id number] Yes Chew 1 tablet by mouth daily. [provider] Taking Active Self  omeprazole (PRILOSEC) 20 MG capsule [id number] Yes Take 1 capsule (20 mg total) by mouth daily. Colon Branch, MD Taking Active   potassium chloride SA (KLOR-CON M20) 20 MEQ tablet [id number] Yes Take 1 tablet (20 mEq total) by mouth daily. Colon Branch, MD Taking Active   propranolol (INDERAL) 20 MG tablet [id number] Yes Take 1 tablet (20 mg total) by mouth 2 (two) times daily. Colon Branch, MD Taking Active   spironolactone (ALDACTONE) 50 MG tablet [id number] Yes Take 1 tablet (50 mg total) by mouth daily. Colon Branch, MD Taking Active   Tiotropium  Bromide Monohydrate (SPIRIVA RESPIMAT) 2.5 MCG/ACT AERS [id number] Yes Inhale 2.5 mcg into the lungs daily. 2 puffs daily - through [employer] PAP [provider] Taking Active   topiramate (TOPAMAX) 25 MG tablet [id number] Yes Take 1 tablet (25 mg total) by mouth daily. Colon Branch, MD Taking Active   vitamin C (ASCORBIC ACID) 250 MG tablet [id number] Yes Take 250 mg by mouth daily. [provider] Taking Active Self  Vitamin Mixture (VITAMIN E COMPLETE PO) [id number] Yes Take by mouth. [provider] Taking Active           Patient Active Problem List   Diagnosis Date Noted  . COPD (chronic obstructive pulmonary disease) ([place]) 04/09/2019  . PCP NOTES >>>>>>>>>>>>>>>>>> 02/05/2019  . Chronic hepatitis ([place]) 02/05/2019  . EtOH dependence ([place]) 01/25/2019  . Tobacco dependence 01/25/2019  . Alcoholic cirrhosis of liver with ascites ([place]) 01/25/2019  . Hyponatremia 01/25/2019  . Iron deficiency anemia   . Symptomatic anemia 01/24/2019  . ANXIETY 08/20/2007  . 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 G E R D 08/20/2007    Immunization History  Administered Date(s) Administered  . Hep A / Hep B 06/30/2019  . Hepatitis A, Adult 04/21/2019  . Hepatitis B, ped/adol 03/11/2019  . Influenza Inj Mdck Quad Pf 04/10/2019  . Janssen (J&J) SARS-COV-2 Vaccination 02/04/2020    Conditions to be addressed/monitored:  COPD/Tobacco Use Disorder, Cirrhosis, Hep C, Anxiety  Care Plan : General Pharmacy (Adult)  Updates made by Edythe Clarity, RPH since 08/19/2020 12:00 AM    Problem: COPD, Anxiety, Cirrhosis   Priority: High  Onset Date: 08/19/2020    Long-Range Goal: Patient-Specific Goal   Start Date: 08/19/2020  Expected End Date: 02/16/2021  This Visit's Progress: On track  Priority: High  Note:   Current Barriers:  . Unable to independently afford treatment regimen   Pharmacist Clinical Goal(s):  Marland Kitchen Over the next 120 days, patient will verbalize ability to  afford treatment regimen . achieve adherence to monitoring guidelines and medication adherence to achieve therapeutic efficacy . maintain control of COPD as evidenced by exacerbation frequency  through collaboration with PharmD and provider.   Interventions: . 1:1 collaboration with Colon Branch, MD regarding development and update of comprehensive plan of care as evidenced by provider attestation and co-signature . Inter-disciplinary care team collaboration (see longitudinal plan of care) . Comprehensive medication review  performed; medication list updated in electronic medical record  COPD/Tobacco (Goal: control symptoms and prevent exacerbations)  -controlled     Inhalers With insurance his cost was $148 for Lajoyce Lauber cost the patient over $200 after insurance. He really wanted a medication he could use in his new nebulizer machine.    Tobacco 1/2 PPD Does not smoke within first 30 minutes of waking Wakes up, drinks coffee First cigarette occurs in the afternoon.  Has tried tapering off Has tried patches before. Doesn't know if he gave it a full try.  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 He needs renewal for for Spir[employer] patient assistance for year 2022.  Depression/Anxiety (Goal: Minimize  symptoms) -controlled -Current treatment:  Hydroxyzine 115mTID prn -Medications previously tried/failed: none noted -Reports he takes prn for sleep or if he needs to during the day. -Patient denied behavioral support in the past -Educated on Benefits of medication for symptom control -Recommended to continue current medication  Cirrhosis/Alcohol use disorder (Goal: Maintain) -controlled -Current treatment   Furosemide 2033maily  Potassium 62m31maily  Omeprazole 62mg76mly  Propranolol 62mg 65me daily  Spironolactone 50mg d67m  Topiramate 25mg da69m-Medications previously tried: none noted  No longer drinking. He did this without the assistance of rehab or AA.  Drinks non-alcoholic beers. Budweiser zero -Recommended to continue current medication   Patient Goals/Self-Care Activities . Over the next 120 days, patient will:  - take medications as prescribed focus on medication adherence by pill count collaborate with provider on medication access solutions  Follow Up Plan: The care management team will reach out to the patient again over the next 120 days.        Medication Assistance: PAPobtained through Spiriva medication assistance program.  Enrollment ends 07/02/20  [id number] [place] [phone] #[id number] - [id number], [place] [phone]: Benefits of medication synchronization, packaging and delivery as well as enhanced pharmacist oversight with [employer]. Patient decided to: Continue current medication management strategy  Care Plan and Follow Up Patient Decision:  Patient agrees to Care Plan and Follow-up.  Plan: The care management team  will reach out to the patient again over the next 120 days.  ChristianBeverly MilchClinical Pharmacist [facility] Sum[facility]don [phone]

## 2020-08-18 ENCOUNTER — Other Ambulatory Visit: Payer: Self-pay | Admitting: Internal Medicine

## 2020-08-19 ENCOUNTER — Telehealth: Payer: Self-pay | Admitting: Pharmacist

## 2020-08-19 ENCOUNTER — Ambulatory Visit (INDEPENDENT_AMBULATORY_CARE_PROVIDER_SITE_OTHER): Payer: Medicare Other

## 2020-08-19 DIAGNOSIS — J449 Chronic obstructive pulmonary disease, unspecified: Secondary | ICD-10-CM

## 2020-08-19 DIAGNOSIS — K7031 Alcoholic cirrhosis of liver with ascites: Secondary | ICD-10-CM

## 2020-08-19 DIAGNOSIS — F17200 Nicotine dependence, unspecified, uncomplicated: Secondary | ICD-10-CM

## 2020-08-19 NOTE — Patient Instructions (Addendum)
Visit Information  Goals Addressed            This Visit's Progress   . Manage My Medicine       Timeframe:  Long-Range Goal Priority:  High Start Date:     08/19/20                        Expected End Date:   02/16/21                    Follow Up Date 11/30/20   - call for medicine refill 2 or 3 days before it runs out - keep a list of all the medicines I take; vitamins and herbals too    Why is this important?   . These steps will help you keep on track with your medicines.   Notes: Work with Providers on PAP for Spiriva.      Patient Care Plan: General Pharmacy (Adult)    Problem Identified: COPD, Anxiety, Cirrhosis   Priority: High  Onset Date: 08/19/2020    Long-Range Goal: Patient-Specific Goal   Start Date: 08/19/2020  Expected End Date: 02/16/2021  This Visit's Progress: On track  Priority: High  Note:   Current Barriers:  . Unable to independently afford treatment regimen   Pharmacist Clinical Goal(s):  Marland Kitchen Over the next 120 days, patient will verbalize ability to afford treatment regimen . achieve adherence to monitoring guidelines and medication adherence to achieve therapeutic efficacy . maintain control of COPD as evidenced by exacerbation frequency  through collaboration with PharmD and provider.   Interventions: . 1:1 collaboration with Colon Branch, MD regarding development and update of comprehensive plan of care as evidenced by provider attestation and co-signature . Inter-disciplinary care team collaboration (see longitudinal plan of care) . Comprehensive medication review performed; medication list updated in electronic medical record  COPD/Tobacco (Goal: control symptoms and prevent exacerbations)  -controlled     Inhalers With insurance his cost was $148 for Lajoyce Lauber cost the patient over $200 after insurance. He really wanted a medication he could use in his new nebulizer machine.    Tobacco 1/2 PPD Does not smoke within first 30  minutes of waking Wakes up, drinks coffee First cigarette occurs in the afternoon.  Has tried tapering off Has tried patches before. Doesn't know if he gave it a full try.  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 He needs renewal for for Spiriva patient assistance for year 2022.  Depression/Anxiety (Goal: Minimize symptoms) -controlled -Current treatment:  Hydroxyzine 10mg  TID prn -Medications previously tried/failed: none noted -Reports he takes prn for sleep or if he needs to during the day. -Patient denied behavioral support in the past -Educated on Benefits of medication for symptom control -Recommended to continue current medication  Cirrhosis/Alcohol use disorder (Goal: Maintain) -controlled -Current treatment   Furosemide 20mg  daily  Potassium 53mEq  daily  Omeprazole 20mg  daily  Propranolol 20mg  twice daily  Spironolactone 50mg  daily  Topiramate 25mg  daily -Medications previously tried: none noted  No longer drinking. He did this without the assistance of rehab or AA.  Drinks non-alcoholic beers. [employer] zero -Recommended to continue current medication   Patient Goals/Self-Care Activities . Over the next 120 days, patient will:  - take medications as prescribed focus on medication adherence by pill  count collaborate with provider on medication access solutions  Follow Up Plan: The care management team will reach out to the patient again over the next 120 days.        The patient verbalized understanding of instructions, educational materials, and care plan provided today and agreed to receive a mailed copy of patient instructions, educational materials, and care plan.  Telephone follow up appointment with pharmacy team member scheduled for: 4 months  Edythe Clarity, [facility]  Chronic Obstructive Pulmonary Disease  Chronic obstructive pulmonary disease (COPD) is a long-term (chronic) condition that affects the lungs. COPD is a general term that can be used to describe many different lung problems that cause lung swelling (inflammation) and limit airflow, including chronic bronchitis and emphysema. If you have COPD, your lung function will probably never return to normal. In most cases, it gets worse over time. However, there are steps you can take to slow the progression of the disease and improve your quality of life. What are the causes? This condition may be caused by:  Smoking. This is the most common cause.  Certain genes passed down through families. What increases the risk? The following factors may make you more likely to develop this condition:  Secondhand smoke from cigarettes, pipes, or cigars.  Exposure to chemicals and other irritants such as fumes and dust in the work environment.  Chronic lung conditions or infections. What are the signs or symptoms? Symptoms of this condition include:  Shortness of breath, especially during physical activity.  Chronic cough with a large amount of thick mucus. Sometimes the cough may not have any mucus (dry cough).  Wheezing.  Rapid breaths.  Gray or bluish discoloration (cyanosis) of the skin, especially in your fingers, toes, or lips.  Feeling tired (fatigue).  Weight loss.  Chest tightness.  Frequent  infections.  Episodes when breathing symptoms become much worse (exacerbations).  Swelling in the ankles, feet, or legs. This may occur in later stages of the disease. How is this diagnosed? This condition is diagnosed based on:  Your medical history.  A physical exam. You may also have tests, including:  Lung (pulmonary) function tests. This may include a spirometry test, which measures your ability to exhale properly.  Chest X-ray.  CT scan.  Blood tests. How is this treated? This condition may be treated with:  Medicines. These may include inhaled rescue medicines to treat acute exacerbations as well as long-term, or maintenance, medicines to prevent flare-ups of COPD. ? Bronchodilators help treat COPD by dilating the airways to allow increased airflow and make your breathing more comfortable. ? Steroids can reduce airway inflammation and help prevent exacerbations.  Smoking cessation. If you smoke, your health care provider may ask you to quit, and may also recommend therapy or replacement products to help you quit.  Pulmonary rehabilitation. This may involve working with a team of health care providers and specialists, such as respiratory, occupational, and physical therapists.  Exercise and physical activity. These are beneficial for nearly all people with COPD.  Nutrition therapy to gain weight, if  you are underweight.  Oxygen. Supplemental oxygen therapy is only helpful if you have a low oxygen level in your blood (hypoxemia).  Lung surgery or transplant.  Palliative care. This is to help people with COPD feel comfortable when treatment is no longer working. Follow these instructions at home: Medicines  Take over-the-counter and prescription medicines (inhaled or pills) only as told by your health care provider.  Talk to your health care provider before taking any cough or allergy medicines. You may need to avoid certain medicines that dry out your  airways. Lifestyle  If you are a smoker, the most important thing that you can do is to stop smoking. Do not use any products that contain nicotine or tobacco, such as cigarettes and e-cigarettes. If you need help quitting, ask your health care provider. Continuing to smoke will cause the disease to progress faster.  Avoid exposure to things that irritate your lungs, such as smoke, chemicals, and fumes.  Stay active, but balance activity with periods of rest. Exercise and physical activity will help you maintain your ability to do things you want to do.  Learn and use relaxation techniques to manage stress and to control your breathing.  Get the right amount of sleep and get quality sleep. Most adults need 7 or more hours per night.  Eat healthy foods. Eating smaller, more frequent meals and resting before meals may help you maintain your strength. Controlled breathing Learn and use controlled breathing techniques as directed by your health care provider. Controlled breathing techniques include:  Pursed lip breathing. Start by breathing in (inhaling) through your nose for 1 second. Then, purse your lips as if you were going to whistle and breathe out (exhale) through the pursed lips for 2 seconds.  Diaphragmatic breathing. Start by putting one hand on your abdomen just above your waist. Inhale slowly through your nose. The hand on your abdomen should move out. Then purse your lips and exhale slowly. You should be able to feel the hand on your abdomen moving in as you exhale. Controlled coughing Learn and use controlled coughing to clear mucus from your lungs. Controlled coughing is a series of short, progressive coughs. The steps of controlled coughing are: 1. Lean your head slightly forward. 2. Breathe in deeply using diaphragmatic breathing. 3. Try to hold your breath for 3 seconds. 4. Keep your mouth slightly open while coughing twice. 5. Spit any mucus out into a tissue. 6. Rest and  repeat the steps once or twice as needed. General instructions  Make sure you receive all the vaccines that your health care provider recommends, especially the pneumococcal and influenza vaccines. Preventing infection and hospitalization is very important when you have COPD.  Use oxygen therapy and pulmonary rehabilitation if directed to by your health care provider. If you require home oxygen therapy, ask your health care provider whether you should purchase a pulse oximeter to measure your oxygen level at home.  Work with your health care provider to develop a COPD action plan. This will help you know what steps to take if your condition gets worse.  Keep other chronic health conditions under control as told by your health care provider.  Avoid extreme temperature and humidity changes.  Avoid contact with people who have an illness that spreads from person to person (is contagious), such as viral infections or pneumonia.  Keep all follow-up visits as told by your health care provider. This is important. Contact a health care provider if:  You are coughing  up more mucus than usual.  There is a change in the color or thickness of your mucus.  Your breathing is more labored than usual.  Your breathing is faster than usual.  You have difficulty sleeping.  You need to use your rescue medicines or inhalers more often than expected.  You have trouble doing routine activities such as getting dressed or walking around the house. Get help right away if:  You have shortness of breath while you are resting.  You have shortness of breath that prevents you from: ? Being able to talk. ? Performing your usual physical activities.  You have chest pain lasting longer than 5 minutes.  Your skin color is more blue (cyanotic) than usual.  You measure low oxygen saturations for longer than 5 minutes with a pulse oximeter.  You have a fever.  You feel too tired to breathe  normally. Summary  Chronic obstructive pulmonary disease (COPD) is a long-term (chronic) condition that affects the lungs.  Your lung function will probably never return to normal. In most cases, it gets worse over time. However, there are steps you can take to slow the progression of the disease and improve your quality of life.  Treatment for COPD may include taking medicines, quitting smoking, pulmonary rehabilitation, and changes to diet and exercise. As the disease progresses, you may need oxygen therapy, a lung transplant, or palliative care.  To help manage your condition, do not smoke, avoid exposure to things that irritate your lungs, stay up to date on all vaccines, and follow your health care provider's instructions for taking medicines. This information is not intended to replace advice given to you by your health care provider. Make sure you discuss any questions you have with your health care provider. Document Revised: 06/01/2017 Document Reviewed: 07/24/2016 Elsevier Patient Education  2021 [employer].

## 2020-08-19 NOTE — Progress Notes (Addendum)
    Chronic Care Management Pharmacy Assistant   Name: William Whitaker  MRN: [id number] DOB: [birth year]  Reason for Encounter: PAP  PCP : Colon Branch, MD  Allergies:  No Known Allergies  Medications: Outpatient Encounter Medications as of 08/19/2020  Medication Sig   albuterol (VENTOLIN HFA) 108 (90 Base) MCG/ACT inhaler Inhale 2 puffs into the lungs every 6 (six) hours as needed for wheezing or shortness of breath.   aspirin EC 81 MG tablet Take 81 mg by mouth daily.   Cholecalciferol (VITAMIN D3) 50 MCG (2000 UT) TABS Take 1 tablet by mouth daily.   ferrous sulfate 325 (65 FE) MG tablet Take 1 tablet (325 mg total) by mouth daily with breakfast.   furosemide (LASIX) 20 MG tablet Take 1 tablet (20 mg total) by mouth daily.   hydrOXYzine (ATARAX/VISTARIL) 10 MG tablet Take 1 tablet (10 mg total) by mouth 3 (three) times daily as needed.   ipratropium (ATROVENT HFA) 17 MCG/ACT inhaler Inhale 2 puffs into the lungs every 4 (four) hours as needed for wheezing.   ipratropium (ATROVENT) 0.02 % nebulizer solution Take 2.5 mLs (0.5 mg total) by nebulization 4 (four) times daily.   Multiple Vitamins-Minerals (ONE-A-DAY MENS VITACRAVES) CHEW Chew 1 tablet by mouth daily.   omeprazole (PRILOSEC) 20 MG capsule Take 1 capsule (20 mg total) by mouth daily.   potassium chloride SA (KLOR-CON M20) 20 MEQ tablet Take 1 tablet (20 mEq total) by mouth daily.   propranolol (INDERAL) 20 MG tablet Take 1 tablet (20 mg total) by mouth 2 (two) times daily.   spironolactone (ALDACTONE) 50 MG tablet Take 1 tablet (50 mg total) by mouth daily.   Tiotropium Bromide Monohydrate (SPIRIVA RESPIMAT) 2.5 MCG/ACT AERS Inhale 2.5 mcg into the lungs daily. 2 puffs daily - through [employer] PAP   topiramate (TOPAMAX) 25 MG tablet Take 1 tablet (25 mg total) by mouth daily.   vitamin C (ASCORBIC ACID) 250 MG tablet Take 250 mg by mouth daily.   Vitamin Mixture (VITAMIN E COMPLETE PO) Take by mouth.   No facility-administered  encounter medications on file as of 08/19/2020.    Current Diagnosis: Patient Active Problem List   Diagnosis Date Noted   COPD (chronic obstructive pulmonary disease) ([place]) 04/09/2019   PCP NOTES >>>>>>>>>>>>>>>>>> 02/05/2019   Chronic hepatitis ([place]) 02/05/2019   EtOH dependence ([place]) 01/25/2019   Tobacco dependence 01/65/5374   Alcoholic cirrhosis of liver with ascites ([place]) 01/25/2019   Hyponatremia 01/25/2019   Iron deficiency anemia    Symptomatic anemia 01/24/2019   ANXIETY 08/20/2007   REACTIVE AIRWAY DISEASE 08/20/2007   G E R D 08/20/2007    Goals Addressed   None    Renew patient assistance application form filled out to [employer] for Spiriva 2.5 MCG/ACT. Waiting for patient and provider to complete and sign documentation. Called patient to inquire if they wanted the application mailed to them or if they wanted to come into the office. Patient is required to sign application and to bring/have proof of income. He stated he would be willing to come into office to bring proof of income and sign application once the PAP came in the mail. He would like it mailed to their residence address [place] [address].  Follow-Up:  Pharmacist Review   Charlann Lange, [place] Pharmacist Assistant [phone]

## 2020-08-21 ENCOUNTER — Other Ambulatory Visit: Payer: Self-pay | Admitting: Internal Medicine

## 2020-09-04 ENCOUNTER — Other Ambulatory Visit: Payer: Self-pay | Admitting: Internal Medicine

## 2020-09-10 ENCOUNTER — Encounter: Payer: Self-pay | Admitting: Internal Medicine

## 2020-09-14 ENCOUNTER — Other Ambulatory Visit: Payer: Self-pay | Admitting: Internal Medicine

## 2020-09-29 ENCOUNTER — Ambulatory Visit (INDEPENDENT_AMBULATORY_CARE_PROVIDER_SITE_OTHER): Payer: Medicare Other | Admitting: Internal Medicine

## 2020-09-29 ENCOUNTER — Other Ambulatory Visit: Payer: Self-pay

## 2020-09-29 ENCOUNTER — Encounter: Payer: Self-pay | Admitting: Internal Medicine

## 2020-09-29 VITALS — BP 144/70 | HR 78 | Temp 97.8°F | Resp 18 | Ht 73.0 in | Wt 203.4 lb

## 2020-09-29 DIAGNOSIS — G4700 Insomnia, unspecified: Secondary | ICD-10-CM | POA: Diagnosis not present

## 2020-09-29 DIAGNOSIS — D508 Other iron deficiency anemias: Secondary | ICD-10-CM | POA: Diagnosis not present

## 2020-09-29 DIAGNOSIS — K7031 Alcoholic cirrhosis of liver with ascites: Secondary | ICD-10-CM

## 2020-09-29 DIAGNOSIS — D509 Iron deficiency anemia, unspecified: Secondary | ICD-10-CM | POA: Diagnosis not present

## 2020-09-29 DIAGNOSIS — F419 Anxiety disorder, unspecified: Secondary | ICD-10-CM | POA: Diagnosis not present

## 2020-09-29 DIAGNOSIS — J449 Chronic obstructive pulmonary disease, unspecified: Secondary | ICD-10-CM

## 2020-09-29 DIAGNOSIS — R739 Hyperglycemia, unspecified: Secondary | ICD-10-CM

## 2020-09-29 MED ORDER — FUROSEMIDE 20 MG PO TABS: 20.0000 mg | ORAL_TABLET | Freq: Every day | ORAL | 1 refills | Status: DC

## 2020-09-29 MED ORDER — FERROUS SULFATE 325 (65 FE) MG PO TABS: 325.0000 mg | ORAL_TABLET | Freq: Every day | ORAL | 3 refills | Status: DC

## 2020-09-29 MED ORDER — IPRATROPIUM BROMIDE 0.02 % IN SOLN: 0.5000 mg | Freq: Four times a day (QID) | RESPIRATORY_TRACT | 12 refills | Status: DC

## 2020-09-29 MED ORDER — PROPRANOLOL HCL 20 MG PO TABS: 20.0000 mg | ORAL_TABLET | Freq: Two times a day (BID) | ORAL | 1 refills | Status: DC

## 2020-09-29 MED ORDER — ALBUTEROL SULFATE HFA 108 (90 BASE) MCG/ACT IN AERS: 2.0000 | INHALATION_SPRAY | Freq: Four times a day (QID) | RESPIRATORY_TRACT | 5 refills | Status: DC | PRN

## 2020-09-29 MED ORDER — OMEPRAZOLE 20 MG PO CPDR: 20.0000 mg | DELAYED_RELEASE_CAPSULE | Freq: Every day | ORAL | 3 refills | Status: DC

## 2020-09-29 MED ORDER — HYDROXYZINE HCL 10 MG PO TABS: 10.0000 mg | ORAL_TABLET | Freq: Three times a day (TID) | ORAL | 1 refills | Status: DC | PRN

## 2020-09-29 MED ORDER — POTASSIUM CHLORIDE CRYS ER 20 MEQ PO TBCR: 20.0000 meq | EXTENDED_RELEASE_TABLET | Freq: Every day | ORAL | 1 refills | Status: DC

## 2020-09-29 MED ORDER — ATROVENT HFA 17 MCG/ACT IN AERS: 2.0000 | INHALATION_SPRAY | RESPIRATORY_TRACT | 5 refills | Status: DC | PRN

## 2020-09-29 MED ORDER — TOPIRAMATE 25 MG PO TABS: 25.0000 mg | ORAL_TABLET | Freq: Every day | ORAL | 1 refills | Status: DC

## 2020-09-29 MED ORDER — SPIRONOLACTONE 50 MG PO TABS: 50.0000 mg | ORAL_TABLET | Freq: Every day | ORAL | 1 refills | Status: DC

## 2020-09-29 NOTE — Progress Notes (Signed)
Subjective:    Patient ID: William Whitaker, male    DOB: 19-Jul-1960, 60 y.o.   MRN: [id number]  DOS:  09/29/2020 Type of visit - description: Follow-up Since the last office visit he is doing well. Saw hematology and GI, notes reviewed. Needs some refills.    Review of Systems Denies fever chills No nausea or vomiting.  No diarrhea.  Takes iron, occasionally gets constipated No dysuria gross hematuria Minimal cough, occasional wheezing.  Past Medical History:  Diagnosis Date  . Anxiety   . Bronchitis   . GERD (gastroesophageal reflux disease)   . Insomnia     Past Surgical History:  Procedure Laterality Date  . BIOPSY  01/27/2019   Procedure: BIOPSY;  Surgeon: William Whitaker., MD;  Location: [facility];  Service: Gastroenterology;;  . COLONOSCOPY WITH PROPOFOL N/A 01/27/2019   Procedure: COLONOSCOPY WITH PROPOFOL;  Surgeon: William Whitaker., MD;  Location: [facility];  Service: Gastroenterology;  Laterality: N/A;  . ESOPHAGOGASTRODUODENOSCOPY (EGD) WITH PROPOFOL N/A 01/27/2019   Procedure: ESOPHAGOGASTRODUODENOSCOPY (EGD) WITH PROPOFOL;  Surgeon: William Whitaker., MD;  Location: [facility];  Service: Gastroenterology;  Laterality: N/A;  . HERNIA REPAIR Right remotely as a baby  . POLYPECTOMY  01/27/2019   Procedure: POLYPECTOMY;  Surgeon: William Whitaker, Telford Whitaker., MD;  Location: [facility];  Service: Gastroenterology;;    Allergies as of 09/29/2020   No Known Allergies     Medication List       Accurate as of September 29, 2020  4:40 PM. If you have any questions, ask your nurse or doctor.        STOP taking these medications   aspirin EC 81 MG tablet Stopped by: Kathlene November, MD     TAKE these medications   albuterol 108 (90 Base) MCG/ACT inhaler Commonly known as: VENTOLIN HFA Inhale 2 puffs into the lungs every 6 (six) hours as needed for wheezing or shortness of breath.   Atrovent HFA 17 MCG/ACT inhaler Generic drug: ipratropium Inhale 2  puffs into the lungs every 4 (four) hours as needed for wheezing.   ferrous sulfate 325 (65 FE) MG tablet Take 1 tablet (325 mg total) by mouth daily with breakfast.   furosemide 20 MG tablet Commonly known as: LASIX Take 1 tablet (20 mg total) by mouth daily.   hydrOXYzine 10 MG tablet Commonly known as: ATARAX/VISTARIL Take 1 tablet (10 mg total) by mouth 3 (three) times daily as needed for anxiety. What changed: reasons to take this Changed by: Kathlene November, MD   ipratropium 0.02 % nebulizer solution Commonly known as: ATROVENT Take 2.5 mLs (0.5 mg total) by nebulization 4 (four) times daily.   omeprazole 20 MG capsule Commonly known as: PRILOSEC Take 1 capsule (20 mg total) by mouth daily.   One-A-Day Mens VitaCraves Chew Chew 1 tablet by mouth daily.   potassium chloride SA 20 MEQ tablet Commonly known as: Klor-Con M20 Take 1 tablet (20 mEq total) by mouth daily.   propranolol 20 MG tablet Commonly known as: INDERAL Take 1 tablet (20 mg total) by mouth 2 (two) times daily.   Spiriva Respimat 2.5 MCG/ACT Aers Generic drug: Tiotropium Bromide Monohydrate Inhale 2.5 mcg into the lungs daily. 2 puffs daily - through [employer] PAP   spironolactone 50 MG tablet Commonly known as: ALDACTONE Take 1 tablet (50 mg total) by mouth daily.   topiramate 25 MG tablet Commonly known as: TOPAMAX Take 1 tablet (25 mg total) by mouth daily.   vitamin C  250 MG tablet Commonly known as: ASCORBIC ACID Take 250 mg by mouth daily.   Vitamin D3 50 MCG (2000 UT) Tabs Take 1 tablet by mouth daily.   VITAMIN E COMPLETE PO Take by mouth.          Objective:   Physical Exam BP (!) 144/70 (BP Location: Left Arm, Patient Position: Sitting, Cuff Size: Normal)   Pulse 78   Temp 97.8 F (36.6 C) (Oral)   Resp 18   Ht 6\' 1"  (1.854 m)   Wt 203 lb 6 oz (92.3 kg)   SpO2 97%   BMI 26.83 kg/m  General:   Well developed, NAD, BMI noted.  HEENT:  Normocephalic . Face symmetric,  atraumatic Lungs:  CTA B Normal respiratory effort, no intercostal retractions, no accessory muscle use. Heart: RRR,  no murmur.  Abdomen:  Not distended, soft, non-tender. No rebound or rigidity.  + Hepatomegaly, not tender or nodular Skin: Not pale. Not jaundice Lower extremities: no pretibial edema bilaterally  Neurologic:  alert & oriented X3.  Speech normal, gait appropriate for age and unassisted Psych--  Cognition and judgment appear intact.  Cooperative with normal attention span and concentration.  Behavior appropriate. No anxious or depressed appearing.     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 Tobacco abuse Anemia, severe.  PLAN COPD: Minimal symptoms at this point, continue present care, RFs sent Anxiety, insomnia: Reports he feels jittery and hyper during the daytime, Atarax helps, RF sent Severe anemia: Since the last visit 02/2020, saw hematology, felt anemia was secondary to malabsorption & possibly intermittent GI loss, on oral iron supplements, has not received iron IV.  Check CBC iron panel Cirrhosis: Saw GI 06/01/2020 for cirrhosis, felt to be stable.  Considering EGD if has ongoing anemia.  Currently on Lasix, potassium, Inderal, Aldactone.  Check CMP. Mild hyperglycemia Per chart review, check A1c and TSH. Tobacco: Not ready to quit Preventive care discussed:  Rec to schedule a  CPX in 4 months Immunizations: Tdap, Shingrix, pnm 23, Covid VAX booster: Benefits, pros/cons discussed, declines any shot at this point but will think about it. Tubular adenomas: Next colonoscopy 2023 RTC 4 months  This visit occurred during the SARS-CoV-2 public health emergency.  Safety protocols were in place, including screening questions prior to the visit, additional usage of staff PPE, and extensive cleaning of exam room  while observing appropriate contact time as indicated for disinfecting solutions.

## 2020-09-29 NOTE — Assessment & Plan Note (Signed)
COPD: Minimal symptoms at this point, continue present care, RFs sent Anxiety, insomnia: Reports he feels jittery and hyper during the daytime, Atarax helps, RF sent Severe anemia: Since the last visit 02/2020, saw hematology, felt anemia was secondary to malabsorption & possibly intermittent GI loss, on oral iron supplements, has not received iron IV.  Check CBC iron panel Cirrhosis: Saw GI 06/01/2020 for cirrhosis, felt to be stable.  Considering EGD if has ongoing anemia.  Currently on Lasix, potassium, Inderal, Aldactone.  Check CMP. Mild hyperglycemia Per chart review, check A1c and TSH. Tobacco: Not ready to quit Preventive care discussed:  Rec to schedule a  CPX in 4 months Immunizations: Tdap, Shingrix, pnm 23, Covid VAX booster: Benefits, pros/cons discussed, declines any shot at this point but will think about it. Tubular adenomas: Next colonoscopy 2023 RTC 4 months

## 2020-09-29 NOTE — Patient Instructions (Addendum)
Please consider taking the following immunizations at your pharmacy:  Tdap (tetanus shot) Pneumonia shot (Pneumovax) Shingrix A COVID booster    GO TO THE LAB : Get the blood work     [place], [place] back for   a physical exam in 4 months

## 2020-09-30 LAB — CBC WITH DIFFERENTIAL/PLATELET
Basophils Absolute: 0 10*3/uL (ref 0.0–0.1)
Basophils Relative: 0.7 % (ref 0.0–3.0)
Eosinophils Absolute: 0.1 10*3/uL (ref 0.0–0.7)
Eosinophils Relative: 1.6 % (ref 0.0–5.0)
HCT: 42.8 % (ref 39.0–52.0)
Hemoglobin: 14.4 g/dL (ref 13.0–17.0)
Lymphocytes Relative: 26.7 % (ref 12.0–46.0)
Lymphs Abs: 1.8 10*3/uL (ref 0.7–4.0)
MCHC: 33.7 g/dL (ref 30.0–36.0)
MCV: 92.9 fl (ref 78.0–100.0)
Monocytes Absolute: 0.7 10*3/uL (ref 0.1–1.0)
Monocytes Relative: 11.2 % (ref 3.0–12.0)
Neutro Abs: 3.9 10*3/uL (ref 1.4–7.7)
Neutrophils Relative %: 59.8 % (ref 43.0–77.0)
Platelets: 143 10*3/uL — ABNORMAL LOW (ref 150.0–400.0)
RBC: 4.61 Mil/uL (ref 4.22–5.81)
RDW: 15.2 % (ref 11.5–15.5)
WBC: 6.6 10*3/uL (ref 4.0–10.5)

## 2020-09-30 LAB — COMPREHENSIVE METABOLIC PANEL
ALT: 13 U/L (ref 0–53)
AST: 23 U/L (ref 0–37)
Albumin: 4.4 g/dL (ref 3.5–5.2)
Alkaline Phosphatase: 82 U/L (ref 39–117)
BUN: 11 mg/dL (ref 6–23)
CO2: 27 mEq/L (ref 19–32)
Calcium: 9.2 mg/dL (ref 8.4–10.5)
Chloride: 99 mEq/L (ref 96–112)
Creatinine, Ser: 0.78 mg/dL (ref 0.40–1.50)
GFR: 96.63 mL/min (ref >60.00)
Glucose, Bld: 102 mg/dL — ABNORMAL HIGH (ref 70–99)
Potassium: 4.5 mEq/L (ref 3.5–5.1)
Sodium: 133 mEq/L — ABNORMAL LOW (ref 135–145)
Total Bilirubin: 0.7 mg/dL (ref 0.2–1.2)
Total Protein: 8 g/dL (ref 6.0–8.3)

## 2020-09-30 LAB — IBC + FERRITIN
Ferritin: 18.5 ng/mL — ABNORMAL LOW (ref 22.0–322.0)
Iron: 192 ug/dL — ABNORMAL HIGH (ref 42–165)
Saturation Ratios: 38.9 % (ref 20.0–50.0)
Transferrin: 353 mg/dL (ref 212.0–360.0)

## 2020-09-30 LAB — HEMOGLOBIN A1C: Hgb A1c MFr Bld: 5.3 % (ref 4.6–6.5)

## 2020-09-30 LAB — TSH: TSH: 1.46 u[IU]/mL (ref 0.35–4.50)

## 2020-10-21 ENCOUNTER — Telehealth: Payer: Self-pay

## 2020-10-21 NOTE — Chronic Care Management (AMB) (Signed)
    Chronic Care Management Pharmacy Assistant   Name: William Whitaker  MRN: [id number] DOB: May 08, 1961  Reason for Encounter: Medication Review-Patient Assistance follow up    Recent office visits:  09/29/20-Jose Larose Kells, MD(PCP)  Recent consult visits:  06/01/20-Vito Cirigliano, DO(Gastroenterology)  Hospital visits:  None in previous 6 months  Medications: Outpatient Encounter Medications as of 10/21/2020  Medication Sig  . albuterol (VENTOLIN HFA) 108 (90 Base) MCG/ACT inhaler Inhale 2 puffs into the lungs every 6 (six) hours as needed for wheezing or shortness of breath.  . Cholecalciferol (VITAMIN D3) 50 MCG (2000 UT) TABS Take 1 tablet by mouth daily.  . ferrous sulfate 325 (65 FE) MG tablet Take 1 tablet (325 mg total) by mouth daily with breakfast.  . furosemide (LASIX) 20 MG tablet Take 1 tablet (20 mg total) by mouth daily.  . hydrOXYzine (ATARAX/VISTARIL) 10 MG tablet Take 1 tablet (10 mg total) by mouth 3 (three) times daily as needed for anxiety.  Marland Kitchen ipratropium (ATROVENT HFA) 17 MCG/ACT inhaler Inhale 2 puffs into the lungs every 4 (four) hours as needed for wheezing.  Marland Kitchen ipratropium (ATROVENT) 0.02 % nebulizer solution Take 2.5 mLs (0.5 mg total) by nebulization 4 (four) times daily.  . Multiple Vitamins-Minerals (ONE-A-DAY MENS VITACRAVES) CHEW Chew 1 tablet by mouth daily.  Marland Kitchen omeprazole (PRILOSEC) 20 MG capsule Take 1 capsule (20 mg total) by mouth daily.  . potassium chloride SA (KLOR-CON M20) 20 MEQ tablet Take 1 tablet (20 mEq total) by mouth daily.  . propranolol (INDERAL) 20 MG tablet Take 1 tablet (20 mg total) by mouth 2 (two) times daily.  Marland Kitchen spironolactone (ALDACTONE) 50 MG tablet Take 1 tablet (50 mg total) by mouth daily.  . Tiotropium Bromide Monohydrate (SPIRIVA RESPIMAT) 2.5 MCG/ACT AERS Inhale 2.5 mcg into the lungs daily. 2 puffs daily - through [employer] PAP  . topiramate (TOPAMAX) 25 MG tablet Take 1 tablet (25 mg total) by mouth daily.  . vitamin C (ASCORBIC  ACID) 250 MG tablet Take 250 mg by mouth daily.  . Vitamin Mixture (VITAMIN E COMPLETE PO) Take by mouth.   No facility-administered encounter medications on file as of 10/21/2020.     Called patient to check if he had completed patient assistance application. Patient states he has not received the application in the mail. Verified address in chart.  Completed and mailed new application. Patient aware to return to office once completed.  Lizbeth Bark Clinical Pharmacist Assistant [phone]

## 2020-10-22 NOTE — Telephone Encounter (Signed)
Reviewed notes from CPA regarding patient assistance.  Total time spent with chart review and coordination = 3 minutes.   Cherre Robins, PharmD Clinical Pharmacist [facility]

## 2020-10-23 ENCOUNTER — Other Ambulatory Visit: Payer: Self-pay | Admitting: Internal Medicine

## 2020-10-27 ENCOUNTER — Other Ambulatory Visit: Payer: Self-pay | Admitting: Internal Medicine

## 2020-12-22 ENCOUNTER — Ambulatory Visit (INDEPENDENT_AMBULATORY_CARE_PROVIDER_SITE_OTHER): Payer: Medicare Other | Admitting: Pharmacist

## 2020-12-22 DIAGNOSIS — D509 Iron deficiency anemia, unspecified: Secondary | ICD-10-CM | POA: Diagnosis not present

## 2020-12-22 DIAGNOSIS — F419 Anxiety disorder, unspecified: Secondary | ICD-10-CM

## 2020-12-22 DIAGNOSIS — J449 Chronic obstructive pulmonary disease, unspecified: Secondary | ICD-10-CM | POA: Diagnosis not present

## 2020-12-22 DIAGNOSIS — F17200 Nicotine dependence, unspecified, uncomplicated: Secondary | ICD-10-CM

## 2020-12-22 NOTE — Patient Instructions (Signed)
Visit Information  PATIENT GOALS:  Goals Addressed             This Visit's Progress    Chronic Care Management Pharmacy Care Plan       CARE PLAN ENTRY (see longitudinal plan of care for additional care plan information)  Current Barriers:  Chronic Disease Management support, education, and care coordination needs related to COPD/Tobacco Use Disorder, Cirrhosis, Hep C, Anxiety   COPD/Tobacco Use Disorder Pharmacist Clinical Goal(s) Over the next 90 days, patient will work with PharmD and providers to create an affordable treatment plan for his inhaler regimen Over the next 90 days, patient will decrease/eliminate his use of cigarettes Current regimen:  Spiriva Respimat 2.67mcg 2 puffs daily Albuterol inhaler - 2 puffs every 6 hours as needed Ipratropium 0.02% nebulized solution or inhaler up to 4 times a day as needed Interventions:  Completed [employer] patient assistance application, signed by PCP and faxed to [employer] Patient self care activities - Over the next 90 days, patient will: Remain adherent to inhaler regimen Decrease cigarette smoking by at least 1-2 cigarettes per day Notify office once you receive Spiriva delivery  Anxiety Pharmacist Clinical Goal(s) Over the next 90 days, patient will work with PharmD and providers to reduce symptoms/limitations of anxiety Current regimen:  Hydroxyzine 10mg  3 times a day as needed for anxiety or sleep.  Patient self care activities - Over the next 90 days, patient will: Continue hydroxyzine for anxiety  Medication management Pharmacist Clinical Goal(s): Over the next 90 days, patient will work with PharmD and providers to maintain optimal medication adherence [facility] Interventions Comprehensive medication review performed. Continue current medication management strategy Patient self care activities - Over the next 90 days, patient will: Focus on medication adherence by filling medications  appropriately Take medications as prescribed Report any questions or concerns to PharmD and/or provider(s)  Please see past updates related to this goal by clicking on the "Past Updates" button in the selected goal           The patient verbalized understanding of instructions, educational materials, and care plan provided today and declined offer to receive copy of patient instructions, educational materials, and care plan.   Telephone follow up appointment with care management team member scheduled for: 3 months  Cherre Robins, PharmD Clinical Pharmacist [place] [facility]

## 2020-12-22 NOTE — Chronic Care Management (AMB) (Signed)
Chronic Care Management Pharmacy Note  12/22/2020 Name:  William Whitaker MRN:  [id number] DOB:  Dec 24, 1960  Subjective: William Whitaker is an 60 y.o. year old male who is a primary patient of Paz, Alda Berthold, MD.  The CCM team was consulted for assistance with disease management and care coordination needs.    Collaboration with provider, patient and [employer]  for  completion and submission of PCP application for Spiriva  in response to provider referral for pharmacy case management and/or care coordination services.   Consent to Services:  The patient was given information about Chronic Care Management services, agreed to services, and gave verbal consent prior to initiation of services.  Please see initial visit note for detailed documentation.   Patient Care Team: Colon Branch, MD as PCP - General (Internal Medicine) Cherre Robins, PharmD (Pharmacist)  Recent office visits: 09/29/20-Jose Larose Kells, MD(PCP) F/U chronic conditions. Aspirin removed for med list (patient not taking); Checked labs - CMP, A1c, CBC, ferritin, TSH.  Recent consult visits: None in last 6 months  Hospital visits: None in previous 6 months  Objective:  Lab Results  Component Value Date   CREATININE 0.78 09/29/2020   CREATININE 0.77 03/05/2020   CREATININE 0.53 02/05/2019    Lab Results  Component Value Date   HGBA1C 5.3 09/29/2020   Last diabetic Eye exam: No results found for: HMDIABEYEEXA  Last diabetic Foot exam: No results found for: HMDIABFOOTEX   No results found for: CHOL, TRIG, HDL, CHOLHDL, VLDL, LDLCALC, LDLDIRECT  Hepatic Function Latest Ref Rng & Units 09/29/2020 03/05/2020 02/05/2019  Total Protein 6.0 - 8.3 g/dL 8.0 7.8 7.3  Albumin 3.5 - 5.2 g/dL 4.4 4.1 3.3(L)  AST 0 - 37 U/L 23 36 43(H)  ALT 0 - 53 U/L _0 Alk Phosphatase 39 - 117 U/L 82 62 98  Total Bilirubin 0.2 - 1.2 mg/dL 0.7 0.8 1.1    Lab Results  Component Value Date/Time   TSH 1.46 09/29/2020 02:59 PM   TSH  3.002 01/25/2019 05:44 AM    CBC Latest Ref Rng & Units 09/29/2020 03/05/2020 02/10/2020  WBC 4.0 - 10.5 K/uL 6.6 4.7 4.9  Hemoglobin 13.0 - 17.0 g/dL 14.4 9.0(L) 7.5 Repeated and verified X2.(LL)  Hematocrit 39.0 - 52.0 % 42.8 32.4(L) 26.1 Repeated and verified X2.(L)  Platelets 150.0 - 400.0 K/uL 143.0(L) 202 151.0    No results found for: VD25OH  Clinical ASCVD: No  The ASCVD Risk score Mikey Bussing DC Jr., et al., 2013) failed to calculate for the following reasons:   Cannot find a previous HDL lab   Cannot find a previous total cholesterol lab     Social History   Tobacco Use  Smoking Status Every Day   Packs/day: 0.50   Years: 25.00   Pack years: 12.50   Types: Cigarettes  Smokeless Tobacco Never  Tobacco Comments   3 PPD, started to decrease to 1/2 ppd 2019, 2021 now smoking 1/4 ppd   BP Readings from Last 3 Encounters:  09/29/20 (!) 144/70  06/01/20 138/74  03/05/20 133/90   Pulse Readings from Last 3 Encounters:  09/29/20 78  06/01/20 80  03/05/20 75   Wt Readings from Last 3 Encounters:  09/29/20 203 lb 6 oz (92.3 kg)  06/01/20 199 lb 4 oz (90.4 kg)  03/05/20 193 lb 12.8 oz (87.9 kg)    Assessment: Review of patient past medical history, allergies, medications, health status, including review of consultants reports,  laboratory and other test data, was performed as part of comprehensive evaluation and provision of chronic care management services.   SDOH:  (Social Determinants of Health) assessments and interventions performed:  SDOH Interventions    Flowsheet Row Most Recent Value  SDOH Interventions   Financial Strain Interventions Other (Comment)  [Applying for assistace thru [place] for [place]  No Known Allergies  Medications Reviewed Today     Reviewed by Cherre Robins, PharmD (Pharmacist) on 12/22/20 at 40  Med List Status: <None>   Medication Order Taking? Sig Documenting Provider Last Dose Status Informant   albuterol (VENTOLIN HFA) 108 (90 Base) MCG/ACT inhaler [id number] Yes Inhale 2 puffs into the lungs every 6 (six) hours as needed for wheezing or shortness of breath. Colon Branch, MD Taking Active   Cholecalciferol (VITAMIN D3) 50 MCG (2000 UT) TABS [id number] Yes Take 1 tablet by mouth daily. [provider] Taking Active   ferrous sulfate 325 (65 FE) MG tablet [id number] Yes Take 1 tablet (325 mg total) by mouth daily with breakfast. Colon Branch, MD Taking Active   furosemide (LASIX) 20 MG tablet [id number] Yes Take 1 tablet (20 mg total) by mouth daily. Colon Branch, MD Taking Active   hydrOXYzine (ATARAX/VISTARIL) 10 MG tablet [id number] Yes Take 1 tablet (10 mg total) by mouth 3 (three) times daily as needed for anxiety. Colon Branch, MD Taking Active   ipratropium (ATROVENT HFA) 17 MCG/ACT inhaler [id number] Yes Inhale 2 puffs into the lungs every 4 (four) hours as needed for wheezing. Colon Branch, MD Taking Active   ipratropium (ATROVENT) 0.02 % nebulizer solution [id number] Yes Take 2.5 mLs (0.5 mg total) by nebulization 4 (four) times daily. Colon Branch, MD Taking Active   Multiple Vitamins-Minerals (ONE-A-DAY MENS VITACRAVES) CHEW [id number] Yes Chew 1 tablet by mouth daily. [provider] Taking Active Self  omeprazole (PRILOSEC) 20 MG capsule [id number] Yes Take 1 capsule (20 mg total) by mouth daily. Colon Branch, MD Taking Active   potassium chloride SA (KLOR-CON M20) 20 MEQ tablet [id number] Yes Take 1 tablet (20 mEq total) by mouth daily. Colon Branch, MD Taking Active   propranolol (INDERAL) 20 MG tablet [id number] Yes Take 1 tablet (20 mg total) by mouth 2 (two) times daily. Colon Branch, MD Taking Active   spironolactone (ALDACTONE) 50 MG tablet [id number] Yes Take 1 tablet (50 mg total) by mouth daily. Colon Branch, MD Taking Active   Tiotropium Bromide Monohydrate (SPIRIVA RESPIMAT) 2.5 MCG/ACT AERS [id number] No Inhale 2.5 mcg into the lungs daily. 2 puffs daily - through [employer]  PAP  Patient not taking: Reported on 12/22/2020   [provider] Not Taking Active   topiramate (TOPAMAX) 25 MG tablet [id number] Yes Take 1 tablet (25 mg total) by mouth daily. Colon Branch, MD Taking Active   vitamin C (ASCORBIC ACID) 250 MG tablet [id number] Yes Take 250 mg by mouth daily. [provider] Taking Active Self  Vitamin Mixture (VITAMIN E COMPLETE PO) [id number] Yes Take by mouth. [provider] Taking Active             Patient Active Problem List   Diagnosis Date Noted   Insomnia 09/29/2020   COPD (chronic obstructive pulmonary disease) ([place]) 04/09/2019   PCP NOTES >>>>>>>>>>>>>>>>>> 02/05/2019   Chronic hepatitis ([place]) 02/05/2019   EtOH dependence ([place]) 01/25/2019   Tobacco dependence 81/15/7262   Alcoholic  cirrhosis of liver with ascites ([place]) 01/25/2019   Hyponatremia 01/25/2019   Iron deficiency anemia    Symptomatic anemia 01/24/2019   Anxiety 08/20/2007   REACTIVE AIRWAY DISEASE 08/20/2007   G E R D 08/20/2007    Immunization History  Administered Date(s) Administered   Hep A / Hep B 06/30/2019   Hepatitis A, Adult 04/21/2019   Hepatitis B, ped/adol 03/11/2019   Influenza Inj Mdck Quad Pf 04/10/2019   Janssen (J&J) SARS-COV-2 Vaccination 02/04/2020    Conditions to be addressed/monitored: COPD, Anxiety, and tobacco use disorder; cirrhosis;   Care Plan : General Pharmacy (Adult)  Updates made by Cherre Robins, PHARMD since 12/22/2020 12:00 AM     Problem: COPD, Anxiety, Cirrhosis   Priority: High  Onset Date: 08/19/2020     Long-Range Goal: Patient-Specific Goal   Start Date: 08/19/2020  Expected End Date: 02/16/2021  Recent Progress: On track  Priority: High  Note:   Current Barriers:  Unable to independently afford treatment regimen Not taking Spiriva (maintenance inhaler) daily due to cost  Pharmacist Clinical Goal(s):  Over the next 120 days, patient will verbalize ability to afford treatment regimen achieve  adherence to monitoring guidelines and medication adherence to achieve therapeutic efficacy maintain control of COPD as evidenced by exacerbation frequency  through collaboration with PharmD and provider.   Interventions: 1:1 collaboration with Colon Branch, MD regarding development and update of comprehensive plan of care as evidenced by provider attestation and co-signature Inter-disciplinary care team collaboration (see longitudinal plan of care) Comprehensive medication review performed; medication list updated in electronic medical record  COPD/Tobacco (Goal: control symptoms and prevent exacerbations) Controlled / no recent COPD exacerbations in last year that required treatment Current COPD Classification:  B (high sx, <2 exacerbations/yr) Current therapy:  Alubterol HFA - inhale 2 puffs up to every 6 hours as needed Atrovent (ipratropium) HFA - inhaler 2 puffs every 4 hours as needed Atorvent (ipratropium) nebules - use 1 ampule / nebule in nebulizer 4 times a day as needed Spiriva respimat inhaler - 2 puffs daily Cost of medicaitons for COPD has been barrier to adherence. With [place] his cost was $148 for Spriva. Yupelri cost the patient over $200 after insurance.  Interventions:  Patient has completed his portion of [place] application to get Spiriva thru PAP. Completed provider portion. Dr Larose Kells reviewed and signed. Application was signed in office today and faxed to [facility].  Will have CMA with pharmacy team check on progress of application in 7 to 10 days.   Tobacco Use: Continues to smoke 1/2 PPD Paitent reports he is not ready to quit smoking at this time.  First cigarette occurs in the afternoon.  Has tried patches before and tapering number of cigarettes per day - not successful Current treatment  none Intervention:  Addressed at previous visits Will continue to assess patients readiness for smoking cessation  Depression/Anxiety (Goal: Minimize  symptoms) Improving Current treatment: Hydroxyzine 27m 3 times a day as needed Medications previously tried/failed: none noted Reports he takes prn for sleep or if he needs to during the day. Interventions:  None today - has been addressed at previous visits Recommended to continue current medication  Cirrhosis/Alcohol use disorder (Goal: Maintain) Controlled Managed by GI - Dr CBryan LemmaCurrent treatment  Furosemide 267mdaily Potassium 2037mdaily Omeprazole 64m43mily Propranolol 64mg49mce daily Spironolactone 50mg 40my Topiramate 25mg d87m Medications previously tried: none noted  Intervention:  Recommended to continue current medication  Anemia:  Last H/H was 14.4 / 42.8 (improved) Last Ferritin was 18.5 (low but improved) Current therapy:  Ferrous sulfate 34m daily with breakfast Interventions:  Continue current therapy  Patient Goals/Self-Care Activities Over the next 120 days, patient will:  take medications as prescribed focus on medication adherence by pill count collaborate with provider on medication access solutions  Follow Up Plan: The care management team will reach out to the PAP program to confirm receipt and processing of PAP application; Clinical pharmacist follow up in 3 to 4 months.        Medication Assistance: Application for Spiriva / [place] medication assistance program. in process.  Anticipated assistance start date 01/20/2021.  See plan of care for additional detail.  Patient's [facility] pharmacy is:  [facility] [place] [id number] [address]Phone: [phone]: [phone] [id number] [place],  - [address]: [phone]: [phone] Follow Up:  Patient agrees to Care Plan and Follow-up.  Plan: Pharmacy team  CMA will check on PAP application in 7 to 10 days;  Clinical pharmacist will f/u with 3 months.   TaCherre RobinsPharmD Clinical Pharmacist [phone]

## 2021-01-02 IMAGING — DX CHEST - 2 VIEW
2 series · 2 of 2 positions shown · non-contrast
Comparison: 05/28/2016

Correlation: CT chest 05/28/2016, 01/31/2007

CLINICAL DATA: Shortness of breath

EXAM:
CHEST - 2 VIEW

[chest pa]
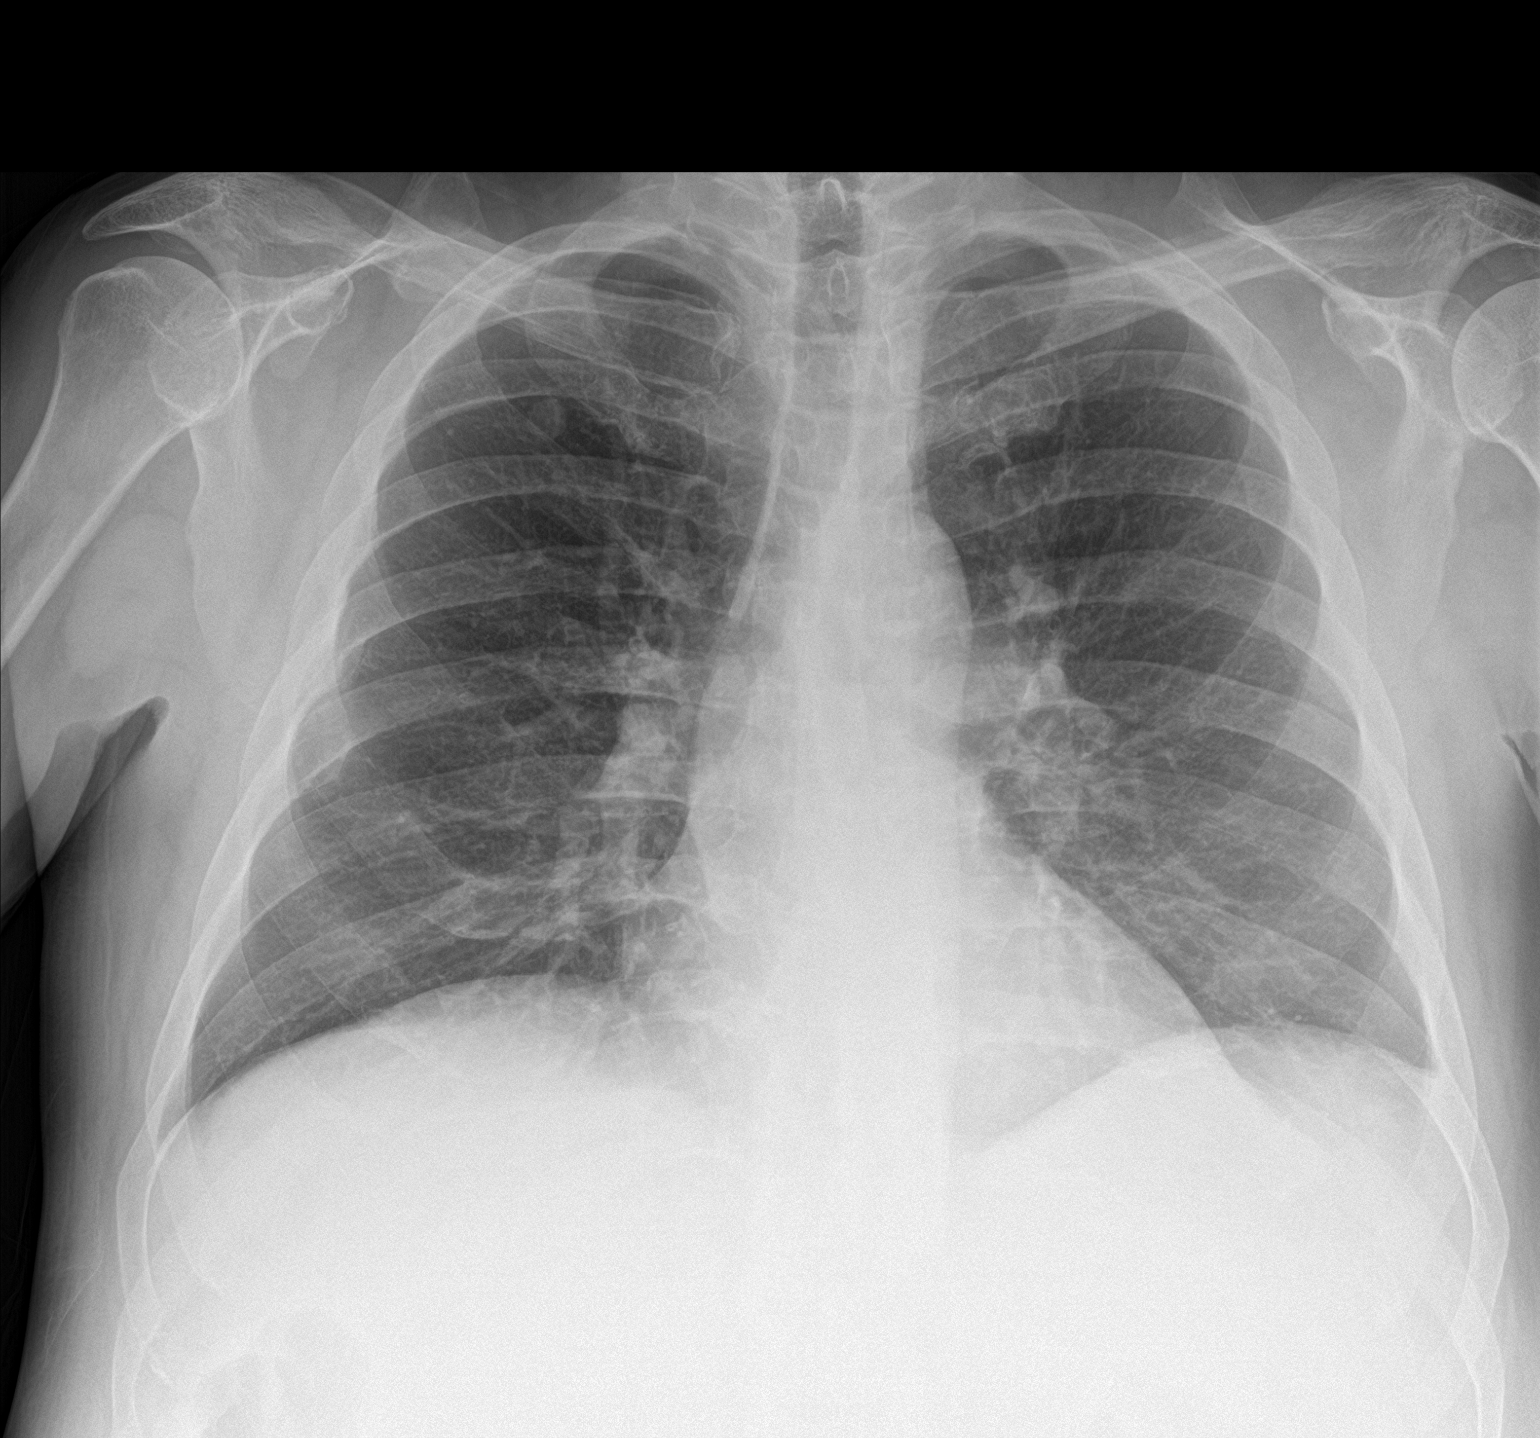

[chest lat]
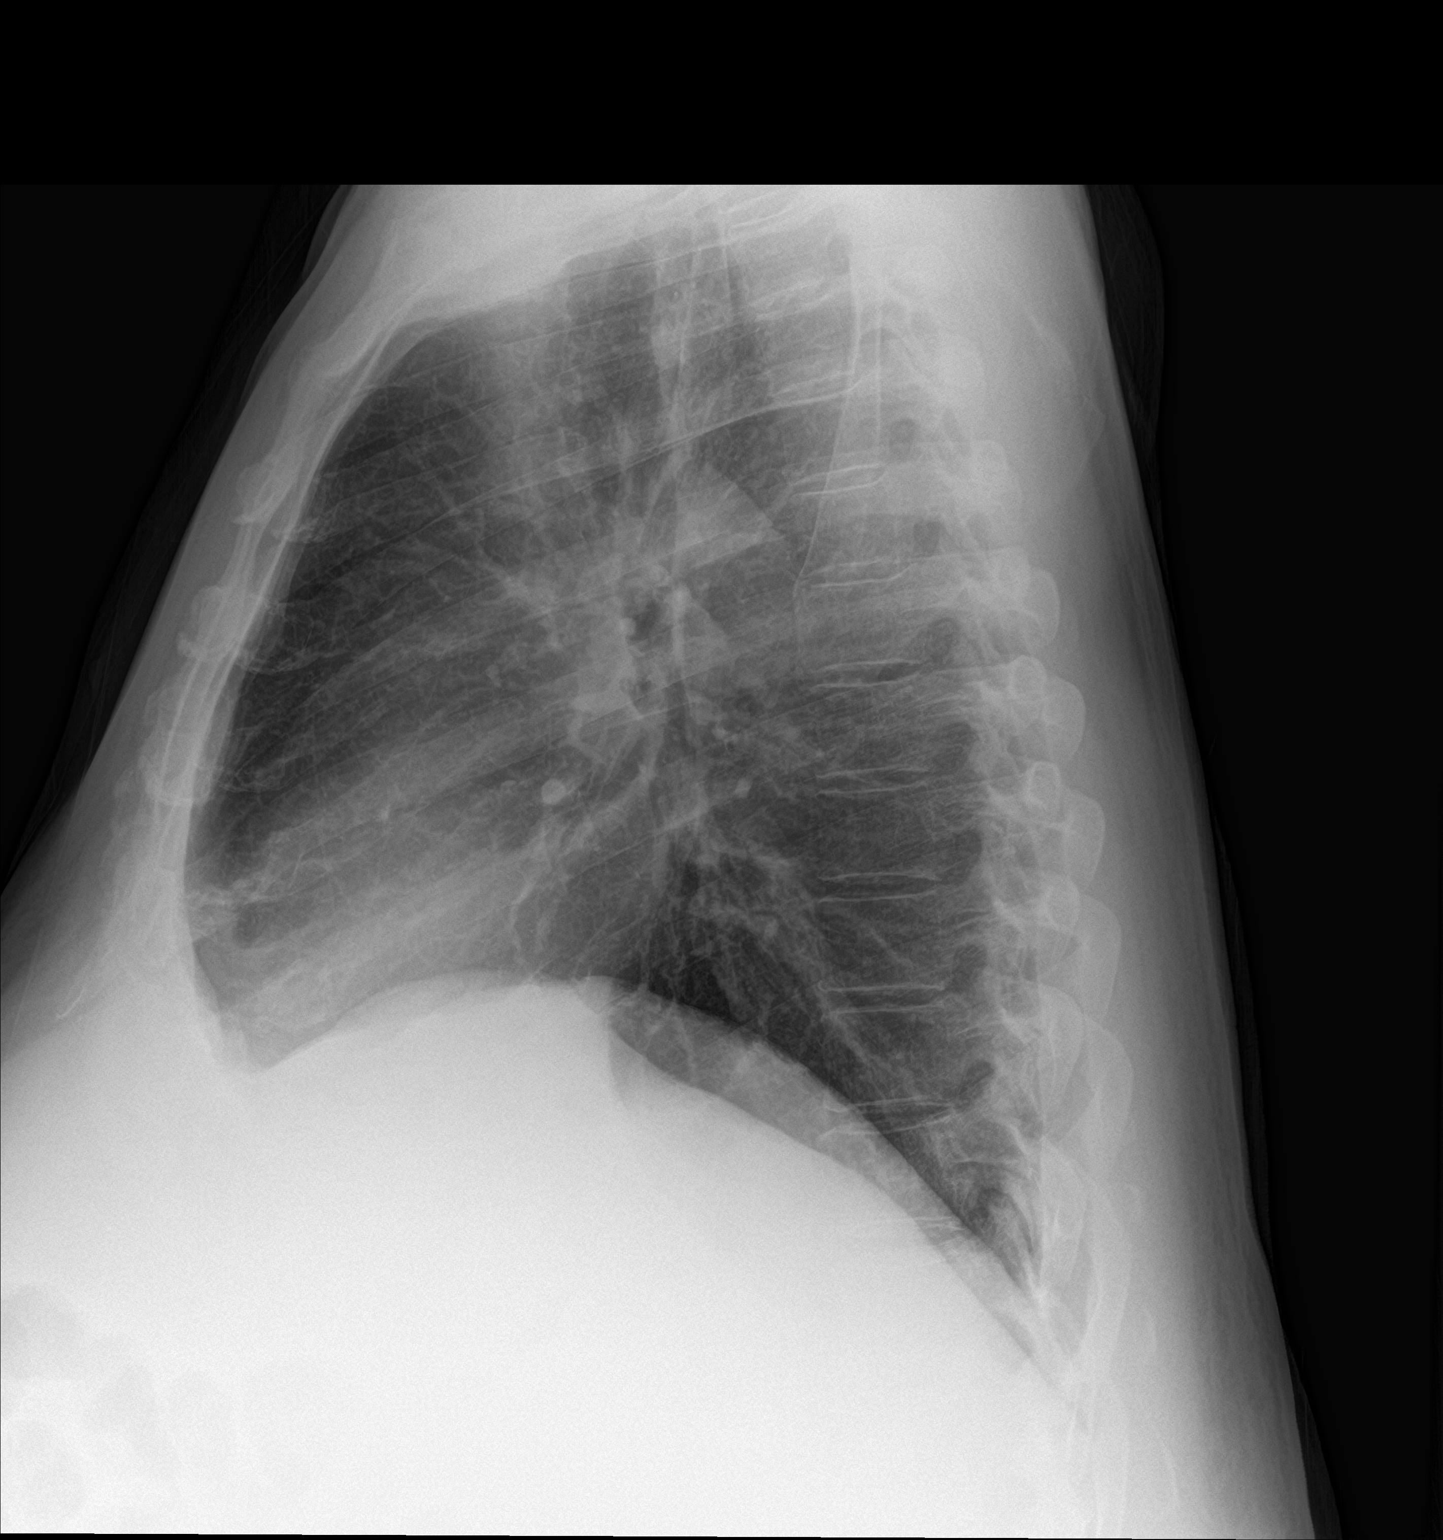

[2 of 2 positions shown; findings below may reference images not displayed]

FINDINGS: Normal heart size, mediastinal contours, and pulmonary vascularity.

Chronic nodular density RIGHT upper lobe 11 x 9 mm, previously 14 x
11 mm.

This corresponds to a noncalcified 10 x 9 mm diameter nodule on the
most recent prior CT, unchanged since 01/31/2007, indicative of
benign behavior.

Bronchitic changes without pulmonary infiltrate, pleural effusion or
pneumothorax.

No acute osseous findings.
IMPRESSION: Chronic bronchitic changes without infiltrate.

Chronic RIGHT upper lobe nodule grossly stable since CT exam of
01/31/2007.

## 2021-01-03 IMAGING — CT CT CHEST WITHOUT CONTRAST
2 of 3 series · 15 of 36 positions shown, 18 images · non-contrast
Comparison: CT of the chest abdomen and pelvis on 05/28/2016

CLINICAL DATA: Lung nodule.  Smoker.

EXAM:
CT CHEST WITHOUT CONTRAST
TECHNIQUE: Multidetector CT imaging of the chest was performed following the
standard protocol without IV contrast.

[Series 2: thorax · axial · 0.87mm/px · z∈[-367,-47]mm · 12 of 188 slices shown, 15 images]
[im 14/188  mediastinal]
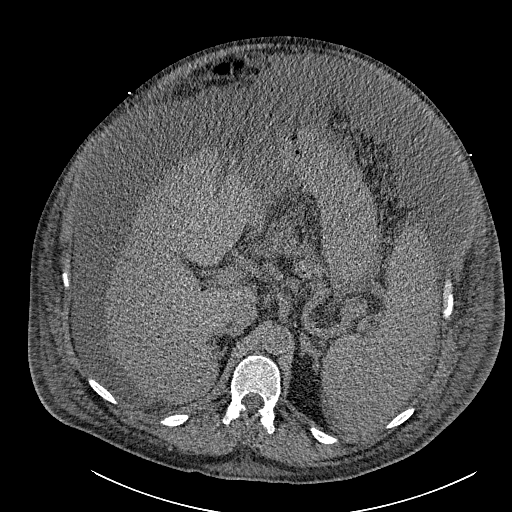
[im 14/188  lung]
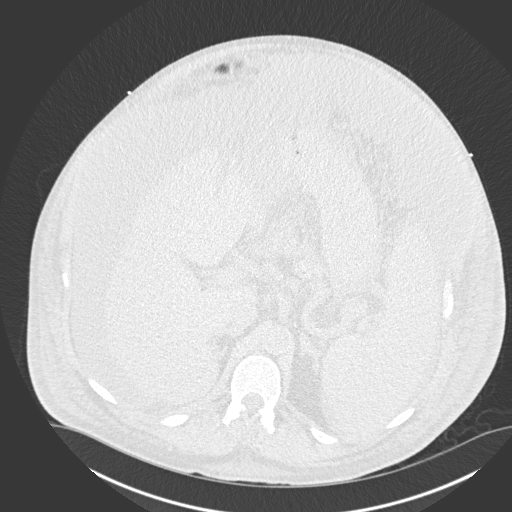
[im 28/188  lung]
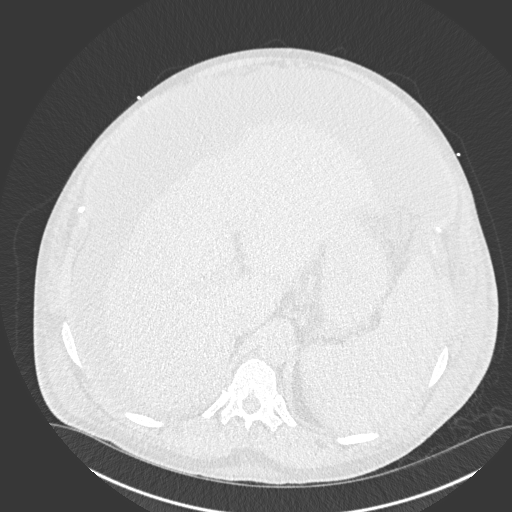
[im 42/188  lung]
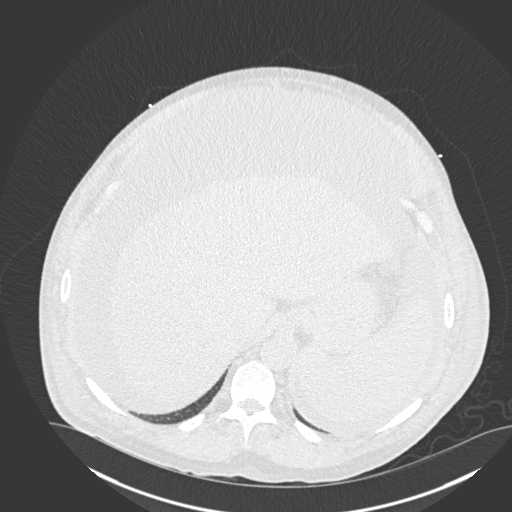
[im 56/188  lung]
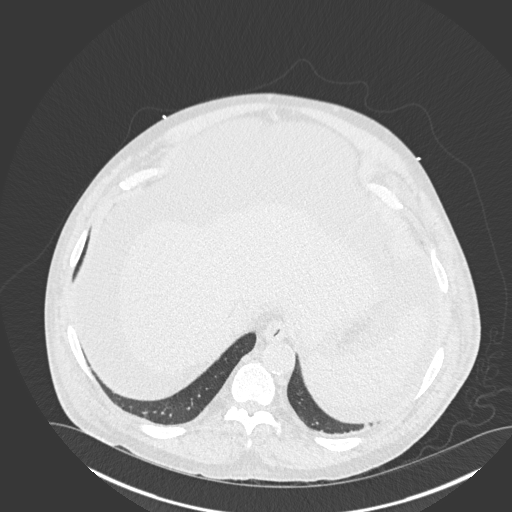
[im 70/188  mediastinal]
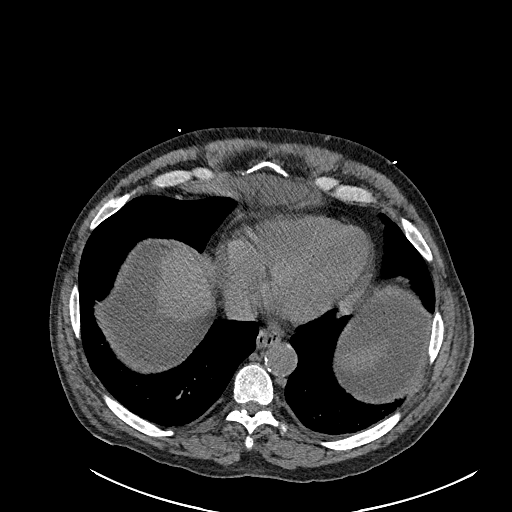
[im 70/188  lung]
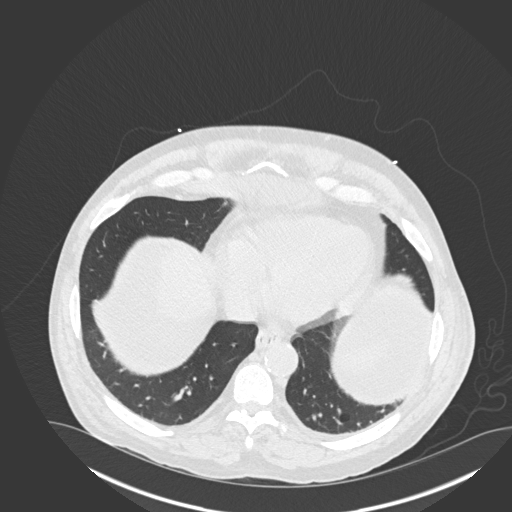
[im 84/188  lung]
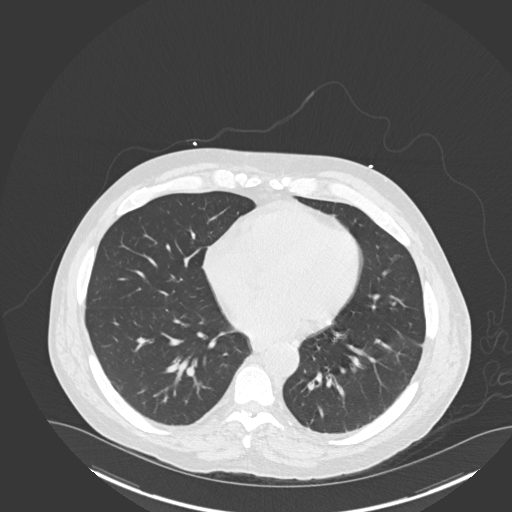
[im 104/188  lung]
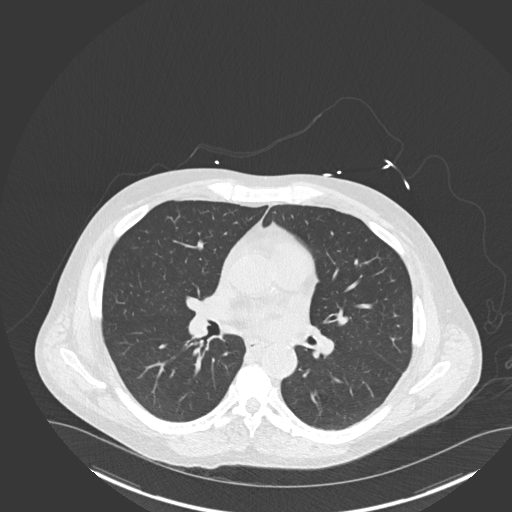
[im 118/188  lung]
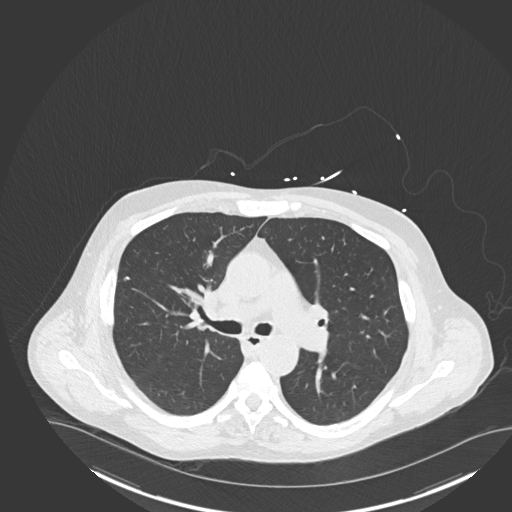
[im 132/188  mediastinal]
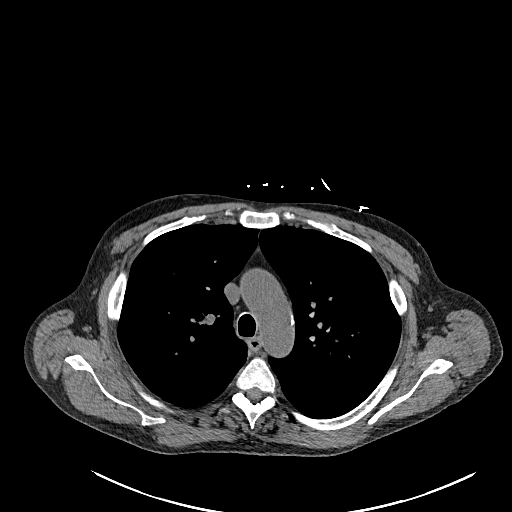
[im 132/188  lung]
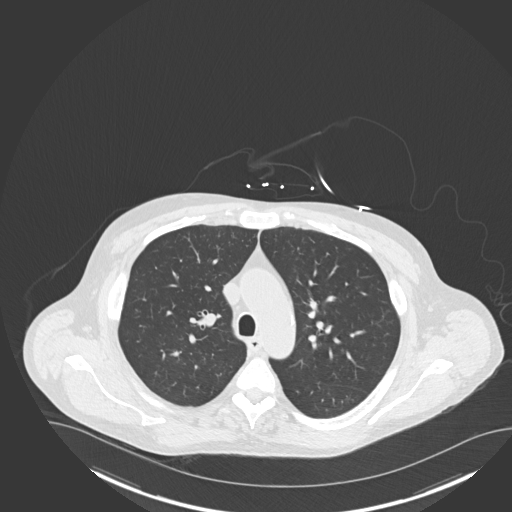
[im 146/188  lung]
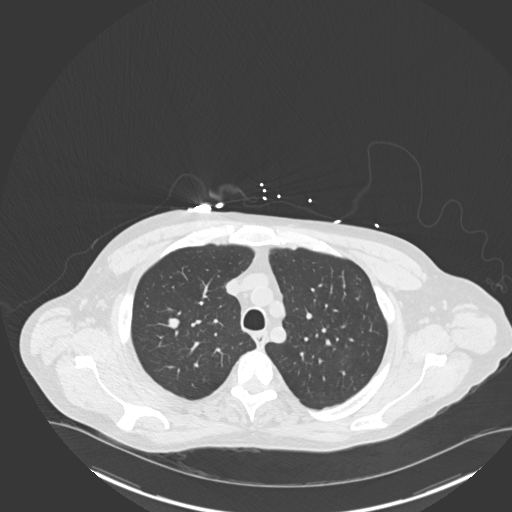
[im 160/188  lung]
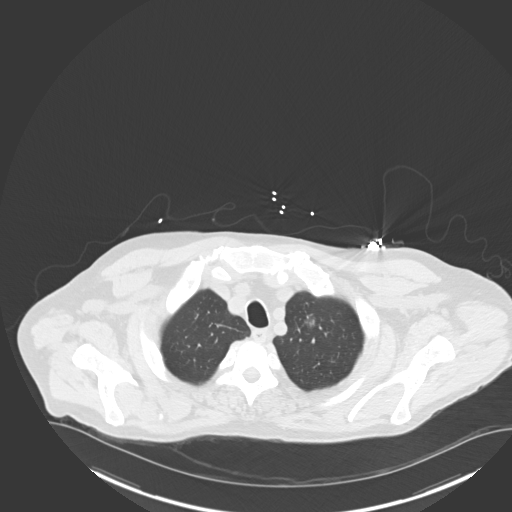
[im 174/188  lung]
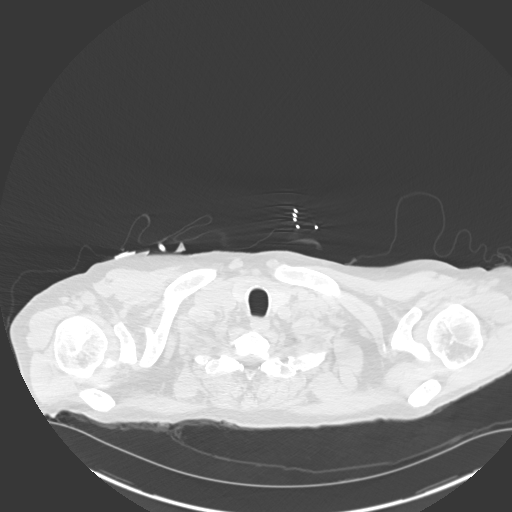

[Series 5: coronal · coronal · 0.73mm/px · 3 of 190 slices shown]
[im 38/190  lung]
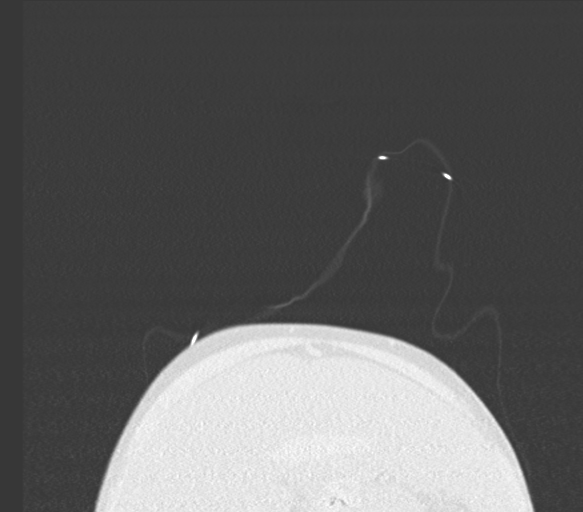
[im 76/190  lung]
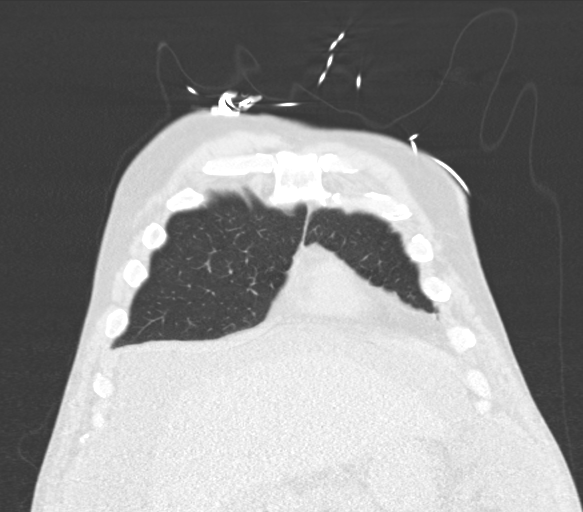
[im 114/190  lung]
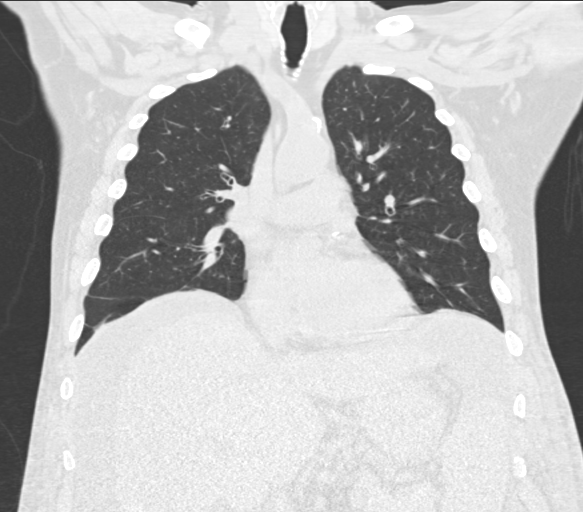

[15 of 36 positions shown; findings below may reference images not displayed]

FINDINGS: Cardiovascular: There is atherosclerotic calcification of the
coronary arteries. The heart is mildly enlarged. No pericardial
effusion. There is atherosclerosis of the thoracic aorta, not
associated with aneurysm. Noncontrast appearance of the pulmonary
arteries is unremarkable.

Mediastinum/Nodes: Normal appearance of the esophagus. The
visualized portion of the thyroid gland has a normal appearance. No
mediastinal, hilar, or axillary adenopathy.

Lungs/Pleura: Within the LEFT UPPER lobe there is a focal
centimeter ground-glass nodule, possibly associated with central
lucency (image [DATE]). Within the RIGHT UPPER lobe there is a
centimeter nodule containing central punctate calcification,
previously measuring 0.8 centimeters.A 3 millimeter nodule is
identified within the RIGHT UPPER lobe on image 53/7. There are
scattered calcified granulomata. No focal consolidations or pleural
effusions. No pulmonary edema. There is minimal scarring at the LEFT
lung base.

Upper Abdomen: There is a large volume of ascites in the UPPER
abdomen. The liver contour is nodular and rounded, consistent with
cirrhosis. Numerous varices are identified in the LEFT UPPER
QUADRANT.

Musculoskeletal: Remote RIGHT clavicle fracture. No suspicious lytic
or blastic lesions.
IMPRESSION: 1. Ground-glass nodule in the LEFT UPPER lobe, measuring 1.0 cm.
Consider follow-up CT of the chest without contrast in 6-12 months.
2. RIGHT UPPER lobe solid nodule contains punctate calcifications
centrally, favoring benign process now measuring 1.0 centimeters.
3. Smaller 3 millimeter nodule within the RIGHT UPPER lobe.
4. Evidence for prior granulomatous disease.
5. Coronary artery disease.
6. Remote RIGHT clavicle fracture.
7. Aortic Atherosclerosis ([id number]).
8. Large volume ascites in the UPPER abdomen.
9. Cirrhosis.

## 2021-01-04 ENCOUNTER — Telehealth: Payer: Self-pay | Admitting: Pharmacist

## 2021-01-04 NOTE — Chronic Care Management (AMB) (Signed)
    Chronic Care Management Pharmacy Assistant   Name: William Whitaker  MRN: [id number] DOB: 02/24/1961   Reason for Encounter: Patient assistance update     Medications: Outpatient Encounter Medications as of 01/04/2021  Medication Sig   albuterol (VENTOLIN HFA) 108 (90 Base) MCG/ACT inhaler Inhale 2 puffs into the lungs every 6 (six) hours as needed for wheezing or shortness of breath.   Cholecalciferol (VITAMIN D3) 50 MCG (2000 UT) TABS Take 1 tablet by mouth daily.   ferrous sulfate 325 (65 FE) MG tablet Take 1 tablet (325 mg total) by mouth daily with breakfast.   furosemide (LASIX) 20 MG tablet Take 1 tablet (20 mg total) by mouth daily.   hydrOXYzine (ATARAX/VISTARIL) 10 MG tablet Take 1 tablet (10 mg total) by mouth 3 (three) times daily as needed for anxiety.   ipratropium (ATROVENT HFA) 17 MCG/ACT inhaler Inhale 2 puffs into the lungs every 4 (four) hours as needed for wheezing.   ipratropium (ATROVENT) 0.02 % nebulizer solution Take 2.5 mLs (0.5 mg total) by nebulization 4 (four) times daily.   Multiple Vitamins-Minerals (ONE-A-DAY MENS VITACRAVES) CHEW Chew 1 tablet by mouth daily.   omeprazole (PRILOSEC) 20 MG capsule Take 1 capsule (20 mg total) by mouth daily.   potassium chloride SA (KLOR-CON M20) 20 MEQ tablet Take 1 tablet (20 mEq total) by mouth daily.   propranolol (INDERAL) 20 MG tablet Take 1 tablet (20 mg total) by mouth 2 (two) times daily.   spironolactone (ALDACTONE) 50 MG tablet Take 1 tablet (50 mg total) by mouth daily.   Tiotropium Bromide Monohydrate (SPIRIVA RESPIMAT) 2.5 MCG/ACT AERS Inhale 2.5 mcg into the lungs daily. 2 puffs daily - through [employer] PAP (Patient not taking: Reported on 12/22/2020)   topiramate (TOPAMAX) 25 MG tablet Take 1 tablet (25 mg total) by mouth daily.   vitamin C (ASCORBIC ACID) 250 MG tablet Take 250 mg by mouth daily.   Vitamin Mixture (VITAMIN E COMPLETE PO) Take by mouth.   No facility-administered encounter medications on file  as of 01/04/2021.    Called [employer] cares foundation regarding patients assistance application for Spiriva. Patient was denied due to being over income limit.  Lizbeth Bark Clinical Pharmacist Assistant [phone]

## 2021-01-05 NOTE — Telephone Encounter (Signed)
Patient called office to discuss med cost. Was going to documents here but was long conversation. See phone visit notes for 01/05/2021.

## 2021-01-06 ENCOUNTER — Ambulatory Visit (INDEPENDENT_AMBULATORY_CARE_PROVIDER_SITE_OTHER): Payer: Medicare Other | Admitting: Pharmacist

## 2021-01-06 ENCOUNTER — Telehealth: Payer: Self-pay | Admitting: Pharmacist

## 2021-01-06 DIAGNOSIS — J449 Chronic obstructive pulmonary disease, unspecified: Secondary | ICD-10-CM

## 2021-01-06 DIAGNOSIS — F17200 Nicotine dependence, unspecified, uncomplicated: Secondary | ICD-10-CM

## 2021-01-06 DIAGNOSIS — F419 Anxiety disorder, unspecified: Secondary | ICD-10-CM

## 2021-01-06 NOTE — Telephone Encounter (Signed)
Patient is requesting the following medications be sent to [facility] for 90 day supply due to being $0 or lower [facility]ay than [facility] Spironolactone Albuterol HFA Albuterol for nebulizer Omeprazole Potassium supplement Hydroxyzine Topiramate Propranolol Furosemide

## 2021-01-11 NOTE — Chronic Care Management (AMB) (Signed)
Chronic Care Management Pharmacy Note  01/11/2021 Name:  William Whitaker MRN:  [id number] DOB:  1961/04/23  Summary: William Whitaker to have difficulty affording COPD therapy which has lead to low adherence.  Was denied by [facility] program to receive PAP for Spiriva.   Recommendations/Changes made from today's visit: Plan to appeal PAP denial for Spiriva  Subjective: William Whitaker is an 60 y.o. year old male who is a primary patient of Paz, Alda Berthold, MD.  The CCM team was consulted for assistance with disease management and care coordination needs.    Engaged with patient by telephone for follow up visit in response to provider referral for pharmacy case management and/or care coordination services.   Consent to Services:  The patient was given information about Chronic Care Management services, agreed to services, and gave verbal consent prior to initiation of services.  Please see initial visit note for detailed documentation.   Patient Care Team: Colon Branch, MD as PCP - General (Internal Medicine) Cherre Robins, PharmD (Pharmacist)  Recent office visits: 09/29/20-Jose Larose Kells, MD(PCP) F/U chronic conditions. Aspirin removed for med list (patient not taking); Checked labs - CMP, A1c, CBC, ferritin, TSH.   Recent consult visits: None in last 6 months  Hospital visits: None in previous 6 months  Objective:  Lab Results  Component Value Date   CREATININE 0.78 09/29/2020   CREATININE 0.77 03/05/2020   CREATININE 0.53 02/05/2019    Lab Results  Component Value Date   HGBA1C 5.3 09/29/2020   Last diabetic Eye exam: No results found for: HMDIABEYEEXA  Last diabetic Foot exam: No results found for: HMDIABFOOTEX   No results found for: CHOL, TRIG, HDL, CHOLHDL, VLDL, LDLCALC, LDLDIRECT  Hepatic Function Latest Ref Rng & Units 09/29/2020 03/05/2020 02/05/2019  Total Protein 6.0 - 8.3 g/dL 8.0 7.8 7.3  Albumin 3.5 - 5.2 g/dL 4.4 4.1 3.3(L)  AST 0 - 37 U/L 23 36 43(H)  ALT 0 - 53 U/L 13  15 11   Alk Phosphatase 39 - 117 U/L 82 62 98  Total Bilirubin 0.2 - 1.2 mg/dL 0.7 0.8 1.1    Lab Results  Component Value Date/Time   TSH 1.46 09/29/2020 02:59 PM   TSH 3.002 01/25/2019 05:44 AM    CBC Latest Ref Rng & Units 09/29/2020 03/05/2020 02/10/2020  WBC 4.0 - 10.5 K/uL 6.6 4.7 4.9  Hemoglobin 13.0 - 17.0 g/dL 14.4 9.0(L) 7.5 Repeated and verified X2.(LL)  Hematocrit 39.0 - 52.0 % 42.8 32.4(L) 26.1 Repeated and verified X2.(L)  Platelets 150.0 - 400.0 K/uL 143.0(L) 202 151.0    No results found for: VD25OH  Clinical ASCVD: No  The ASCVD Risk score Mikey Bussing DC Jr., et al., 2013) failed to calculate for the following reasons:   Cannot find a previous HDL lab   Cannot find a previous total cholesterol lab     Social History   Tobacco Use  Smoking Status Every Day   Packs/day: 0.50   Years: 25.00   Pack years: 12.50   Types: Cigarettes  Smokeless Tobacco Never  Tobacco Comments   3 PPD, started to decrease to 1/2 ppd 2019, 2021 now smoking 1/4 ppd   BP Readings from Last 3 Encounters:  09/29/20 (!) 144/70  06/01/20 138/74  03/05/20 133/90   Pulse Readings from Last 3 Encounters:  09/29/20 78  06/01/20 80  03/05/20 75   Wt Readings from Last 3 Encounters:  09/29/20 203 lb 6 oz (92.3 kg)  06/01/20 199 lb  4 oz (90.4 kg)  03/05/20 193 lb 12.8 oz (87.9 kg)    Assessment: Review of patient past medical history, allergies, medications, health status, including review of consultants reports, laboratory and other test data, was performed as part of comprehensive evaluation and provision of chronic care management services.   SDOH:  (Social Determinants of Health) assessments and interventions performed:    CCM Care Plan  No Known Allergies  Medications Reviewed Today     Reviewed by Cherre Robins, PharmD (Pharmacist) on 01/06/21 at [place] List Status: <None>   Medication Order Taking? Sig Documenting Provider Last Dose Status Informant  albuterol (VENTOLIN  HFA) 108 (90 Base) MCG/ACT inhaler [id number] Yes Inhale 2 puffs into the lungs every 6 (six) hours as needed for wheezing or shortness of breath. Colon Branch, MD Taking Active   Cholecalciferol (VITAMIN D3) 50 MCG (2000 UT) TABS [id number] Yes Take 1 tablet by mouth daily. [provider] Taking Active   ferrous sulfate 325 (65 FE) MG tablet [id number] Yes Take 1 tablet (325 mg total) by mouth daily with breakfast. Colon Branch, MD Taking Active   furosemide (LASIX) 20 MG tablet [id number] Yes Take 1 tablet (20 mg total) by mouth daily. Colon Branch, MD Taking Active   hydrOXYzine (ATARAX/VISTARIL) 10 MG tablet [id number] Yes Take 1 tablet (10 mg total) by mouth 3 (three) times daily as needed for anxiety. Colon Branch, MD Taking Active   ipratropium (ATROVENT HFA) 17 MCG/ACT inhaler [id number] Yes Inhale 2 puffs into the lungs every 4 (four) hours as needed for wheezing. Colon Branch, MD Taking Active   ipratropium (ATROVENT) 0.02 % nebulizer solution [id number] Yes Take 2.5 mLs (0.5 mg total) by nebulization 4 (four) times daily. Colon Branch, MD Taking Active   Multiple Vitamins-Minerals (ONE-A-DAY MENS VITACRAVES) CHEW [id number] Yes Chew 1 tablet by mouth daily. [provider] Taking Active Self  omeprazole (PRILOSEC) 20 MG capsule [id number] Yes Take 1 capsule (20 mg total) by mouth daily. Colon Branch, MD Taking Active   potassium chloride SA (KLOR-CON M20) 20 MEQ tablet [id number] Yes Take 1 tablet (20 mEq total) by mouth daily. Colon Branch, MD Taking Active   propranolol (INDERAL) 20 MG tablet [id number] Yes Take 1 tablet (20 mg total) by mouth 2 (two) times daily. Colon Branch, MD Taking Active   spironolactone (ALDACTONE) 50 MG tablet [id number] Yes Take 1 tablet (50 mg total) by mouth daily. Colon Branch, MD Taking Active   Tiotropium Bromide Monohydrate (SPIRIVA RESPIMAT) 2.5 MCG/ACT AERS [id number] No Inhale 2.5 mcg into the lungs daily. 2 puffs daily - through [employer] PAP  Patient not  taking: Reported on 01/06/2021   [provider] Not Taking Active   topiramate (TOPAMAX) 25 MG tablet [id number] Yes Take 1 tablet (25 mg total) by mouth daily. Colon Branch, MD Taking Active   vitamin C (ASCORBIC ACID) 250 MG tablet [id number] Yes Take 250 mg by mouth daily. [provider] Taking Active Self  Vitamin Mixture (VITAMIN E COMPLETE PO) [id number] Yes Take by mouth. [provider] Taking Active             Patient Active Problem List   Diagnosis Date Noted   Insomnia 09/29/2020   COPD (chronic obstructive pulmonary disease) ([place]) 04/09/2019   PCP NOTES >>>>>>>>>>>>>>>>>> 02/05/2019   Chronic hepatitis ([place]) 02/05/2019   EtOH dependence ([place]) 01/25/2019   Tobacco dependence 93/71/6967   Alcoholic cirrhosis of liver with  ascites ([place]) 01/25/2019   Hyponatremia 01/25/2019   Iron deficiency anemia    Symptomatic anemia 01/24/2019   Anxiety 08/20/2007   REACTIVE AIRWAY DISEASE 08/20/2007   G E R D 08/20/2007    Immunization History  Administered Date(s) Administered   Hep A / Hep B 06/30/2019   Hepatitis A, Adult 04/21/2019   Hepatitis B, ped/adol 03/11/2019   Influenza Inj Mdck Quad Pf 04/10/2019   Janssen (J&J) SARS-COV-2 Vaccination 02/04/2020    Conditions to be addressed/monitored: COPD, Anxiety, and tobacco use disorder and cirrhosis  Care Plan : General Pharmacy (Adult)  Updates made by Cherre Robins, PHARMD since 01/11/2021 12:00 AM     Problem: COPD, Anxiety, Cirrhosis   Priority: High  Onset Date: 08/19/2020     Long-Range Goal: Patient-Specific Goal   Start Date: 08/19/2020  Expected End Date: 02/16/2021  Recent Progress: On track  Priority: High  Note:   Current Barriers:  Unable to independently afford treatment regimen Not taking Spiriva (maintenance inhaler) daily due to cost  Pharmacist Clinical Goal(s):  Over the next 120 days, patient will verbalize ability to afford treatment regimen achieve adherence to  monitoring guidelines and medication adherence to achieve therapeutic efficacy maintain control of COPD as evidenced by exacerbation frequency  through collaboration with PharmD and provider.   Interventions: 1:1 collaboration with Colon Branch, MD regarding development and update of comprehensive plan of care as evidenced by provider attestation and co-signature Inter-disciplinary care team collaboration (see longitudinal plan of care) Comprehensive medication review performed; medication list updated in electronic medical record  COPD/Tobacco (Goal: control symptoms and prevent exacerbations) Controlled / no recent COPD exacerbations in last year that required treatment Current COPD Classification:  B (high sx, <2 exacerbations/yr) Current therapy:  Alubterol HFA - inhale 2 puffs up to every 6 hours as needed Atrovent (ipratropium) HFA - inhaler 2 puffs every 4 hours as needed Atorvent (ipratropium) nebules - use 1 ampule / nebule in nebulizer 4 times a day as needed Spiriva respimat inhaler - 2 puffs daily Cost of medicaitons for COPD has been barrier to adherence. With [facility] his cost was $148 for Spriva. Yupelri cost the patient over $200 after insurance.  Patient applied for patient assistance for Spiriva but was denied - income was about $600 over limit.  Interventions:  Will appeal [employer] PAP decision to see if can get assistance with Spiriva inhaler. Will have CMA with pharmacy team contact [employer]. Coordinated with [facility] regarding cost of ipratropium nebs which are $1.35; Ipratropium HFA inhaler though was $100 - too expensive for patient.  Continue to use albuterol and ipratropium nebules until we can identify alternatives or assistance for other agens.   Tobacco Use: Continues to smoke 1/2 PPD Paitent reports he is not ready to quit smoking at this time.  First cigarette occurs in the afternoon.  Has tried patches before and tapering number of cigarettes per day - not  successful Current treatment  none Intervention:  Addressed at previous visits Will continue to assess patients readiness for smoking cessation  Depression/Anxiety (Goal: Minimize symptoms) Improving Current treatment: Hydroxyzine 34m 3 times a day as needed Medications previously tried/failed: none noted Reports he takes prn for sleep or if he needs to during the day. Interventions:  None today - has been addressed at previous visits Recommended to continue current medication  Cirrhosis/Alcohol use disorder (Goal: Maintain) Controlled Managed by GI - Dr CBryan LemmaCurrent treatment  Furosemide 228mdaily Potassium 2033mdaily Omeprazole 24m15m  daily Propranolol 26m twice daily Spironolactone 540mdaily Topiramate 2584maily Medications previously tried: none noted  Intervention:  Recommended to continue current medication  Anemia:  Last H/H was 14.4 / 42.8 (improved) Last Ferritin was 18.5 (low but improved) Current therapy:  Ferrous sulfate 325m58mily with breakfast Interventions:  Continue current therapy  Patient Goals/Self-Care Activities Over the next 120 days, patient will:  take medications as prescribed focus on medication adherence by pill count collaborate with provider on medication access solutions  Follow Up Plan: The care management team will reach out to the PAP program for appeal;  Clinical pharmacist follow up in 3 to 4 months.        Medication Assistance:  Denied by [facility] for Spiriva - income about $600 above cut off. Will try to appeal.   [facility]:  [facility] [id number]: [phone]: [phone]/pharmacy [id number] - [address]OSS [id number]: [phone] Mail  Service  ([facility] [id number]: [phone] Up:  Patient agrees to Care Plan and Follow-up.  Plan:  Pharmacy team CMP will work on appeal for [employer] PAP; pharmacist follow up in 3 to 4 months.   TammyCherre RobinsrmD Clinical Pharmacist [phone]

## 2021-01-11 NOTE — Patient Instructions (Signed)
Visit Information  PATIENT GOALS:  Goals Addressed             This Visit's Progress    Chronic Care Management Pharmacy Care Plan       CARE PLAN ENTRY (see longitudinal plan of care for additional care plan information)  Current Barriers:  Chronic Disease Management support, education, and care coordination needs related to COPD/Tobacco Use Disorder, Cirrhosis, Hep C, Anxiety   COPD/Tobacco Use Disorder Pharmacist Clinical Goal(s) Over the next 90 days, patient will work with PharmD and providers to create an affordable treatment plan for his inhaler regimen Over the next 90 days, patient will decrease/eliminate his use of cigarettes Current regimen:  Spiriva Respimat 2.70mcg 2 puffs daily Albuterol inhaler - 2 puffs every 6 hours as needed Ipratropium 0.02% nebulized solution or inhaler up to 4 times a day as needed Interventions:  Completed [employer] patient assistance application, signed by PCP and faxed to [employer] (initial application was denied)  Plan to appeal decision as patient income was just slightly above cut offi. Patient self care activities - Over the next 90 days, patient will: Remain adherent to inhaler regimen Decrease cigarette smoking by at least 1-2 cigarettes per day Notify office once you receive Spiriva delivery  Anxiety Pharmacist Clinical Goal(s) Over the next 90 days, patient will work with PharmD and providers to reduce symptoms/limitations of anxiety Current regimen:  Hydroxyzine 10mg  3 times a day as needed for anxiety or sleep.  Patient self care activities - Over the next 90 days, patient will: Continue hydroxyzine for anxiety  Medication management Pharmacist Clinical Goal(s): Over the next 90 days, patient will work with PharmD and providers to maintain optimal medication adherence [facility] Interventions Comprehensive medication review performed. Continue current medication management strategy Patient  self care activities - Over the next 90 days, patient will: Focus on medication adherence by filling medications appropriately Take medications as prescribed Report any questions or concerns to PharmD and/or provider(s)  Please see past updates related to this goal by clicking on the "Past Updates" button in the selected goal           The patient verbalized understanding of instructions, educational materials, and care plan provided today and declined offer to receive copy of patient instructions, educational materials, and care plan.   Follow up : Phamacy team CMA will follow up regarding appeal for [facility] Care program; Pharmacist f/u in 3 to 4 months unless needed sooner.   Cherre Robins, PharmD Clinical Pharmacist [facility] [phone]

## 2021-01-12 DIAGNOSIS — K766 Portal hypertension: Secondary | ICD-10-CM | POA: Diagnosis not present

## 2021-01-12 DIAGNOSIS — K7469 Other cirrhosis of liver: Secondary | ICD-10-CM | POA: Diagnosis not present

## 2021-01-12 DIAGNOSIS — Z79899 Other long term (current) drug therapy: Secondary | ICD-10-CM | POA: Diagnosis not present

## 2021-01-12 DIAGNOSIS — K9189 Other postprocedural complications and disorders of digestive system: Secondary | ICD-10-CM | POA: Diagnosis not present

## 2021-01-12 DIAGNOSIS — Z9049 Acquired absence of other specified parts of digestive tract: Secondary | ICD-10-CM | POA: Diagnosis not present

## 2021-01-12 DIAGNOSIS — F17210 Nicotine dependence, cigarettes, uncomplicated: Secondary | ICD-10-CM | POA: Diagnosis not present

## 2021-01-12 DIAGNOSIS — R059 Cough, unspecified: Secondary | ICD-10-CM | POA: Diagnosis not present

## 2021-01-12 DIAGNOSIS — N3289 Other specified disorders of bladder: Secondary | ICD-10-CM | POA: Diagnosis not present

## 2021-01-12 DIAGNOSIS — E871 Hypo-osmolality and hyponatremia: Secondary | ICD-10-CM | POA: Diagnosis not present

## 2021-01-12 DIAGNOSIS — K5641 Fecal impaction: Secondary | ICD-10-CM | POA: Diagnosis not present

## 2021-01-12 DIAGNOSIS — K3531 Acute appendicitis with localized peritonitis and gangrene, without perforation: Secondary | ICD-10-CM | POA: Diagnosis not present

## 2021-01-12 DIAGNOSIS — R911 Solitary pulmonary nodule: Secondary | ICD-10-CM | POA: Diagnosis not present

## 2021-01-12 DIAGNOSIS — E876 Hypokalemia: Secondary | ICD-10-CM | POA: Diagnosis not present

## 2021-01-12 DIAGNOSIS — I2510 Atherosclerotic heart disease of native coronary artery without angina pectoris: Secondary | ICD-10-CM | POA: Diagnosis not present

## 2021-01-12 DIAGNOSIS — R066 Hiccough: Secondary | ICD-10-CM | POA: Diagnosis not present

## 2021-01-12 DIAGNOSIS — K5939 Other megacolon: Secondary | ICD-10-CM | POA: Diagnosis not present

## 2021-01-12 DIAGNOSIS — K3532 Acute appendicitis with perforation and localized peritonitis, without abscess: Secondary | ICD-10-CM | POA: Diagnosis not present

## 2021-01-12 DIAGNOSIS — K7460 Unspecified cirrhosis of liver: Secondary | ICD-10-CM | POA: Diagnosis not present

## 2021-01-12 DIAGNOSIS — Z9889 Other specified postprocedural states: Secondary | ICD-10-CM | POA: Diagnosis not present

## 2021-01-12 DIAGNOSIS — J90 Pleural effusion, not elsewhere classified: Secondary | ICD-10-CM | POA: Diagnosis not present

## 2021-01-12 DIAGNOSIS — K567 Ileus, unspecified: Secondary | ICD-10-CM | POA: Diagnosis not present

## 2021-01-12 DIAGNOSIS — D509 Iron deficiency anemia, unspecified: Secondary | ICD-10-CM | POA: Diagnosis not present

## 2021-01-12 DIAGNOSIS — Z5331 Laparoscopic surgical procedure converted to open procedure: Secondary | ICD-10-CM | POA: Diagnosis not present

## 2021-01-12 DIAGNOSIS — K5989 Other specified functional intestinal disorders: Secondary | ICD-10-CM | POA: Diagnosis not present

## 2021-01-12 DIAGNOSIS — I959 Hypotension, unspecified: Secondary | ICD-10-CM | POA: Diagnosis not present

## 2021-01-12 DIAGNOSIS — J449 Chronic obstructive pulmonary disease, unspecified: Secondary | ICD-10-CM | POA: Diagnosis not present

## 2021-01-12 DIAGNOSIS — E46 Unspecified protein-calorie malnutrition: Secondary | ICD-10-CM | POA: Diagnosis not present

## 2021-01-12 DIAGNOSIS — K7689 Other specified diseases of liver: Secondary | ICD-10-CM | POA: Diagnosis not present

## 2021-01-12 DIAGNOSIS — K3533 Acute appendicitis with perforation and localized peritonitis, with abscess: Secondary | ICD-10-CM | POA: Diagnosis not present

## 2021-01-12 DIAGNOSIS — K429 Umbilical hernia without obstruction or gangrene: Secondary | ICD-10-CM | POA: Diagnosis not present

## 2021-01-12 DIAGNOSIS — Z20822 Contact with and (suspected) exposure to covid-19: Secondary | ICD-10-CM | POA: Diagnosis not present

## 2021-01-12 DIAGNOSIS — Z4682 Encounter for fitting and adjustment of non-vascular catheter: Secondary | ICD-10-CM | POA: Diagnosis not present

## 2021-01-12 DIAGNOSIS — J8410 Pulmonary fibrosis, unspecified: Secondary | ICD-10-CM | POA: Diagnosis not present

## 2021-01-12 DIAGNOSIS — K4090 Unilateral inguinal hernia, without obstruction or gangrene, not specified as recurrent: Secondary | ICD-10-CM | POA: Diagnosis not present

## 2021-01-12 DIAGNOSIS — K3580 Unspecified acute appendicitis: Secondary | ICD-10-CM | POA: Diagnosis not present

## 2021-01-12 DIAGNOSIS — R918 Other nonspecific abnormal finding of lung field: Secondary | ICD-10-CM | POA: Diagnosis not present

## 2021-01-12 DIAGNOSIS — M47814 Spondylosis without myelopathy or radiculopathy, thoracic region: Secondary | ICD-10-CM | POA: Diagnosis not present

## 2021-01-12 DIAGNOSIS — K3189 Other diseases of stomach and duodenum: Secondary | ICD-10-CM | POA: Diagnosis not present

## 2021-01-12 DIAGNOSIS — K381 Appendicular concretions: Secondary | ICD-10-CM | POA: Diagnosis not present

## 2021-01-12 DIAGNOSIS — I10 Essential (primary) hypertension: Secondary | ICD-10-CM | POA: Diagnosis not present

## 2021-01-12 DIAGNOSIS — R140 Abdominal distension (gaseous): Secondary | ICD-10-CM | POA: Diagnosis not present

## 2021-01-12 DIAGNOSIS — K6389 Other specified diseases of intestine: Secondary | ICD-10-CM | POA: Diagnosis not present

## 2021-01-12 DIAGNOSIS — K529 Noninfective gastroenteritis and colitis, unspecified: Secondary | ICD-10-CM | POA: Diagnosis not present

## 2021-01-12 DIAGNOSIS — R0682 Tachypnea, not elsewhere classified: Secondary | ICD-10-CM | POA: Diagnosis not present

## 2021-01-12 HISTORY — PX: APPENDECTOMY: SHX54

## 2021-01-12 MED ORDER — ALBUTEROL SULFATE HFA 108 (90 BASE) MCG/ACT IN AERS: 2.0000 | INHALATION_SPRAY | Freq: Four times a day (QID) | RESPIRATORY_TRACT | 1 refills | Status: DC | PRN

## 2021-01-12 MED ORDER — OMEPRAZOLE 20 MG PO CPDR: 20.0000 mg | DELAYED_RELEASE_CAPSULE | Freq: Every day | ORAL | 1 refills | Status: DC

## 2021-01-12 MED ORDER — IPRATROPIUM BROMIDE 0.02 % IN SOLN: 0.5000 mg | Freq: Four times a day (QID) | RESPIRATORY_TRACT | 12 refills | Status: DC

## 2021-01-12 MED ORDER — FUROSEMIDE 20 MG PO TABS: 20.0000 mg | ORAL_TABLET | Freq: Every day | ORAL | 1 refills | Status: DC

## 2021-01-12 MED ORDER — HYDROXYZINE HCL 10 MG PO TABS: 10.0000 mg | ORAL_TABLET | Freq: Three times a day (TID) | ORAL | 1 refills | Status: DC | PRN

## 2021-01-12 MED ORDER — SPIRONOLACTONE 50 MG PO TABS: 50.0000 mg | ORAL_TABLET | Freq: Every day | ORAL | 1 refills | Status: DC

## 2021-01-12 MED ORDER — PROPRANOLOL HCL 20 MG PO TABS: 20.0000 mg | ORAL_TABLET | Freq: Two times a day (BID) | ORAL | 1 refills | Status: DC

## 2021-01-12 MED ORDER — TOPIRAMATE 25 MG PO TABS: 25.0000 mg | ORAL_TABLET | Freq: Every day | ORAL | 1 refills | Status: DC

## 2021-01-12 MED ORDER — POTASSIUM CHLORIDE CRYS ER 20 MEQ PO TBCR: 20.0000 meq | EXTENDED_RELEASE_TABLET | Freq: Every day | ORAL | 1 refills | Status: DC

## 2021-01-12 NOTE — Telephone Encounter (Signed)
Rxs sent

## 2021-01-24 ENCOUNTER — Telehealth: Payer: Self-pay

## 2021-01-24 NOTE — Telephone Encounter (Signed)
Transition Care Management Follow-up Telephone Call Date of discharge and from where: 01/23/2021-[facility] How have you been since you were released from the hospital? Doing ok but still having a lot of pain at the incision site Any questions or concerns? No  Items Reviewed: Did the pt receive and understand the discharge instructions provided? Yes  Medications obtained and verified? Yes  Other? No  Any new allergies since your discharge? No  Dietary orders reviewed? Yes Do you have support at home? Yes   Home Care and Equipment/Supplies: Were home health services ordered? yes If so, what is the name of the agency? [facility]  Has the agency set up a time to come to the patient's home? no Were any new equipment or medical supplies ordered?  No What is the name of the medical supply agency? N/a Were you able to get the supplies/equipment? N/a Do you have any questions related to the use of the equipment or supplies? N/a  Functional Questionnaire: (I = Independent and D = Dependent) ADLs: I with assistance  Bathing/Dressing- I with assistance  Meal Prep- D  Eating- I  Maintaining continence- I with assistance  Transferring/Ambulation- I with assistance  Managing Meds- I  Follow up appointments reviewed:  PCP Hospital f/u appt confirmed? Yes  Scheduled to see Dr. Larose Kells on 02/01/2021 @ 1:20. [facility] f/u appt confirmed? Yes  Patient states he has an appt with surgeon in 2 weeks but does not know the exact date Are transportation arrangements needed? No  If their condition worsens, is the pt aware to call PCP or go to the Emergency Dept.? Yes Was the patient provided with contact information for the PCP's office or ED? Yes Was to pt encouraged to call back with questions or concerns? Yes

## 2021-01-27 ENCOUNTER — Telehealth: Payer: Self-pay

## 2021-01-27 DIAGNOSIS — K7460 Unspecified cirrhosis of liver: Secondary | ICD-10-CM | POA: Diagnosis not present

## 2021-01-27 DIAGNOSIS — E871 Hypo-osmolality and hyponatremia: Secondary | ICD-10-CM | POA: Diagnosis not present

## 2021-01-27 DIAGNOSIS — Z4801 Encounter for change or removal of surgical wound dressing: Secondary | ICD-10-CM | POA: Diagnosis not present

## 2021-01-27 DIAGNOSIS — Z48815 Encounter for surgical aftercare following surgery on the digestive system: Secondary | ICD-10-CM | POA: Diagnosis not present

## 2021-01-27 NOTE — Telephone Encounter (Signed)
William Whitaker with William Whitaker called stating pt was discharged from hospital on Sunday, had appendix removed and hernia repair.  She is wanting to know if PCP will sign orders for patient's home care.  CB # [id number].

## 2021-01-28 ENCOUNTER — Telehealth: Payer: Self-pay | Admitting: Internal Medicine

## 2021-01-28 DIAGNOSIS — T8189XA Other complications of procedures, not elsewhere classified, initial encounter: Secondary | ICD-10-CM | POA: Diagnosis not present

## 2021-01-28 NOTE — Telephone Encounter (Signed)
Spoke w/ Nira Conn- informed PCP would sign orders.

## 2021-01-28 NOTE — Telephone Encounter (Signed)
Spoke w/ Griffith Citron at [facility]- changed to [employer] brand inhaler instead of Ventolin, also Rx not refillable until 01/31/21.

## 2021-01-28 NOTE — Telephone Encounter (Signed)
Please advise- Pt has hosp f/u scheduled 01/31/21.

## 2021-01-28 NOTE — Telephone Encounter (Signed)
Spoke w/ Pt- informed of below. Pt verbalized understanding.  

## 2021-01-28 NOTE — Telephone Encounter (Signed)
Yes , I will

## 2021-01-28 NOTE — Telephone Encounter (Signed)
Patient called stating [facility] need a document from [place] stating is ok for him to get a 90 days supply for albuterol

## 2021-01-31 ENCOUNTER — Encounter: Payer: Self-pay | Admitting: Internal Medicine

## 2021-01-31 ENCOUNTER — Other Ambulatory Visit: Payer: Self-pay

## 2021-01-31 ENCOUNTER — Ambulatory Visit (INDEPENDENT_AMBULATORY_CARE_PROVIDER_SITE_OTHER): Payer: Medicare Other | Admitting: Internal Medicine

## 2021-01-31 ENCOUNTER — Telehealth: Payer: Self-pay

## 2021-01-31 VITALS — BP 124/80 | HR 93 | Temp 97.9°F | Resp 18 | Ht 73.0 in | Wt 194.1 lb

## 2021-01-31 DIAGNOSIS — K7031 Alcoholic cirrhosis of liver with ascites: Secondary | ICD-10-CM | POA: Diagnosis not present

## 2021-01-31 DIAGNOSIS — G8918 Other acute postprocedural pain: Secondary | ICD-10-CM | POA: Diagnosis not present

## 2021-01-31 DIAGNOSIS — R911 Solitary pulmonary nodule: Secondary | ICD-10-CM

## 2021-01-31 LAB — CBC WITH DIFFERENTIAL/PLATELET
Basophils Absolute: 0.1 10*3/uL (ref 0.0–0.1)
Basophils Relative: 1.2 % (ref 0.0–3.0)
Eosinophils Absolute: 0.2 10*3/uL (ref 0.0–0.7)
Eosinophils Relative: 3.1 % (ref 0.0–5.0)
HCT: 36.9 % — ABNORMAL LOW (ref 39.0–52.0)
Hemoglobin: 12.2 g/dL — ABNORMAL LOW (ref 13.0–17.0)
Lymphocytes Relative: 24.4 % (ref 12.0–46.0)
Lymphs Abs: 1.4 10*3/uL (ref 0.7–4.0)
MCHC: 33 g/dL (ref 30.0–36.0)
MCV: 91.2 fl (ref 78.0–100.0)
Monocytes Absolute: 0.8 10*3/uL (ref 0.1–1.0)
Monocytes Relative: 14.3 % — ABNORMAL HIGH (ref 3.0–12.0)
Neutro Abs: 3.3 10*3/uL (ref 1.4–7.7)
Neutrophils Relative %: 57 % (ref 43.0–77.0)
Platelets: 223 10*3/uL (ref 150.0–400.0)
RBC: 4.05 Mil/uL — ABNORMAL LOW (ref 4.22–5.81)
RDW: 14.8 % (ref 11.5–15.5)
WBC: 5.7 10*3/uL (ref 4.0–10.5)

## 2021-01-31 LAB — COMPREHENSIVE METABOLIC PANEL
ALT: 21 U/L (ref 0–53)
AST: 29 U/L (ref 0–37)
Albumin: 4 g/dL (ref 3.5–5.2)
Alkaline Phosphatase: 99 U/L (ref 39–117)
BUN: 7 mg/dL (ref 6–23)
CO2: 26 mEq/L (ref 19–32)
Calcium: 9 mg/dL (ref 8.4–10.5)
Chloride: 101 mEq/L (ref 96–112)
Creatinine, Ser: 0.71 mg/dL (ref 0.40–1.50)
GFR: 99.18 mL/min (ref >60.00)
Glucose, Bld: 90 mg/dL (ref 70–99)
Potassium: 4.3 mEq/L (ref 3.5–5.1)
Sodium: 135 mEq/L (ref 135–145)
Total Bilirubin: 0.6 mg/dL (ref 0.2–1.2)
Total Protein: 7.7 g/dL (ref 6.0–8.3)

## 2021-01-31 LAB — MAGNESIUM: Magnesium: 1.4 mg/dL — ABNORMAL LOW (ref 1.5–2.5)

## 2021-01-31 MED ORDER — HYDROCODONE-ACETAMINOPHEN 5-325 MG PO TABS: 1.0000 | ORAL_TABLET | Freq: Three times a day (TID) | ORAL | 0 refills | Status: DC | PRN

## 2021-01-31 NOTE — Progress Notes (Signed)
Subjective:    Patient ID: William Whitaker, male    DOB: 1960/09/12, 60 y.o.   MRN: [id number]  DOS:  01/31/2021 Type of visit - description: TCM 14  Admitted to an outside hospital 01/12/2021, discharge 01/23/2021. Had a perforated appendicitis, status post laparoscopic converted to open appendectomy, they also repaired umbilical hernia. Surgical wound was left open. + Postop ileus.  Resolved. Hyponatremia was a stable during the hospital stay. Last CBC showed a hemoglobin of 10.4.Last sodium 131, creatinine 0.9, magnesium was a slightly low. A new nodule was noted on CT, see report below:  CT CHEST, ABDOMEN AND PELVIS WITHOUT CONTRAST 01/12/2021 CT of the chest:Scattered granulomas throughout both lungs. The dominant nodule in the right upper lobe is stable in appearance from the prior study.   There is a new right middle lobe nodule however best seen on image number 89 of series 3 which has a somewhat spiculated margin and measures approximately 8 mm. This is new from the prior exam from 2020. Consider one of the following in 3 months for both low-risk and high-risk individuals: (a) repeat chest CT, (b) follow-up PET-CT, or (c) tissue sampling. This recommendation follows the consensus statement: Guidelines for Management of Incidental Pulmonary Nodules Detected on CT Images: From the Fleischner Society 2017; Radiology 2017; 284:228-243.   New right-sided pleural effusion is noted.   CT of the abdomen and pelvis: Changes consistent with acute appendicitis without definitive rupture. Small foci of air are noted in the right lower quadrant although felt to be intraluminal as described above.   Changes of hepatic cirrhosis with splenomegaly.     Review of Systems Since he left the hospital, he denies fever chills. Good p.o. tolerance. Has daily BMs. He still has a great deal of pain at the surgical wound. The wound is open, is taking care of by RN and his  family.  When he  remove the gauze he only sees some bloody discharge.  No pus. Shortness of breath at baseline, history of COPD.   Past Medical History:  Diagnosis Date   Anxiety    Bronchitis    GERD (gastroesophageal reflux disease)    Insomnia     Past Surgical History:  Procedure Laterality Date   APPENDECTOMY  01/12/2021   open   BIOPSY  01/27/2019   Procedure: BIOPSY;  Surgeon: Irving Copas., MD;  Location: [facility];  Service: Gastroenterology;;   COLONOSCOPY WITH PROPOFOL N/A 01/27/2019   Procedure: COLONOSCOPY WITH PROPOFOL;  Surgeon: Irving Copas., MD;  Location: [facility];  Service: Gastroenterology;  Laterality: N/A;   ESOPHAGOGASTRODUODENOSCOPY (EGD) WITH PROPOFOL N/A 01/27/2019   Procedure: ESOPHAGOGASTRODUODENOSCOPY (EGD) WITH PROPOFOL;  Surgeon: Rush Landmark Telford Nab., MD;  Location: [facility];  Service: Gastroenterology;  Laterality: N/A;   HERNIA REPAIR Right remotely as a baby   POLYPECTOMY  01/27/2019   Procedure: POLYPECTOMY;  Surgeon: Rush Landmark Telford Nab., MD;  Location: [facility];  Service: Gastroenterology;;    Allergies as of 01/31/2021   No Known Allergies      Medication List        Accurate as of January 31, 2021 11:59 PM. If you have any questions, ask your nurse or doctor.          albuterol 108 (90 Base) MCG/ACT inhaler Commonly known as: VENTOLIN HFA Inhale 2 puffs into the lungs every 6 (six) hours as needed for wheezing or shortness of breath.   Atrovent HFA 17 MCG/ACT inhaler Generic drug: ipratropium Inhale 2  puffs into the lungs every 4 (four) hours as needed for wheezing.   ferrous sulfate 325 (65 FE) MG tablet Take 1 tablet (325 mg total) by mouth daily with breakfast.   furosemide 20 MG tablet Commonly known as: LASIX Take 1 tablet (20 mg total) by mouth daily.   HYDROcodone-acetaminophen 5-325 MG tablet Commonly known as: NORCO/VICODIN Take 1-2 tablets by mouth 3 (three) times daily as needed. What  changed:  how much to take when to take this Changed by: Kathlene November, MD   hydrOXYzine 10 MG tablet Commonly known as: ATARAX/VISTARIL Take 1 tablet (10 mg total) by mouth 3 (three) times daily as needed for anxiety.   ipratropium 0.02 % nebulizer solution Commonly known as: ATROVENT Take 2.5 mLs (0.5 mg total) by nebulization 4 (four) times daily.   omeprazole 20 MG capsule Commonly known as: PRILOSEC Take 1 capsule (20 mg total) by mouth daily.   One-A-Day Mens VitaCraves Chew Chew 1 tablet by mouth daily.   potassium chloride SA 20 MEQ tablet Commonly known as: Klor-Con M20 Take 1 tablet (20 mEq total) by mouth daily.   propranolol 20 MG tablet Commonly known as: INDERAL Take 1 tablet (20 mg total) by mouth 2 (two) times daily.   Spiriva Respimat 2.5 MCG/ACT Aers Generic drug: Tiotropium Bromide Monohydrate Inhale 2.5 mcg into the lungs daily. 2 puffs daily - through [employer] PAP   spironolactone 50 MG tablet Commonly known as: ALDACTONE Take 1 tablet (50 mg total) by mouth daily.   topiramate 25 MG tablet Commonly known as: TOPAMAX Take 1 tablet (25 mg total) by mouth daily.   vitamin C 250 MG tablet Commonly known as: ASCORBIC ACID Take 250 mg by mouth daily.   Vitamin D3 50 MCG (2000 UT) Tabs Take 1 tablet by mouth daily.   VITAMIN E COMPLETE PO Take by mouth.           Objective:   Physical Exam BP 124/80 (BP Location: Left Arm, Patient Position: Sitting, Cuff Size: Small)   Pulse 93   Temp 97.9 F (36.6 C) (Oral)   Resp 18   Ht '6\' 1"'$  (1.854 m)   Wt 194 lb 2 oz (88.1 kg)   SpO2 93%   BMI 25.61 kg/m  General:   Well developed, seems to be in a lot of pain with breathing or cough.  HEENT:  Normocephalic . Face symmetric, atraumatic Lungs:  CTA B Normal respiratory effort, no intercostal retractions, no accessory muscle use. Heart: RRR,  no murmur. Abdomen: Has multiple covers, bandages Lower extremities: no pretibial edema bilaterally   Skin: Not pale. Not jaundice Neurologic:  alert & oriented X3.  Speech normal, gait assisted by a walker.   Psych--  Cognition and judgment appear intact.  Cooperative with normal attention span and concentration.  Behavior appropriate. No anxious or depressed appearing.      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 Tobacco abuse Anemia, severe.  PLAN Perforated appendix, umbilical hernia repair: Patient recently went to an outside hospital, Dx perforated appendix, laparoscopic surgery was switched to open, umbilical hernia was repaired, the wound was left open. Fortunately, no fever chills, good p.o. tolerance, having normal bowel movements. He reports  a great deal of pain, hydrocodone refill. Wound is take care of by home health twice a week. To see surgery  tomorrow. COPD, new lung nodule per CT: Shortness of breath at baseline, and new nodule found on CT, see report under HPI, refer to pulmonary, nonurgent. Cirrhosis: Checking CMP, CBC, magnesium. RTC 3 months  This visit occurred during the SARS-CoV-2 public health emergency.  Safety protocols were in place, including screening questions prior to the visit, additional usage of staff PPE, and extensive cleaning of exam room while observing appropriate contact time as indicated for disinfecting solutions.

## 2021-01-31 NOTE — Patient Instructions (Addendum)
For pain: You can take hydrocodone every 8 hours.  In between if you have some pain you can take 1 Tylenol 500 mg.  We are referring you to our pulmonary doctors for spot on your lung   GO TO THE LAB : Get the blood work     [place], [place] back for a checkup in 3 months

## 2021-01-31 NOTE — Telephone Encounter (Signed)
Wound care orders signed and faxed back to [facility] at [phone]. Form sent for scanning.

## 2021-02-01 ENCOUNTER — Inpatient Hospital Stay: Payer: Medicare Other | Admitting: Internal Medicine

## 2021-02-01 ENCOUNTER — Telehealth: Payer: Self-pay

## 2021-02-01 NOTE — Assessment & Plan Note (Signed)
Perforated appendix, umbilical hernia repair: Patient recently went to an outside hospital, Dx perforated appendix, laparoscopic surgery was switched to open, umbilical hernia was repaired, the wound was left open. Fortunately, no fever chills, good p.o. tolerance, having normal bowel movements. He reports  a great deal of pain, hydrocodone refill. Wound is take care of by home health twice a week. To see surgery tomorrow. COPD, new lung nodule per CT: Shortness of breath at baseline, and new nodule found on CT, see report under HPI, refer to pulmonary, nonurgent. Cirrhosis: Checking CMP, CBC, magnesium. RTC 3 months

## 2021-02-01 NOTE — Telephone Encounter (Signed)
Pt called wanted to know his results from lab yesterday and also would like a refill on the medication for his cough.

## 2021-02-01 NOTE — Telephone Encounter (Signed)
Advise patient:  Labs are satisfactory.  Hemoglobin has increased, magnesium is minimally low, recommend OTC magnesium 1 tablet daily for 10 days. Please ask him what cough medicine he is referring to.

## 2021-02-02 DIAGNOSIS — Z48815 Encounter for surgical aftercare following surgery on the digestive system: Secondary | ICD-10-CM | POA: Diagnosis not present

## 2021-02-02 DIAGNOSIS — Z4801 Encounter for change or removal of surgical wound dressing: Secondary | ICD-10-CM | POA: Diagnosis not present

## 2021-02-02 DIAGNOSIS — E871 Hypo-osmolality and hyponatremia: Secondary | ICD-10-CM | POA: Diagnosis not present

## 2021-02-02 DIAGNOSIS — K7460 Unspecified cirrhosis of liver: Secondary | ICD-10-CM | POA: Diagnosis not present

## 2021-02-02 MED ORDER — BENZONATATE 100 MG PO CAPS: 100.0000 mg | ORAL_CAPSULE | Freq: Three times a day (TID) | ORAL | 0 refills | Status: DC | PRN

## 2021-02-02 NOTE — Telephone Encounter (Signed)
Rx sent 

## 2021-02-02 NOTE — Telephone Encounter (Signed)
Send Tessalon Perles 100 mg 1 p.o. 3 times daily as needed #21 no refills

## 2021-02-02 NOTE — Telephone Encounter (Signed)
Spoke w/ Pt- informed of results and recommendations. He was wondering if he could get a refill on the Tessalon perles as it hurts him to cough with his open incision.

## 2021-02-04 ENCOUNTER — Telehealth: Payer: Self-pay

## 2021-02-04 DIAGNOSIS — K7460 Unspecified cirrhosis of liver: Secondary | ICD-10-CM | POA: Diagnosis not present

## 2021-02-04 DIAGNOSIS — I10 Essential (primary) hypertension: Secondary | ICD-10-CM | POA: Diagnosis not present

## 2021-02-04 DIAGNOSIS — Z4801 Encounter for change or removal of surgical wound dressing: Secondary | ICD-10-CM | POA: Diagnosis not present

## 2021-02-04 DIAGNOSIS — B1920 Unspecified viral hepatitis C without hepatic coma: Secondary | ICD-10-CM | POA: Diagnosis not present

## 2021-02-04 DIAGNOSIS — E871 Hypo-osmolality and hyponatremia: Secondary | ICD-10-CM | POA: Diagnosis not present

## 2021-02-04 DIAGNOSIS — Z9181 History of falling: Secondary | ICD-10-CM | POA: Diagnosis not present

## 2021-02-04 DIAGNOSIS — J449 Chronic obstructive pulmonary disease, unspecified: Secondary | ICD-10-CM | POA: Diagnosis not present

## 2021-02-04 DIAGNOSIS — J8410 Pulmonary fibrosis, unspecified: Secondary | ICD-10-CM | POA: Diagnosis not present

## 2021-02-04 DIAGNOSIS — T8189XA Other complications of procedures, not elsewhere classified, initial encounter: Secondary | ICD-10-CM | POA: Diagnosis not present

## 2021-02-04 DIAGNOSIS — Z48815 Encounter for surgical aftercare following surgery on the digestive system: Secondary | ICD-10-CM | POA: Diagnosis not present

## 2021-02-04 DIAGNOSIS — J90 Pleural effusion, not elsewhere classified: Secondary | ICD-10-CM | POA: Diagnosis not present

## 2021-02-04 DIAGNOSIS — F17210 Nicotine dependence, cigarettes, uncomplicated: Secondary | ICD-10-CM | POA: Diagnosis not present

## 2021-02-04 DIAGNOSIS — Z9049 Acquired absence of other specified parts of digestive tract: Secondary | ICD-10-CM | POA: Diagnosis not present

## 2021-02-04 NOTE — Telephone Encounter (Signed)
Plan of care signed and faxed back to [facility] at [phone]. Form sent for scanning.  

## 2021-02-07 DIAGNOSIS — Z48815 Encounter for surgical aftercare following surgery on the digestive system: Secondary | ICD-10-CM | POA: Diagnosis not present

## 2021-02-07 DIAGNOSIS — Z4801 Encounter for change or removal of surgical wound dressing: Secondary | ICD-10-CM | POA: Diagnosis not present

## 2021-02-07 DIAGNOSIS — E871 Hypo-osmolality and hyponatremia: Secondary | ICD-10-CM | POA: Diagnosis not present

## 2021-02-07 DIAGNOSIS — K7460 Unspecified cirrhosis of liver: Secondary | ICD-10-CM | POA: Diagnosis not present

## 2021-02-08 ENCOUNTER — Telehealth: Payer: Self-pay | Admitting: Internal Medicine

## 2021-02-08 NOTE — Telephone Encounter (Signed)
Recent open appendectomy, Pt requesting refill on hydrocodone for pain   Requesting: hydrocodone 5-'325mg'$  Contract: None UDS: None Last Visit: 01/31/2021 Next Visit: None Last Refill: 01/31/2021 #30 and 0RF Pt sig: 1-2 tid prn  Please Advise

## 2021-02-08 NOTE — Telephone Encounter (Signed)
Patient stated paz wrote him something for pain & he is still in pain.... his belly button is in a lot of pain right now and feels tight/not easying up     [facility] on [address]   HYDROcodone-acetaminophen (NORCO/VICODIN) 5-325 MG tablet [id number]

## 2021-02-09 DIAGNOSIS — Z48815 Encounter for surgical aftercare following surgery on the digestive system: Secondary | ICD-10-CM | POA: Diagnosis not present

## 2021-02-09 DIAGNOSIS — J90 Pleural effusion, not elsewhere classified: Secondary | ICD-10-CM | POA: Diagnosis not present

## 2021-02-09 DIAGNOSIS — K7460 Unspecified cirrhosis of liver: Secondary | ICD-10-CM | POA: Diagnosis not present

## 2021-02-09 DIAGNOSIS — B1920 Unspecified viral hepatitis C without hepatic coma: Secondary | ICD-10-CM | POA: Diagnosis not present

## 2021-02-09 DIAGNOSIS — Z4801 Encounter for change or removal of surgical wound dressing: Secondary | ICD-10-CM | POA: Diagnosis not present

## 2021-02-09 DIAGNOSIS — J449 Chronic obstructive pulmonary disease, unspecified: Secondary | ICD-10-CM | POA: Diagnosis not present

## 2021-02-09 DIAGNOSIS — J8410 Pulmonary fibrosis, unspecified: Secondary | ICD-10-CM | POA: Diagnosis not present

## 2021-02-09 DIAGNOSIS — Z9049 Acquired absence of other specified parts of digestive tract: Secondary | ICD-10-CM | POA: Diagnosis not present

## 2021-02-09 DIAGNOSIS — E871 Hypo-osmolality and hyponatremia: Secondary | ICD-10-CM | POA: Diagnosis not present

## 2021-02-09 DIAGNOSIS — F17210 Nicotine dependence, cigarettes, uncomplicated: Secondary | ICD-10-CM | POA: Diagnosis not present

## 2021-02-09 DIAGNOSIS — Z9181 History of falling: Secondary | ICD-10-CM | POA: Diagnosis not present

## 2021-02-09 DIAGNOSIS — I10 Essential (primary) hypertension: Secondary | ICD-10-CM | POA: Diagnosis not present

## 2021-02-09 MED ORDER — HYDROCODONE-ACETAMINOPHEN 5-325 MG PO TABS: 1.0000 | ORAL_TABLET | Freq: Three times a day (TID) | ORAL | 0 refills | Status: DC | PRN

## 2021-02-09 NOTE — Telephone Encounter (Signed)
LMOM informing Pt of PCP recommendations. Instructed to call office if questions/concerns.

## 2021-02-09 NOTE — Telephone Encounter (Signed)
Pt called back stating he is still in a lot of pain from surgery. He is down to one pill now. Please advise on refill  He has tried Tylenol and ibuprofen and it is not giving him any relief.   Pharmacy confirmed on file.

## 2021-02-09 NOTE — Telephone Encounter (Signed)
PDMP reviewed, he received 30 tablets about 10 days ago.  He has a history of EtOH.  Please call patient: While I am refilling his pain medication for 1 week, I am also concerned about his history of alcohol abuse.  He is at risk of abusing pain medications. After this week, if he is not better, he will probably need to be reassessed by surgery or refer to pain management.

## 2021-02-10 ENCOUNTER — Other Ambulatory Visit: Payer: Self-pay

## 2021-02-10 MED ORDER — ATROVENT HFA 17 MCG/ACT IN AERS: 2.0000 | INHALATION_SPRAY | RESPIRATORY_TRACT | 1 refills | Status: DC | PRN

## 2021-02-14 DIAGNOSIS — Z4801 Encounter for change or removal of surgical wound dressing: Secondary | ICD-10-CM | POA: Diagnosis not present

## 2021-02-14 DIAGNOSIS — E871 Hypo-osmolality and hyponatremia: Secondary | ICD-10-CM | POA: Diagnosis not present

## 2021-02-14 DIAGNOSIS — Z48815 Encounter for surgical aftercare following surgery on the digestive system: Secondary | ICD-10-CM | POA: Diagnosis not present

## 2021-02-14 DIAGNOSIS — K7460 Unspecified cirrhosis of liver: Secondary | ICD-10-CM | POA: Diagnosis not present

## 2021-02-16 ENCOUNTER — Telehealth: Payer: Self-pay | Admitting: Internal Medicine

## 2021-02-16 NOTE — Telephone Encounter (Signed)
Left message for patient to call back and schedule Medicare Annual Wellness Visit (AWV) in office.   If not able to come in office, please offer to do virtually or by telephone.  Left office number and my jabber #[phone].  Due for AWVI  Please schedule at anytime with Nurse Health Advisor.   

## 2021-02-21 DIAGNOSIS — E871 Hypo-osmolality and hyponatremia: Secondary | ICD-10-CM | POA: Diagnosis not present

## 2021-02-21 DIAGNOSIS — Z4801 Encounter for change or removal of surgical wound dressing: Secondary | ICD-10-CM | POA: Diagnosis not present

## 2021-02-21 DIAGNOSIS — K7460 Unspecified cirrhosis of liver: Secondary | ICD-10-CM | POA: Diagnosis not present

## 2021-02-21 DIAGNOSIS — Z48815 Encounter for surgical aftercare following surgery on the digestive system: Secondary | ICD-10-CM | POA: Diagnosis not present

## 2021-02-28 ENCOUNTER — Other Ambulatory Visit: Payer: Self-pay | Admitting: Internal Medicine

## 2021-02-28 DIAGNOSIS — E871 Hypo-osmolality and hyponatremia: Secondary | ICD-10-CM | POA: Diagnosis not present

## 2021-02-28 DIAGNOSIS — K7460 Unspecified cirrhosis of liver: Secondary | ICD-10-CM | POA: Diagnosis not present

## 2021-02-28 DIAGNOSIS — Z4801 Encounter for change or removal of surgical wound dressing: Secondary | ICD-10-CM | POA: Diagnosis not present

## 2021-02-28 DIAGNOSIS — Z48815 Encounter for surgical aftercare following surgery on the digestive system: Secondary | ICD-10-CM | POA: Diagnosis not present

## 2021-03-02 ENCOUNTER — Ambulatory Visit (INDEPENDENT_AMBULATORY_CARE_PROVIDER_SITE_OTHER): Payer: Medicare Other

## 2021-03-02 VITALS — Ht 73.0 in | Wt 194.0 lb

## 2021-03-02 DIAGNOSIS — Z0000 Encounter for general adult medical examination without abnormal findings: Secondary | ICD-10-CM | POA: Diagnosis not present

## 2021-03-02 NOTE — Progress Notes (Signed)
Subjective:   William Whitaker is a 60 y.o. male who presents for an Initial Medicare Annual Wellness Visit.  I connected with Cas today by telephone and verified that I am speaking with the correct person using two identifiers. Location patient: home Location provider: work Persons participating in the virtual visit: patient, Marine scientist.    I discussed the limitations, risks, security and privacy concerns of performing an evaluation and management service by telephone and the availability of in person appointments. I also discussed with the patient that there may be a patient responsible charge related to this service. The patient expressed understanding and verbally consented to this telephonic visit.    Interactive audio and video telecommunications were attempted between this provider and patient, however failed, due to patient having technical difficulties OR patient did not have access to video capability.  We continued and completed visit with audio only.  Some vital signs may be absent or patient reported.   Time Spent with patient on telephone encounter: 20 minutes   Review of Systems     Cardiac Risk Factors include: advanced age (>74mn, >>23women);male gender;smoking/ tobacco exposure     Objective:    Today's Vitals   03/02/21 1300  Weight: 194 lb (88 kg)  Height: '6\' 1"'$  (1.854 m)  PainSc: 4    Body mass index is 25.6 kg/m.  Advanced Directives 03/02/2021 03/05/2020 01/24/2019  Does Patient Have a Medical Advance Directive? No No No  Would patient like information on creating a medical advance directive? No - Patient declined No - Patient declined -    Current Medications (verified) Outpatient Encounter Medications as of 03/02/2021  Medication Sig   albuterol (VENTOLIN HFA) 108 (90 Base) MCG/ACT inhaler Inhale 2 puffs into the lungs every 6 (six) hours as needed for wheezing or shortness of breath.   benzonatate (TESSALON) 100 MG capsule Take 1 capsule (100 mg total) by  mouth 3 (three) times daily as needed for cough.   Cholecalciferol (VITAMIN D3) 50 MCG (2000 UT) TABS Take 1 tablet by mouth daily.   ferrous sulfate 325 (65 FE) MG tablet Take 1 tablet (325 mg total) by mouth daily with breakfast.   furosemide (LASIX) 20 MG tablet Take 1 tablet (20 mg total) by mouth daily.   hydrOXYzine (ATARAX/VISTARIL) 10 MG tablet Take 1 tablet (10 mg total) by mouth 3 (three) times daily as needed for anxiety.   ipratropium (ATROVENT HFA) 17 MCG/ACT inhaler Inhale 2 puffs into the lungs every 4 (four) hours as needed for wheezing.   ipratropium (ATROVENT) 0.02 % nebulizer solution Take 2.5 mLs (0.5 mg total) by nebulization 4 (four) times daily.   Multiple Vitamins-Minerals (ONE-A-DAY MENS VITACRAVES) CHEW Chew 1 tablet by mouth daily.   omeprazole (PRILOSEC) 20 MG capsule Take 1 capsule (20 mg total) by mouth daily.   potassium chloride SA (KLOR-CON M20) 20 MEQ tablet Take 1 tablet (20 mEq total) by mouth daily.   propranolol (INDERAL) 20 MG tablet TAKE 1 TABLET BY MOUTH TWICE A DAY   spironolactone (ALDACTONE) 50 MG tablet Take 1 tablet (50 mg total) by mouth daily.   topiramate (TOPAMAX) 25 MG tablet Take 1 tablet (25 mg total) by mouth daily.   vitamin C (ASCORBIC ACID) 250 MG tablet Take 250 mg by mouth daily.   Vitamin Mixture (VITAMIN E COMPLETE PO) Take by mouth.   HYDROcodone-acetaminophen (NORCO/VICODIN) 5-325 MG tablet Take 1 tablet by mouth 3 (three) times daily as needed. (Patient not taking: Reported on  03/02/2021)   Tiotropium Bromide Monohydrate (SPIRIVA RESPIMAT) 2.5 MCG/ACT AERS Inhale 2.5 mcg into the lungs daily. 2 puffs daily - through [employer] PAP (Patient not taking: No sig reported)   No facility-administered encounter medications on file as of 03/02/2021.    Allergies (verified) Patient has no known allergies.   History: Past Medical History:  Diagnosis Date   Anxiety    Bronchitis    GERD (gastroesophageal reflux disease)    Insomnia     Past Surgical History:  Procedure Laterality Date   APPENDECTOMY  01/12/2021   open   BIOPSY  01/27/2019   Procedure: BIOPSY;  Surgeon: Irving Copas., MD;  Location: [facility];  Service: Gastroenterology;;   COLONOSCOPY WITH PROPOFOL N/A 01/27/2019   Procedure: COLONOSCOPY WITH PROPOFOL;  Surgeon: Irving Copas., MD;  Location: [facility];  Service: Gastroenterology;  Laterality: N/A;   ESOPHAGOGASTRODUODENOSCOPY (EGD) WITH PROPOFOL N/A 01/27/2019   Procedure: ESOPHAGOGASTRODUODENOSCOPY (EGD) WITH PROPOFOL;  Surgeon: Rush Landmark Telford Nab., MD;  Location: [facility];  Service: Gastroenterology;  Laterality: N/A;   HERNIA REPAIR Right remotely as a baby   POLYPECTOMY  01/27/2019   Procedure: POLYPECTOMY;  Surgeon: Mansouraty, Telford Nab., MD;  Location: [facility];  Service: Gastroenterology;;   Family History  Problem Relation Age of Onset   Muscular dystrophy Mother    CAD Neg Hx    Diabetes Neg Hx    Cancer Neg Hx    Social History   Socioeconomic History   Marital status: Divorced    Spouse name: Not on file   Number of children: Not on file   Years of education: Not on file   Highest education level: Not on file  Occupational History   Occupation: disability d/t COPD  Tobacco Use   Smoking status: Every Day    Packs/day: 0.50    Years: 25.00    Pack years: 12.50    Types: Cigarettes   Smokeless tobacco: Never   Tobacco comments:    3 PPD, started to decrease to 1/2 ppd 2019, 2021 now smoking 1/4 ppd  Vaping Use   Vaping Use: Former  Substance and Sexual Activity   Alcohol use: Not Currently    Alcohol/week: 60.0 standard drinks    Types: 60 Cans of beer per week    Comment: denies on 08/06/2019   Drug use: No   Sexual activity: Not on file  Other Topics Concern   Not on file  Social History Narrative   Not on file   Social Determinants of Health   Financial Resource Strain: Medium Risk   Difficulty of Paying Living Expenses:  Somewhat hard  Food Insecurity: No Food Insecurity   Worried About Charity fundraiser in the Last Year: Never true   Ran Out of Food in the Last Year: Never true  Transportation Needs: No Transportation Needs   Lack of Transportation (Medical): No   Lack of Transportation (Non-Medical): No  Physical Activity: Inactive   Days of Exercise per Week: 0 days   Minutes of Exercise per Session: 0 min  Stress: No Stress Concern Present   Feeling of Stress : Not at all  Social Connections: Socially Isolated   Frequency of Communication with Friends and Family: More than three times a week   Frequency of Social Gatherings with Friends and Family: More than three times a week   Attends Religious Services: Never   Marine scientist or Organizations: No   Attends Archivist Meetings: Never  Marital Status: Divorced    Tobacco Counseling Ready to quit: Not Answered Counseling given: Not Answered Tobacco comments: 3 PPD, started to decrease to 1/2 ppd 2019, 2021 now smoking 1/4 ppd   Clinical Intake:  Pre-visit preparation completed: Yes  Pain : 0-10 Pain Score: 4  (pt recently had abdominal surgery) Pain Type: Acute pain Pain Location: Abdomen Pain Onset: 1 to 4 weeks ago Pain Frequency: Constant     BMI - recorded: 25.6 Nutritional Status: BMI 25 -29 Overweight Nutritional Risks: Unintentional weight loss (due to recent hospitalization & surgery) Diabetes: No  How often do you need to have someone help you when you read instructions, pamphlets, or other written materials from your doctor or pharmacy?: 1 - Never  Diabetic?No  Interpreter Needed?: No  Information entered by :: Caroleen Hamman LPN   Activities of Daily Living In your present state of health, do you have any difficulty performing the following activities: 03/02/2021  Hearing? N  Vision? N  Difficulty concentrating or making decisions? N  Walking or climbing stairs? N  Dressing or bathing? N   Doing errands, shopping? N  Preparing Food and eating ? N  Using the Toilet? N  In the past six months, have you accidently leaked urine? N  Do you have problems with loss of bowel control? N  Managing your Medications? N  Managing your Finances? N  Housekeeping or managing your Housekeeping? N  Some recent data might be hidden    Patient Care Team: Colon Branch, MD as PCP - General (Internal Medicine) Cherre Robins, PharmD (Pharmacist)  Indicate any recent Medical Services you may have received from other than [facility] providers in the past year (date may be approximate).     Assessment:   This is a routine wellness examination for [facility].  Hearing/Vision screen Hearing Screening - Comments:: c/o mild hearing loss Vision Screening - Comments:: Last eye exam-several years ago  Dietary issues and exercise activities discussed: Current Exercise Habits: Home exercise routine, Type of exercise: walking, Time (Minutes): 30, Frequency (Times/Week): 4, Weekly Exercise (Minutes/Week): 120, Intensity: Mild   Goals Addressed             This Visit's Progress    Patient Stated       Increase activity & eat healthier       Depression Screen PHQ 2/9 Scores 03/02/2021 01/31/2021 09/29/2020 01/24/2019  PHQ - 2 Score 0 0 2 4  PHQ- 9 Score - - 7 16    Fall Risk Fall Risk  03/02/2021 01/31/2021 09/29/2020 01/24/2019  Falls in the past year? 0 0 0 0  Number falls in past yr: 0 0 0 -  Injury with Fall? 0 0 0 -  Follow up Falls prevention discussed Falls evaluation completed Falls evaluation completed -    FALL RISK PREVENTION PERTAINING TO THE HOME:  Any stairs in or around the home? No  Home free of loose throw rugs in walkways, pet beds, electrical cords, etc? Yes  Adequate lighting in your home to reduce risk of falls? Yes   ASSISTIVE DEVICES UTILIZED TO PREVENT FALLS:  Life alert? No  Use of a cane, walker or w/c? Yes cane-due to recent surgery Grab bars in the bathroom? No  Shower  chair or bench in shower? No  Elevated toilet seat or a handicapped toilet? No   TIMED UP AND GO:  Was the test performed? No . Phone visit   Cognitive Function:Normal cognitive status assessed by this Nurse Health  Advisor. No abnormalities found.          Immunizations Immunization History  Administered Date(s) Administered   Hep A / Hep B 06/30/2019   Hepatitis A, Adult 04/21/2019   Hepatitis B, ped/adol 03/11/2019   Influenza Inj Mdck Quad Pf 04/10/2019   Janssen (J&J) SARS-COV-2 Vaccination 02/04/2020    TDAP status: Due, Education has been provided regarding the importance of this vaccine. Advised may receive this vaccine at local pharmacy or Health Dept. Aware to provide a copy of the vaccination record if obtained from local pharmacy or Health Dept. Verbalized acceptance and understanding.  Flu Vaccine status: Due, Education has been provided regarding the importance of this vaccine. Advised may receive this vaccine at local pharmacy or Health Dept. Aware to provide a copy of the vaccination record if obtained from local pharmacy or Health Dept. Verbalized acceptance and understanding.  Pneumococcal vaccine status: Due, Education has been provided regarding the importance of this vaccine. Advised may receive this vaccine at local pharmacy or Health Dept. Aware to provide a copy of the vaccination record if obtained from local pharmacy or Health Dept. Verbalized acceptance and understanding.  Covid-19 vaccine status: Information provided on how to obtain vaccines. Booster due  Qualifies for Shingles Vaccine? Yes   Zostavax completed No   Shingrix Completed?: No.    Education has been provided regarding the importance of this vaccine. Patient has been advised to call insurance company to determine out of pocket expense if they have not yet received this vaccine. Advised may also receive vaccine at local pharmacy or Health Dept. Verbalized acceptance and  understanding.  Screening Tests Health Maintenance  Topic Date Due   Pneumococcal Vaccine 58-65 Years old (1 - PCV) Never done   TETANUS/TDAP  Never done   Zoster Vaccines- Shingrix (1 of 2) Never done   COVID-19 Vaccine (2 - Booster for Janssen series) 03/31/2020   INFLUENZA VACCINE  01/31/2021   COLONOSCOPY (Pts 45-63yr Insurance coverage will need to be confirmed)  01/26/2029   Hepatitis C Screening  Completed   HIV Screening  Completed   HPV VACCINES  Aged Out    Health Maintenance  Health Maintenance Due  Topic Date Due   Pneumococcal Vaccine 06589Years old (1 - PCV) Never done   TETANUS/TDAP  Never done   Zoster Vaccines- Shingrix (1 of 2) Never done   COVID-19 Vaccine (2 - Booster for Janssen series) 03/31/2020   INFLUENZA VACCINE  01/31/2021    Colorectal cancer screening: Type of screening: Colonoscopy. Completed 01/27/2019. Repeat every 5-7 years  Lung Cancer Screening: (Low Dose CT Chest recommended if Age 61-80years, 30 pack-year currently smoking OR have quit w/in 15years.) does not qualify.     Additional Screening:  Hepatitis C Screening: Completed 06/01/2020  Vision Screening: Recommended annual ophthalmology exams for early detection of glaucoma and other disorders of the eye. Is the patient up to date with their annual eye exam?  No  Who is the provider or what is the name of the office in which the patient attends annual eye exams? [facility] Patient plans to make an appt soon  Dental Screening: Recommended annual dental exams for proper oral hygiene  Community Resource Referral / Chronic Care Management: CRR required this visit?  No   CCM required this visit?  No      Plan:     I have personally reviewed and noted the following in the patient's chart:   Medical and social history Use  of alcohol, tobacco or illicit drugs  Current medications and supplements including opioid prescriptions. Patient is not currently taking opioid  prescriptions. Functional ability and status Nutritional status Physical activity Advanced directives List of other physicians Hospitalizations, surgeries, and ER visits in previous 12 months Vitals Screenings to include cognitive, depression, and falls Referrals and appointments  In addition, I have reviewed and discussed with patient certain preventive protocols, quality metrics, and best practice recommendations. A written personalized care plan for preventive services as well as general preventive health recommendations were provided to patient.   Due to this being a telephonic visit, the after visit summary with patients personalized plan was offered to patient via mail or my-chart. per request, patient was mailed a copy of AVS./   Marta Antu, LPN   [id number]  Nurse Health Advisor  Nurse Notes: None

## 2021-03-02 NOTE — Patient Instructions (Signed)
[name] , Thank you for taking time to complete your Medicare Wellness Visit. I appreciate your ongoing commitment to your health goals. Please review the following plan we discussed and let me know if I can assist you in the future.   Screening recommendations/referrals: Colonoscopy: Completed 01/27/2019-Due-01/27/2024 Recommended yearly ophthalmology/optometry visit for glaucoma screening and checkup Recommended yearly dental visit for hygiene and checkup  Vaccinations: Influenza vaccine: Due-03/2021 Pneumococcal vaccine: Due-Discuss with PCP or Pulmonary doctor Tdap vaccine: Discuss with pharmacy Shingles vaccine: Per our conversation-not indicated since you never had chicken pox   Covid-19: Booster due  Advanced directives: Declined information today  Conditions/risks identified: See problem list  Next appointment: Follow up in one year for your annual wellness visit   Preventive Care 40-64 Years, Male Preventive care refers to lifestyle choices and visits with your health care provider that can promote health and wellness. What does preventive care include? A yearly physical exam. This is also called an annual well check. Dental exams once or twice a year. Routine eye exams. Ask your health care provider how often you should have your eyes checked. Personal lifestyle choices, including: Daily care of your teeth and gums. Regular physical activity. Eating a healthy diet. Avoiding tobacco and drug use. Limiting alcohol use. Practicing safe sex. Taking low-dose aspirin every day starting at age 92. What happens during an annual well check? The services and screenings done by your health care provider during your annual well check will depend on your age, overall health, lifestyle risk factors, and family history of disease. Counseling  Your health care provider may ask you questions about your: Alcohol use. Tobacco use. Drug use. Emotional well-being. Home and relationship  well-being. Sexual activity. Eating habits. Work and work Statistician. Screening  You may have the following tests or measurements: Height, weight, and BMI. Blood pressure. Lipid and cholesterol levels. These may be checked every 5 years, or more frequently if you are over 44 years old. Skin check. Lung cancer screening. You may have this screening every year starting at age 72 if you have a 30-pack-year history of smoking and currently smoke or have quit within the past 15 years. Fecal occult blood test (FOBT) of the stool. You may have this test every year starting at age 81. Flexible sigmoidoscopy or colonoscopy. You may have a sigmoidoscopy every 5 years or a colonoscopy every 10 years starting at age 30. Prostate cancer screening. Recommendations will vary depending on your family history and other risks. Hepatitis C blood test. Hepatitis B blood test. Sexually transmitted disease (STD) testing. Diabetes screening. This is done by checking your blood sugar (glucose) after you have not eaten for a while (fasting). You may have this done every 1-3 years. Discuss your test results, treatment options, and if necessary, the need for more tests with your health care provider. Vaccines  Your health care provider may recommend certain vaccines, such as: Influenza vaccine. This is recommended every year. Tetanus, diphtheria, and acellular pertussis (Tdap, Td) vaccine. You may need a Td booster every 10 years. Zoster vaccine. You may need this after age 61. Pneumococcal 13-valent conjugate (PCV13) vaccine. You may need this if you have certain conditions and have not been vaccinated. Pneumococcal polysaccharide (PPSV23) vaccine. You may need one or two doses if you smoke cigarettes or if you have certain conditions. Talk to your health care provider about which screenings and vaccines you need and how often you need them. This information is not intended to replace advice given  to you by your  health care provider. Make sure you discuss any questions you have with your health care provider. Document Released: 07/16/2015 Document Revised: 03/08/2016 Document Reviewed: 04/20/2015 Elsevier Interactive Patient Education  2017 [place] Prevention in the Home Falls can cause injuries. They can happen to people of all ages. There are many things you can do to make your home safe and to help prevent falls. What can I do on the outside of my home? Regularly fix the edges of walkways and driveways and fix any cracks. Remove anything that might make you trip as you walk through a door, such as a raised step or threshold. Trim any bushes or trees on the path to your home. Use bright outdoor lighting. Clear any walking paths of anything that might make someone trip, such as rocks or tools. Regularly check to see if handrails are loose or broken. Make sure that both sides of any steps have handrails. Any raised decks and porches should have guardrails on the edges. Have any leaves, snow, or ice cleared regularly. Use sand or salt on walking paths during winter. Clean up any spills in your garage right away. This includes oil or grease spills. What can I do in the bathroom? Use night lights. Install grab bars by the toilet and in the tub and shower. Do not use towel bars as grab bars. Use non-skid mats or decals in the tub or shower. If you need to sit down in the shower, use a plastic, non-slip stool. Keep the floor dry. Clean up any water that spills on the floor as soon as it happens. Remove soap buildup in the tub or shower regularly. Attach bath mats securely with double-sided non-slip rug tape. Do not have throw rugs and other things on the floor that can make you trip. What can I do in the bedroom? Use night lights. Make sure that you have a light by your bed that is easy to reach. Do not use any sheets or blankets that are too big for your bed. They should not hang down  onto the floor. Have a firm chair that has side arms. You can use this for support while you get dressed. Do not have throw rugs and other things on the floor that can make you trip. What can I do in the kitchen? Clean up any spills right away. Avoid walking on wet floors. Keep items that you use a lot in easy-to-reach places. If you need to reach something above you, use a strong step stool that has a grab bar. Keep electrical cords out of the way. Do not use floor polish or wax that makes floors slippery. If you must use wax, use non-skid floor wax. Do not have throw rugs and other things on the floor that can make you trip. What can I do with my stairs? Do not leave any items on the stairs. Make sure that there are handrails on both sides of the stairs and use them. Fix handrails that are broken or loose. Make sure that handrails are as long as the stairways. Check any carpeting to make sure that it is firmly attached to the stairs. Fix any carpet that is loose or worn. Avoid having throw rugs at the top or bottom of the stairs. If you do have throw rugs, attach them to the floor with carpet tape. Make sure that you have a light switch at the top of the stairs and the bottom of the  stairs. If you do not have them, ask someone to add them for you. What else can I do to help prevent falls? Wear shoes that: Do not have high heels. Have rubber bottoms. Are comfortable and fit you well. Are closed at the toe. Do not wear sandals. If you use a stepladder: Make sure that it is fully opened. Do not climb a closed stepladder. Make sure that both sides of the stepladder are locked into place. Ask someone to hold it for you, if possible. Clearly mark and make sure that you can see: Any grab bars or handrails. First and last steps. Where the edge of each step is. Use tools that help you move around (mobility aids) if they are needed. These include: Canes. Walkers. Scooters. Crutches. Turn  on the lights when you go into a dark area. Replace any light bulbs as soon as they burn out. Set up your furniture so you have a clear path. Avoid moving your furniture around. If any of your floors are uneven, fix them. If there are any pets around you, be aware of where they are. Review your medicines with your doctor. Some medicines can make you feel dizzy. This can increase your chance of falling. Ask your doctor what other things that you can do to help prevent falls. This information is not intended to replace advice given to you by your health care provider. Make sure you discuss any questions you have with your health care provider. Document Released: 04/15/2009 Document Revised: 11/25/2015 Document Reviewed: 07/24/2014 Elsevier Interactive Patient Education  2017 [employer].

## 2021-03-11 ENCOUNTER — Telehealth: Payer: Self-pay | Admitting: Pharmacist

## 2021-03-11 NOTE — Telephone Encounter (Signed)
Appeal for manufacturer assistance program was denied for Spiriva. Patient has Part D insurance with [employer] [phone]). Spiriva is covered with $47 per month copay but he has a $95 deductible to meet and first fill of Spiriva will be $142. Refills after that will be $47. Patient is aware.

## 2021-03-23 ENCOUNTER — Other Ambulatory Visit: Payer: Self-pay | Admitting: Internal Medicine

## 2021-03-24 ENCOUNTER — Ambulatory Visit (INDEPENDENT_AMBULATORY_CARE_PROVIDER_SITE_OTHER): Payer: Medicare Other | Admitting: Pharmacist

## 2021-03-24 DIAGNOSIS — K7031 Alcoholic cirrhosis of liver with ascites: Secondary | ICD-10-CM

## 2021-03-24 DIAGNOSIS — J449 Chronic obstructive pulmonary disease, unspecified: Secondary | ICD-10-CM

## 2021-03-24 DIAGNOSIS — F17200 Nicotine dependence, unspecified, uncomplicated: Secondary | ICD-10-CM

## 2021-03-27 NOTE — Patient Instructions (Signed)
Visit Information  PATIENT GOALS:  Goals Addressed             This Visit's Progress    Chronic Care Management Pharmacy Care Plan       CARE PLAN ENTRY (see longitudinal plan of care for additional care plan information)  Current Barriers:  Chronic Disease Management support, education, and care coordination needs related to COPD/Tobacco Use Disorder, Cirrhosis, Hep C, Anxiety   COPD/Tobacco Use Disorder Pharmacist Clinical Goal(s) Over the next 90 days, patient will work with PharmD and providers to create an affordable treatment plan for his inhaler regimen Over the next 90 days, patient will decrease/eliminate his use of cigarettes Current regimen:  Spiriva Respimat 2.38mcg 2 puffs daily Albuterol inhaler - 2 puffs every 6 hours as needed Ipratropium 0.02% nebulized solution or inhaler up to 4 times a day as needed Interventions:  Completed [employer] patient assistance application, signed by PCP and faxed to [employer] (initial application was denied)  Plan to appeal decision as patient income was just slightly above cut offi. Patient self care activities - Over the next 90 days, patient will: Remain adherent to inhaler regimen Decrease cigarette smoking by at least 1-2 cigarettes per day Notify office once you receive Spiriva delivery  Anxiety Pharmacist Clinical Goal(s) Over the next 90 days, patient will work with PharmD and providers to reduce symptoms/limitations of anxiety Current regimen:  Hydroxyzine 10mg  3 times a day as needed for anxiety or sleep.  Patient self care activities - Over the next 90 days, patient will: Continue hydroxyzine for anxiety  Health Maintenance:  Pharmacist Clinical Goal(s) Over the next 90 days, patient will work with PharmD and providers to maintain health Interventions: Reviewed vaccination records Discussed needed vaccines Patient self care activities - Over the next 30 days, patient will: Get annual flu vaccine Get  updated COVID vaccine  Medication management Pharmacist Clinical Goal(s): Over the next 90 days, patient will work with PharmD and providers to maintain optimal medication adherence Current pharmacy: [facility] Interventions Comprehensive medication review performed. Continue current medication management strategy Patient self care activities - Over the next 90 days, patient will: Focus on medication adherence by filling medications appropriately Take medications as prescribed Report any questions or concerns to PharmD and/or provider(s)  Please see past updates related to this goal by clicking on the "Past Updates" button in the selected goal          The patient verbalized understanding of instructions, educational materials, and care plan provided today and declined offer to receive copy of patient instructions, educational materials, and care plan.   Telephone follow up appointment with care management team member scheduled for: 3 to 4 months  Cherre Robins, PharmD Clinical Pharmacist [place] [facility]

## 2021-03-27 NOTE — Chronic Care Management (AMB) (Signed)
Chronic Care Management Pharmacy Note  03/27/2021 Name:  William Whitaker MRN:  [id number] DOB:  March 21, 1961  Summary: William Whitaker to have difficulty affording COPD therapy which has lead to low adherence. Was denied by [facility] program to receive PAP for Spiriva.  Patient has ipratropium / albuterol for nebulizer and can afford this.   Recommendations/Changes made from today's visit: Reviewed his Medicare plan  - patient has deductible of $95 to meet for all brand name medications. Then cost will be $47/month. Recommended to use ipratropium / albuterol in nebulizer up to 4 times a day  Subjective: William Whitaker is an 60 y.o. year old male who is a primary patient of Paz, Alda Berthold, MD.  The CCM team was consulted for assistance with disease management and care coordination needs.    Engaged with patient by telephone for follow up visit in response to provider referral for pharmacy case management and/or care coordination services.   Consent to Services:  The patient was given information about Chronic Care Management services, agreed to services, and gave verbal consent prior to initiation of services.  Please see initial visit note for detailed documentation.   Patient Care Team: Colon Branch, MD as PCP - General (Internal Medicine) Cherre Robins, PharmD (Pharmacist)  Recent office visits: 01/31/2021 - PCP (Dr William Whitaker) F/U perforated appendix / hernia repair. Refill hydrocodone / acetaminophen for pain. Referred to pulmonology due to new finding of nodule on lung CT.   09/29/20-William William Kells, MD(PCP) F/U chronic conditions. Aspirin removed for med list (patient not taking); Checked labs - CMP, A1c, CBC, ferritin, TSH.   Recent consult visits: 03/01/2021 - [facility] s/p appendectomy - wound check. Daily fiber supplementation, adequate hydration, and frequent ambulation were recommended.  02/15/2021 - [facility] ([facility]) post-op appendectomy. Wound  progressing well and has some hypergranulation that we treated with silver nitrate. Will change to mupirocin and dry dressing daily.  02/01/2021 - [facility] ([facility]) post-op appendectomy wound check. Continue WTD dressing changes daily. Discussed signs and symptoms of infection and patient knows to contact our clinic should he experience any of these.   Hospital visits: 01/12/2021- to 01/23/2021 - Admitted to [facility] for perforated appendicitis and primary repair of umbilical hernia. Also had post op ileus.  New medications at discharge: Hydrocodone / acetaminophen 5/334m every 6 hours as needed for up to 5 days.    Objective:  Lab Results  Component Value Date   CREATININE 0.71 01/31/2021   CREATININE 0.78 09/29/2020   CREATININE 0.77 03/05/2020    Lab Results  Component Value Date   HGBA1C 5.3 09/29/2020   Last diabetic Eye exam: No results found for: HMDIABEYEEXA  Last diabetic Foot exam: No results found for: HMDIABFOOTEX   No results found for: CHOL, TRIG, HDL, CHOLHDL, VLDL, LDLCALC, LDLDIRECT  Hepatic Function Latest Ref Rng & Units 01/31/2021 09/29/2020 03/05/2020  Total Protein 6.0 - 8.3 g/dL 7.7 8.0 7.8  Albumin 3.5 - 5.2 g/dL 4.0 4.4 4.1  AST 0 - 37 U/L 29 23 36  ALT 0 - 53 U/L 21 13 15   Alk Phosphatase 39 - 117 U/L 99 82 62  Total Bilirubin 0.2 - 1.2 mg/dL 0.6 0.7 0.8    Lab Results  Component Value Date/Time   TSH 1.46 09/29/2020 02:59 PM   TSH 3.002 01/25/2019 05:44 AM    CBC Latest Ref Rng & Units 01/31/2021 09/29/2020 03/05/2020  WBC 4.0 - 10.5 K/uL 5.7 6.6 4.7  Hemoglobin 13.0 - 17.0 g/dL 12.2(L) 14.4 9.0(L)  Hematocrit 39.0 - 52.0 % 36.9(L) 42.8 32.4(L)  Platelets 150.0 - 400.0 K/uL 223.0 143.0(L) 202    No results found for: VD25OH  Clinical ASCVD: No  The ASCVD Risk score (Arnett DK, et al., 2019) failed to calculate for the following reasons:   Cannot find a previous HDL lab   Cannot find a previous  total cholesterol lab     Social History   Tobacco Use  Smoking Status Every Day   Packs/day: 0.50   Years: 25.00   Pack years: 12.50   Types: Cigarettes  Smokeless Tobacco Never  Tobacco Comments   3 PPD, started to decrease to 1/2 ppd 2019, 2021 now smoking 1/4 ppd   BP Readings from Last 3 Encounters:  01/31/21 124/80  09/29/20 (!) 144/70  06/01/20 138/74   Pulse Readings from Last 3 Encounters:  01/31/21 93  09/29/20 78  06/01/20 80   Wt Readings from Last 3 Encounters:  03/02/21 194 lb (88 kg)  01/31/21 194 lb 2 oz (88.1 kg)  09/29/20 203 lb 6 oz (92.3 kg)    Assessment: Review of patient past medical history, allergies, medications, health status, including review of consultants reports, laboratory and other test data, was performed as part of comprehensive evaluation and provision of chronic care management services.   SDOH:  (Social Determinants of Health) assessments and interventions performed:    CCM Care Plan  No Known Allergies  Medications Reviewed Today     Reviewed by Cherre Robins, PharmD (Pharmacist) on 03/24/21 at 1341  Med List Status: <None>   Medication Order Taking? Sig Documenting Provider Last Dose Status Informant  albuterol (VENTOLIN HFA) 108 (90 Base) MCG/ACT inhaler [id number] Yes Inhale 2 puffs into the lungs every 6 (six) hours as needed for wheezing or shortness of breath. Colon Branch, MD Taking Active   benzonatate (TESSALON) 100 MG capsule [id number] Yes Take 1 capsule (100 mg total) by mouth 3 (three) times daily as needed for cough. Colon Branch, MD Taking Active   Cholecalciferol (VITAMIN D3) 50 MCG (2000 UT) TABS [id number] Yes Take 1 tablet by mouth daily. [provider] Taking Active   ferrous sulfate 325 (65 FE) MG tablet [id number] Yes Take 1 tablet (325 mg total) by mouth daily with breakfast. Colon Branch, MD Taking Active   furosemide (LASIX) 20 MG tablet [id number] Yes Take 1 tablet (20 mg total) by mouth daily. Colon Branch, MD Taking Active   HYDROcodone-acetaminophen (NORCO/VICODIN) 5-325 MG tablet [id number] Yes Take 1 tablet by mouth 3 (three) times daily as needed. Colon Branch, MD Taking Active   hydrOXYzine (ATARAX/VISTARIL) 10 MG tablet [id number] Yes Take 1 tablet (10 mg total) by mouth 3 (three) times daily as needed for anxiety. Colon Branch, MD Taking Active   ipratropium (ATROVENT HFA) 17 MCG/ACT inhaler [id number] No Inhale 2 puffs into the lungs every 4 (four) hours as needed for wheezing.  Patient not taking: Reported on 03/24/2021   Colon Branch, MD Not Taking Consider Medication Status and Discontinue   ipratropium (ATROVENT) 0.02 % nebulizer solution [id number] No Take 2.5 mLs (0.5 mg total) by nebulization 4 (four) times daily.  Patient not taking: Reported on 03/24/2021   Colon Branch, MD Not Taking Active   Magnesium 250 MG TABS [id number] Yes Take 1 tablet by mouth daily. [provider] Taking Active   Multiple  Vitamins-Minerals (ONE-A-DAY MENS VITACRAVES) CHEW [id number] Yes Chew 1 tablet by mouth daily. [provider] Taking Active Self  omeprazole (PRILOSEC) 20 MG capsule [id number] Yes Take 1 capsule (20 mg total) by mouth daily. Colon Branch, MD Taking Active   potassium chloride SA (KLOR-CON M20) 20 MEQ tablet [id number] Yes Take 1 tablet (20 mEq total) by mouth daily. Colon Branch, MD Taking Active   propranolol (INDERAL) 20 MG tablet [id number] Yes TAKE 1 TABLET BY MOUTH TWICE A DAY Colon Branch, MD Taking Active   spironolactone (ALDACTONE) 50 MG tablet [id number] Yes TAKE 1 TABLET BY MOUTH EVERY DAY Colon Branch, MD Taking Active   Tiotropium Bromide Monohydrate (SPIRIVA RESPIMAT) 2.5 MCG/ACT AERS [id number]  Inhale 2.5 mcg into the lungs daily. 2 puffs daily [provider]  Consider Medication Status and Discontinue   topiramate (TOPAMAX) 25 MG tablet [id number] Yes Take 1 tablet (25 mg total) by mouth daily. Colon Branch, MD Taking Active   vitamin C (ASCORBIC ACID) 250  MG tablet [id number] Yes Take 250 mg by mouth daily. [provider] Taking Active Self  Vitamin Mixture (VITAMIN E COMPLETE PO) [id number] Yes Take by mouth. [provider] Taking Active             Patient Active Problem List   Diagnosis Date Noted   Insomnia 09/29/2020   COPD (chronic obstructive pulmonary disease) ([place]) 04/09/2019   PCP NOTES >>>>>>>>>>>>>>>>>> 02/05/2019   Chronic hepatitis ([place]) 02/05/2019   EtOH dependence ([place]) 01/25/2019   Tobacco dependence 72/62/0355   Alcoholic cirrhosis of liver with ascites ([place]) 01/25/2019   Hyponatremia 01/25/2019   Iron deficiency anemia    Symptomatic anemia 01/24/2019   Anxiety 08/20/2007   REACTIVE AIRWAY DISEASE 08/20/2007   G E R D 08/20/2007    Immunization History  Administered Date(s) Administered   Hep A / Hep B 06/30/2019   Hepatitis A, Adult 04/21/2019   Hepatitis B, ped/adol 03/11/2019   Influenza Inj Mdck Quad Pf 04/10/2019   Janssen (J&J) SARS-COV-2 Vaccination 02/04/2020    Conditions to be addressed/monitored: COPD, Anxiety, and tobacco use disorder and cirrhosis  Care Plan : General Pharmacy (Adult)  Updates made by Cherre Robins, PHARMD since 03/27/2021 12:00 AM     Problem: COPD, Anxiety, Cirrhosis   Priority: High  Onset Date: 08/19/2020     Long-Range Goal: Patient-Specific Goal   Start Date: 08/19/2020  Expected End Date: 02/16/2021  Recent Progress: On track  Priority: High  Note:   Current Barriers:  Unable to independently afford treatment regimen Not taking Spiriva (maintenance inhaler) or using Atrovent inhaler due to cost  Pharmacist Clinical Goal(s):  Over the next 90 days, patient will verbalize ability to afford treatment regimen achieve adherence to monitoring guidelines and medication adherence to achieve therapeutic efficacy maintain control of COPD as evidenced by exacerbation frequency  through collaboration with PharmD and provider.   Interventions: 1:1  collaboration with Colon Branch, MD regarding development and update of comprehensive plan of care as evidenced by provider attestation and co-signature Inter-disciplinary care team collaboration (see longitudinal plan of care) Comprehensive medication review performed; medication list updated in electronic medical record  COPD/Tobacco (Goal: control symptoms and prevent exacerbations) Controlled / no recent COPD exacerbations in last year that required treatment Current COPD Classification:  B (high sx, <2 exacerbations/yr) Current therapy:  Alubterol HFA - inhale 2 puffs up to every 6 hours as needed Atrovent (ipratropium) HFA - inhaler 2 puffs every 4  hours as needed Atorvent (ipratropium) nebules - use 1 ampule in nebulizer 4 times a day as needed Spiriva respimat inhaler - 2 puffs daily Cost of medications for COPD has been barrier to adherence. With William his cost was $148 for Spriva. Maretta Whitaker and Atrovent HFA cost the patient over $200 after insurance.  Patient reports he uses albuterol resuce inhaler about every other day  Patient applied for patient assistance for Spiriva but was denied - income was about $600 over limit. Tried to appeal [employer] PAP decision but was denied a 2nd time.  Interventions:  Recommended patient use ipratropium nebules twice a day  - cost from mail order is $0  Continue to use albuterol HFA as needed.   Tobacco Use: Continues to smoke 1/2 PPD William Whitaker reports he is not ready to quit smoking at this time.  First cigarette occurs in the afternoon.  Has tried patches before and tapering number of cigarettes per day - not successful Current treatment  none Intervention:  Will continue to assess patients readiness for smoking cessation  Depression/Anxiety (Goal: Minimize symptoms) Improving Current treatment: Hydroxyzine 2m 3 times a day as needed Medications previously tried/failed: none noted Reports he takes prn for sleep or if he needs to  during the day. Interventions:  None today - has been addressed at previous visits Recommended to continue current medication  Cirrhosis/Alcohol use disorder (Goal: Maintain) Controlled Managed by GI - Dr CBryan LemmaCurrent treatment  Furosemide 262mdaily Potassium 2011mdaily Omeprazole 60m76mily Propranolol 60mg59mce daily Spironolactone 50mg 50my Topiramate 25mg d3m Medications previously tried: none noted  Intervention:  Recommended to continue current medication  Anemia:  Last H/H was 12.2 / 36.9 (slightly low) Current therapy:  Ferrous sulfate 325mg da58mwith breakfast Interventions:  Continue current therapy   Health Maintenance:  Interventions: Reviewed vaccination records Discussed needed vaccines Patient self care activities - Over the next 30 days, patient will: Get annual flu vaccine Get updated COVID vaccine  Patient Goals/Self-Care Activities Over the next 90 days, patient will:  take medications as prescribed focus on medication adherence by pill count collaborate with provider on medication access solutions  Follow Up Plan: Clinical pharmacist follow up in 3 to 4 months.        Medication Assistance:  Denied by [employer] for Spiriva - income about $600 above cut off. Appeal was tried and denied also.  [id number] [phone] #[id number] - [id number], [place] - [phone] Mail Service  [facility] [phone] Up:  Patient agrees to Care Plan and Follow-up.  Plan:  pharmacist follow up  in 3 to 4 months.   Vibha Ferdig EckCherre RobinsClinical Pharmacist [place] [phone]

## 2021-04-01 DIAGNOSIS — J449 Chronic obstructive pulmonary disease, unspecified: Secondary | ICD-10-CM

## 2021-05-04 ENCOUNTER — Telehealth: Payer: Self-pay

## 2021-05-04 NOTE — Chronic Care Management (AMB) (Signed)
Chronic Care Management Pharmacy Assistant   Name: William Whitaker  MRN: [id number] DOB: 1960/09/24  Reason for Encounter: Disease State COPD   Recent office visits:  None noted   Recent consult visits:  None noted   Hospital visits:  None in previous 6 months  Medications: Outpatient Encounter Medications as of 05/04/2021  Medication Sig   albuterol (VENTOLIN HFA) 108 (90 Base) MCG/ACT inhaler Inhale 2 puffs into the lungs every 6 (six) hours as needed for wheezing or shortness of breath.   benzonatate (TESSALON) 100 MG capsule Take 1 capsule (100 mg total) by mouth 3 (three) times daily as needed for cough.   Cholecalciferol (VITAMIN D3) 50 MCG (2000 UT) TABS Take 1 tablet by mouth daily.   ferrous sulfate 325 (65 FE) MG tablet Take 1 tablet (325 mg total) by mouth daily with breakfast.   furosemide (LASIX) 20 MG tablet Take 1 tablet (20 mg total) by mouth daily.   HYDROcodone-acetaminophen (NORCO/VICODIN) 5-325 MG tablet Take 1 tablet by mouth 3 (three) times daily as needed.   hydrOXYzine (ATARAX/VISTARIL) 10 MG tablet Take 1 tablet (10 mg total) by mouth 3 (three) times daily as needed for anxiety.   ipratropium (ATROVENT HFA) 17 MCG/ACT inhaler Inhale 2 puffs into the lungs every 4 (four) hours as needed for wheezing. (Patient not taking: Reported on 03/24/2021)   ipratropium (ATROVENT) 0.02 % nebulizer solution Take 2.5 mLs (0.5 mg total) by nebulization 4 (four) times daily. (Patient not taking: Reported on 03/24/2021)   Magnesium 250 MG TABS Take 1 tablet by mouth daily.   Multiple Vitamins-Minerals (ONE-A-DAY MENS VITACRAVES) CHEW Chew 1 tablet by mouth daily.   omeprazole (PRILOSEC) 20 MG capsule Take 1 capsule (20 mg total) by mouth daily.   potassium chloride SA (KLOR-CON M20) 20 MEQ tablet Take 1 tablet (20 mEq total) by mouth daily.   propranolol (INDERAL) 20 MG tablet TAKE 1 TABLET BY MOUTH TWICE A DAY   spironolactone (ALDACTONE) 50 MG tablet TAKE 1 TABLET BY MOUTH  EVERY DAY   Tiotropium Bromide Monohydrate (SPIRIVA RESPIMAT) 2.5 MCG/ACT AERS Inhale 2.5 mcg into the lungs daily. 2 puffs daily   topiramate (TOPAMAX) 25 MG tablet Take 1 tablet (25 mg total) by mouth daily.   vitamin C (ASCORBIC ACID) 250 MG tablet Take 250 mg by mouth daily.   Vitamin Mixture (VITAMIN E COMPLETE PO) Take by mouth.   No facility-administered encounter medications on file as of 05/04/2021.   Current COPD regimen:  Alubterol HFA - inhale 2 puffs up to every 6 hours as needed Atrovent (ipratropium) HFA - inhaler 2 puffs every 4 hours as needed Atorvent (ipratropium) nebules - use 1 ampule in nebulizer 4 times a day as needed Spiriva respimat inhaler - 2 puffs daily No flowsheet data found. Any recent hospitalizations or ED visits since last visit with CPP? No denies COPD symptoms, including Increased shortness of breath , Rescue medicine is not helping, Shortness of breath at rest, Symptoms worse with exercise, Symptoms worse at night, and Wheezing What recent interventions/DTPs have been made by any provider to improve breathing since last visit: Have you had exacerbation/flare-up since last visit? No What do you do when you are short of breath?  Rescue medication  Respiratory Devices/Equipment Do you have a nebulizer? Yes Do you use a Peak Flow Meter? No Do you use a maintenance inhaler? Yes How often do you forget to use your daily inhaler? Never  Do you use a rescue inhaler?  Yes How often do you use your rescue inhaler?  daily, Patient states that he uses it when he experiences coughing fits.  Do you use a spacer with your inhaler? No  Adherence Review: Does the patient have >5 day gap between last estimated fill date for maintenance inhaler medications? No      Star Rating Drugs: None    Andee Poles, CMA

## 2021-06-02 ENCOUNTER — Other Ambulatory Visit: Payer: Self-pay | Admitting: Internal Medicine

## 2021-06-16 ENCOUNTER — Other Ambulatory Visit: Payer: Self-pay | Admitting: Internal Medicine

## 2021-07-05 ENCOUNTER — Telehealth: Payer: Medicare Other

## 2021-07-14 ENCOUNTER — Telehealth: Payer: Self-pay | Admitting: Pharmacist

## 2021-07-14 ENCOUNTER — Telehealth: Payer: Medicare HMO

## 2021-07-14 NOTE — Telephone Encounter (Signed)
°  Care Management   Follow Up Note   07/14/2021 Name: William Whitaker MRN: [id number] DOB: 12-24-1960   Referred by: Colon Branch, MD Reason for referral : No chief complaint on file.   An unsuccessful telephone outreach was attempted today. The patient was referred to the case management team for assistance with care management and care coordination.   Follow Up Plan: The care management team will reach out to the patient again over the next 30 days.   Cherre Robins, PharmD Clinical Pharmacist [facility]

## 2021-07-15 NOTE — Chronic Care Management (AMB) (Signed)
°  Care Management   Note  07/15/2021 Name: William Whitaker MRN: [id number] DOB: [birth year]  William Whitaker is a 61 y.o. year old male who is a primary care patient of Colon Branch, MD and is actively engaged with the care management team. I reached out to [name] by phone today to assist with re-scheduling a follow up visit with the Pharmacist  Follow up plan: Telephone appointment with care management team member scheduled for: 07/29/2021  Julian Hy, [place], [place] [facility]  Direct Dial: [phone]

## 2021-07-27 ENCOUNTER — Other Ambulatory Visit: Payer: Self-pay | Admitting: Internal Medicine

## 2021-07-28 ENCOUNTER — Telehealth: Payer: Self-pay | Admitting: Internal Medicine

## 2021-07-28 NOTE — Telephone Encounter (Signed)
LVM for patient to call back and schedule an overdue follow up visit with Dr. Larose Kells.

## 2021-07-29 ENCOUNTER — Ambulatory Visit (INDEPENDENT_AMBULATORY_CARE_PROVIDER_SITE_OTHER): Payer: Medicare HMO | Admitting: Pharmacist

## 2021-07-29 DIAGNOSIS — F419 Anxiety disorder, unspecified: Secondary | ICD-10-CM

## 2021-07-29 DIAGNOSIS — F17200 Nicotine dependence, unspecified, uncomplicated: Secondary | ICD-10-CM

## 2021-07-29 DIAGNOSIS — J449 Chronic obstructive pulmonary disease, unspecified: Secondary | ICD-10-CM

## 2021-07-31 NOTE — Chronic Care Management (AMB) (Signed)
Chronic Care Management Pharmacy Note  07/31/2021 Name:  William Whitaker MRN:  [id number] DOB:  12-18-1960  Summary: Continues to have difficulty affording COPD therapy which has led to low adherence with Spiriva. He does use ipratropium in nebulizer and albuterol inhaler as needed.  Does not qualify for [facility] Care program to receive PAP for Spiriva due income was too high in 2022. Still does not qualify in 2023. Incruse is an option but their program requires patient to spend $600 out of pocket first. He might qualify for [place] program and could switch to anticholinergic / LABA combo like Bevespi.  Consulting with PCP Checked resources ([place], [employer], Patient [employer]) for assistance with copay and deductible but no current funds open for COPD. Will continue to assess for possible assistance with copay / deductible and monitor for funding thru above mentioned resources.  Reviewed [employer]'s 2023 pharmacy formulary for other medications. Patient states other medications are affordable  Subjective: William Whitaker is an 61 y.o. year old male who is a primary patient of Paz, Alda Berthold, MD.  The CCM team was consulted for assistance with disease management and care coordination needs.    Engaged with patient by telephone for follow up visit in response to provider referral for pharmacy case management and/or care coordination services.   Consent to Services:  The patient was given information about Chronic Care Management services, agreed to services, and gave verbal consent prior to initiation of services.  Please see initial visit note for detailed documentation.   Patient Care Team: Colon Branch, MD as PCP - General (Internal Medicine) Cherre Robins, RPH-CPP (Pharmacist)  Recent office visits: 01/31/2021 - PCP (Dr Larose Kells) F/U perforated appendix / hernia repair. Refill hydrocodone / acetaminophen for pain. Referred to pulmonology due to new finding of nodule on lung CT.    09/29/20-Jose Larose Kells, MD(PCP) F/U chronic conditions. Aspirin removed for med list (patient not taking); Checked labs - CMP, A1c, CBC, ferritin, TSH.   Recent consult visits: 03/01/2021 [facility] s/p appendectomy - wound check. Daily fiber supplementation, adequate hydration, and frequent ambulation were recommended.  02/15/2021 - [facility] ([facility]) post-op appendectomy. Wound progressing well and has some hypergranulation that we treated with silver nitrate. Will change to mupirocin and dry dressing daily.  02/01/2021 - [facility]) post-op appendectomy wound check. Continue WTD dressing changes daily. Discussed signs and symptoms of infection and patient knows to contact our clinic should he experience any of these.   Hospital visits: 01/12/2021- to 01/23/2021 - Admitted to [facility] for perforated appendicitis and primary repair of umbilical hernia. Also had post op ileus.  New medications at discharge: Hydrocodone / acetaminophen 5/320m every 6 hours as needed for up to 5 days.    Objective:  Lab Results  Component Value Date   CREATININE 0.71 01/31/2021   CREATININE 0.78 09/29/2020   CREATININE 0.77 03/05/2020    Lab Results  Component Value Date   HGBA1C 5.3 09/29/2020   Last diabetic Eye exam: No results found for: HMDIABEYEEXA  Last diabetic Foot exam: No results found for: HMDIABFOOTEX   No results found for: CHOL, TRIG, HDL, CHOLHDL, VLDL, LDLCALC, LDLDIRECT  Hepatic Function Latest Ref Rng & Units 01/31/2021 09/29/2020 03/05/2020  Total Protein 6.0 - 8.3 g/dL 7.7 8.0 7.8  Albumin 3.5 - 5.2 g/dL 4.0 4.4 4.1  AST 0 - 37 U/L 29 23  36  ALT 0 - 53 U/L 21 13 15   Alk Phosphatase 39 - 117 U/L 99 82 62  Total Bilirubin 0.2 - 1.2 mg/dL 0.6 0.7 0.8    Lab Results  Component Value Date/Time   TSH 1.46 09/29/2020 02:59 PM   TSH 3.002 01/25/2019 05:44 AM    CBC Latest Ref Rng  & Units 01/31/2021 09/29/2020 03/05/2020  WBC 4.0 - 10.5 K/uL 5.7 6.6 4.7  Hemoglobin 13.0 - 17.0 g/dL 12.2(L) 14.4 9.0(L)  Hematocrit 39.0 - 52.0 % 36.9(L) 42.8 32.4(L)  Platelets 150.0 - 400.0 K/uL 223.0 143.0(L) 202    No results found for: VD25OH  Clinical ASCVD: No  The ASCVD Risk score (Arnett DK, et al., 2019) failed to calculate for the following reasons:   Cannot find a previous HDL lab   Cannot find a previous total cholesterol lab     Social History   Tobacco Use  Smoking Status Every Day   Packs/day: 0.50   Years: 25.00   Pack years: 12.50   Types: Cigarettes  Smokeless Tobacco Never  Tobacco Comments   3 PPD, started to decrease to 1/2 ppd 2019, 2021 now smoking 1/4 ppd   BP Readings from Last 3 Encounters:  01/31/21 124/80  09/29/20 (!) 144/70  06/01/20 138/74   Pulse Readings from Last 3 Encounters:  01/31/21 93  09/29/20 78  06/01/20 80   Wt Readings from Last 3 Encounters:  03/02/21 194 lb (88 kg)  01/31/21 194 lb 2 oz (88.1 kg)  09/29/20 203 lb 6 oz (92.3 kg)    Assessment: Review of patient past medical history, allergies, medications, health status, including review of consultants reports, laboratory and other test data, was performed as part of comprehensive evaluation and provision of chronic care management services.   SDOH:  (Social Determinants of Health) assessments and interventions performed:  SDOH Interventions    Flowsheet Row Most Recent Value  SDOH Interventions   Financial Strain Interventions Other (Comment)  [patient was denied BI Cares assistance in 2022 because he has insurance coverage just cannot afford deductible and copay. Checked resources for assistance with copay and deductible and no current funds open for COPD.]       CCM Care Plan  No Known Allergies  Medications Reviewed Today     Reviewed by Cherre Robins, RPH-CPP (Pharmacist) on 07/31/21 at [facility] List Status: <None>   Medication Order Taking? Sig  Documenting Provider Last Dose Status Informant  albuterol (VENTOLIN HFA) 108 (90 Base) MCG/ACT inhaler [id number] Yes USE 2 INHALATIONS BY MOUTH  EVERY 6 HOURS AS NEEDED Colon Branch, MD Taking Active   benzonatate (TESSALON) 100 MG capsule [id number] Yes Take 1 capsule (100 mg total) by mouth 3 (three) times daily as needed for cough. Colon Branch, MD Taking Active   Cholecalciferol (VITAMIN D3) 50 MCG (2000 UT) TABS [id number] Yes Take 1 tablet by mouth daily. [provider] Taking Active   ferrous sulfate 325 (65 FE) MG tablet [id number] Yes Take 1 tablet (325 mg total) by mouth daily with breakfast. Colon Branch, MD Taking Active   furosemide (LASIX) 20 MG tablet [id number] Yes Take 1 tablet (20 mg total) by mouth daily. Colon Branch, MD Taking Active   HYDROcodone-acetaminophen (NORCO/VICODIN) 5-325 MG tablet [id number] Yes Take 1 tablet by mouth 3 (three) times daily as needed. Colon Branch, MD Taking Active   hydrOXYzine (ATARAX/VISTARIL) 10 MG tablet [id number] Yes Take 1 tablet (10 mg total) by mouth  3 (three) times daily as needed for anxiety. Colon Branch, MD Taking Active   ipratropium (ATROVENT HFA) 17 MCG/ACT inhaler [id number] No Inhale 2 puffs into the lungs every 4 (four) hours as needed for wheezing.  Patient not taking: Reported on 07/31/2021   Colon Branch, MD Not Taking Active   ipratropium (ATROVENT) 0.02 % nebulizer solution [id number] Yes Use 1 vial via nebulizer 4 times daily Colon Branch, MD Taking Active   Magnesium 250 MG TABS [id number] Yes Take 1 tablet by mouth daily. [provider] Taking Active   Multiple Vitamins-Minerals (ONE-A-DAY MENS VITACRAVES) CHEW [id number] Yes Chew 1 tablet by mouth daily. [provider] Taking Active Self  omeprazole (PRILOSEC) 20 MG capsule [id number] Yes TAKE 1 CAPSULE BY MOUTH  DAILY Colon Branch, MD Taking Active   potassium chloride SA (KLOR-CON M) 20 MEQ tablet [id number] No TAKE 1 TABLET BY MOUTH  DAILY  Patient not taking:  Reported on 07/31/2021   Colon Branch, MD Not Taking Active   propranolol (INDERAL) 20 MG tablet [id number] Yes TAKE 1 TABLET BY MOUTH TWICE A DAY Colon Branch, MD Taking Active   spironolactone (ALDACTONE) 50 MG tablet [id number] Yes TAKE 1 TABLET BY MOUTH EVERY DAY Colon Branch, MD Taking Active   Tiotropium Bromide Monohydrate (SPIRIVA RESPIMAT) 2.5 MCG/ACT AERS [id number] No Inhale 2.5 mcg into the lungs daily. 2 puffs daily  Patient not taking: Reported on 07/31/2021   [provider] Not Taking Active   topiramate (TOPAMAX) 25 MG tablet [id number] Yes Take 1 tablet (25 mg total) by mouth daily. Colon Branch, MD Taking Active   vitamin C (ASCORBIC ACID) 250 MG tablet [id number] Yes Take 250 mg by mouth daily. [provider] Taking Active Self  Vitamin Mixture (VITAMIN E COMPLETE PO) [id number] Yes Take by mouth. [provider] Taking Active             Patient Active Problem List   Diagnosis Date Noted   Insomnia 09/29/2020   COPD (chronic obstructive pulmonary disease) ([place]) 04/09/2019   PCP NOTES >>>>>>>>>>>>>>>>>> 02/05/2019   Chronic hepatitis ([place]) 02/05/2019   EtOH dependence ([place]) 01/25/2019   Tobacco dependence 36/14/4315   Alcoholic cirrhosis of liver with ascites ([place]) 01/25/2019   Hyponatremia 01/25/2019   Iron deficiency anemia    Symptomatic anemia 01/24/2019   Anxiety 08/20/2007   REACTIVE AIRWAY DISEASE 08/20/2007   G E R D 08/20/2007    Immunization History  Administered Date(s) Administered   Hep A / Hep B 06/30/2019   Hepatitis A, Adult 04/21/2019   Hepatitis B, ped/adol 03/11/2019   Influenza Inj Mdck Quad Pf 04/10/2019   Janssen (J&J) SARS-COV-2 Vaccination 02/04/2020    Conditions to be addressed/monitored: COPD, Anxiety, and tobacco use disorder and cirrhosis  Care Plan : General Pharmacy (Adult)  Updates made by Cherre Robins, RPH-CPP since 07/31/2021 12:00 AM     Problem: COPD, Anxiety, Cirrhosis   Priority: High  Onset  Date: 08/19/2020     Long-Range Goal: Patient-Specific Goal   Start Date: 08/19/2020  Expected End Date: 02/16/2021  Recent Progress: On track  Priority: High  Note:   Current Barriers:  Unable to independently afford treatment regimen Not taking Spiriva (maintenance inhaler) or using Atrovent inhaler due to cost  Pharmacist Clinical Goal(s):  Over the next 90 days, patient will verbalize ability to afford treatment regimen achieve adherence to monitoring guidelines and medication adherence to achieve therapeutic efficacy maintain control of COPD  as evidenced by exacerbation frequency  through collaboration with PharmD and provider.   Interventions: 1:1 collaboration with Colon Branch, MD regarding development and update of comprehensive plan of care as evidenced by provider attestation and co-signature Inter-disciplinary care team collaboration (see longitudinal plan of care) Comprehensive medication review performed; medication list updated in electronic medical record  COPD/Tobacco (Goal: control symptoms and prevent exacerbations) Controlled / no recent COPD exacerbations in last year that required treatment Current COPD Classification:  B (high sx, <2 exacerbations/yr) Current therapy:  Alubterol HFA - inhale 2 puffs up to every 6 hours as needed Atrovent (ipratropium) HFA - inhaler 2 puffs every 4 hours as needed  (not taking due to cost of medication) Atorvent (ipratropium) nebules - use 1 ampule in nebulizer 4 times a day as needed Spiriva respimat inhaler - 2 puffs daily (not taking due to cost of medication) Cost of medications for COPD has been barrier to adherence. Changed to [facility] plan for 2023 but still has $95 deductible and then $45 / month cost for either Atrovent HFA or Spiriva. Patient was denied BI Cares assistance in 2022 because he has insurance coverage just cannot afford deductible and copay. Checked resources ([place], [employer], Patient  [employer]) for assistance with copay and deductible but no current funds open for COPD.  Patient reports he uses albuterol resuce inhaler about every other day  Interventions:  Recommended patient use ipratropium nebules twice a day  - cost through [place] is $0 for 2023 Continue to use albuterol HFA as needed.  Tier 1 medication - $5 per 30 days or $15 per 90 days Will continue to assess for possible assistance with copay / deductible and monitor for funding thru above mentioned resources.   Tobacco Use: Continues to smoke 1/2 PPD Paitent reports he is not ready to quit smoking at this time.  First cigarette occurs in the afternoon.  Has tried patches before and tapering number of cigarettes per day - not successful Current treatment  none Intervention:  Will continue to assess patients readiness for smoking cessation  Depression/Anxiety (Goal: Minimize symptoms) Improving Current treatment: Hydroxyzine 7m 3 times a day as needed Medications previously tried/failed: none noted Reports he takes when needed for sleep or if he needs to during the day. Interventions:  Recommended to continue current medication  Cirrhosis/Alcohol use disorder (Goal: Maintain) Controlled Managed by GI - Dr CBryan LemmaCurrent treatment  Furosemide 251mdaily Potassium 2073mdaily Omeprazole 65m45mily Propranolol 65mg89mce daily Spironolactone 50mg 27my Topiramate 25mg d65m Medications previously tried: none noted  Intervention:  Recommended to continue current medication  Anemia: Current therapy:  Ferrous sulfate 325mg da38mwith breakfast Interventions:  Continue current therapy  Health Maintenance:  Interventions: Reviewed vaccination records Discussed needed vaccines Patient self care activities - Over the next 30 days, patient will: Get Shingrix and Tetanus vaccine - discussed that these should be covered this year (2023) at no cost.  Get updated COVID vaccine  Medication  management Pharmacist Clinical Goal(s): Over the next 90 days, patient will work with PharmD and providers to maintain optimal medication adherence [facility] Interventions Comprehensive medication review performed. Reviewed refill history and assessed adherence Continue current medication management strategy Reviewed 2023 formulary for [employer]:  Tier 1 medications = $0: furosemide, spironolactone, ipratropium solution for nebulizer and omeprazole Tier 2 medications = $5 / $15 copay: albuterol, potassium chloride; propranolol Tier 3 medications = $15 / $45 copay; hydrocodone/acetaminophen Tier 4 medication = $45 / $  135 copay: hydroxyzine; Atrovent HFA; Spiriva Respimat Patient self care activities - Over the next 90 days, patient will: Focus on medication adherence by filling medications appropriately  Take medications as prescribed Report any questions or concerns to PharmD and/or provider(s)   Patient Goals/Self-Care Activities Over the next 90 days, patient will:  take medications as prescribed focus on medication adherence by pill count collaborate with provider on medication access solutions  Follow Up Plan: Clinical pharmacist follow up in 3 to 4 months.        Medication Assistance: Patient has insurance coverage for Incruse / Atrovent HFA but has $95 deductible + $45 copay. He might be able to use anticholinergic / LABA combo like Bevespi and could qualify for [place] and [place] patient assistance program. Checking with PCP about change to [facility] and trying for patient assistance program.   Patient's preferred pharmacy is:  [facility], [place] - [address] [place] [address] Phone: [phone] Fax: [phone]  [id number] [place],  - [address] [address]Phone: [phone]:  [phone] [facility] ([place], [place] [facility]Phone: [phone]: [phone] [facility] ([facility] ) - [place] [address] [address]Phone: [phone]: [phone]   Follow Up:  Patient agrees to Care Plan and Follow-up.  Plan:  pharmacist follow up in 3 to 4 months.   TCherre Robins PharmD Clinical Pharmacist [phone]

## 2021-07-31 NOTE — Patient Instructions (Signed)
William Whitaker It was a pleasure speaking with you today.  I have attached a summary of our visit today and information about your health goals.   If you have any questions or concerns, please feel free to contact me either at the phone number below or with a MyChart message.   Keep up the good work!  Cherre Robins, PharmD Clinical Pharmacist [facility] [phone] (direct line)  [phone] (main office number)  CARE PLAN ENTRY   COPD/Tobacco Use Disorder Pharmacist Clinical Goal(s) Over the next 90 days, patient will work with PharmD and providers to create an affordable treatment plan for his inhaler regimen Over the next 90 days, patient will decrease/eliminate his use of cigarettes Current regimen:  Spiriva Respimat 2.8mcg 2 puffs daily (not taking due to cost)  Albuterol inhaler - 2 puffs every 6 hours as needed Ipratropium 0.02% nebulized solution or inhaler up to 4 times a day as needed Interventions:  Checked into possible assistance for inhalers for 2023 - no funds currently available for COPD Patient self care activities - Over the next 90 days, patient will: Continue to use ipratropium with nebulizer and albuterol inhaler as needed  Decrease cigarette smoking by at least 2 cigarettes per day  Anxiety Pharmacist Clinical Goal(s) Over the next 90 days, patient will work with PharmD and providers to reduce symptoms/limitations of anxiety Current regimen:  Hydroxyzine 10mg  3 times a day as needed for anxiety or sleep.  Patient self care activities - Over the next 90 days, patient will: Continue hydroxyzine for anxiety  Health Maintenance:  Pharmacist Clinical Goal(s) Over the next 90 days, patient will work with PharmD and providers to maintain health Interventions: Reviewed vaccination records Discussed needed vaccines Patient self care activities - Over the next 30 days, patient will: Get Shingrix and Tetanus vaccine (should be  covered by Medicare for 2023)  Get updated COVID vaccine  Medication management Pharmacist Clinical Goal(s): Over the next 90 days, patient will work with PharmD and providers to maintain optimal medication adherence [facility] Interventions Comprehensive medication review performed. Continue current medication management strategy Patient self care activities - Over the next 90 days, patient will: Focus on medication adherence by filling medications appropriately Take medications as prescribed Report any questions or concerns to PharmD and/or provider(s) Reviewed 2023 formulary for [employer]:  Tier 1 medications = $0: furosemide, spironolactone, ipratropium solution for nebulizer and omeprazole Tier 2 medications = $5 / $15 copay: albuterol, potassium chloride; propranolol Tier 3 medications = $15 / $45 copay; hydrocodone/acetaminophen Tier 4 medication = $45 / $135 copay: hydroxyzine; Atrovent HFA; Spiriva Respima   Patient Goals/Self-Care Activities Over the next 90 days, patient will:  take medications as prescribed focus on medication adherence by pill count collaborate with provider on medication access solutions  Follow Up Plan: Clinical pharmacist follow up in 3 to 4 months.  The patient verbalized understanding of instructions, educational materials, and care plan provided today and agreed to receive a mailed copy of patient instructions, educational materials, and care plan.

## 2021-08-02 DIAGNOSIS — D649 Anemia, unspecified: Secondary | ICD-10-CM | POA: Diagnosis not present

## 2021-08-02 DIAGNOSIS — F418 Other specified anxiety disorders: Secondary | ICD-10-CM | POA: Diagnosis not present

## 2021-08-02 DIAGNOSIS — F17210 Nicotine dependence, cigarettes, uncomplicated: Secondary | ICD-10-CM

## 2021-08-02 DIAGNOSIS — J449 Chronic obstructive pulmonary disease, unspecified: Secondary | ICD-10-CM

## 2021-08-02 DIAGNOSIS — R69 Illness, unspecified: Secondary | ICD-10-CM | POA: Diagnosis not present

## 2021-08-04 ENCOUNTER — Ambulatory Visit (INDEPENDENT_AMBULATORY_CARE_PROVIDER_SITE_OTHER): Payer: Medicare HMO | Admitting: Pharmacist

## 2021-08-04 DIAGNOSIS — Z79899 Other long term (current) drug therapy: Secondary | ICD-10-CM

## 2021-08-04 DIAGNOSIS — J449 Chronic obstructive pulmonary disease, unspecified: Secondary | ICD-10-CM

## 2021-08-04 NOTE — Chronic Care Management (AMB) (Signed)
Chronic Care Management Pharmacy Note  08/04/2021 Name:  William Whitaker MRN:  [id number] DOB:  [birth year]  Summary: Continues to have difficulty affording COPD therapy which has led to low adherence with Spiriva. He does use ipratropium in nebulizer and albuterol inhaler as needed.  Does not qualify for [facility] Care program to receive PAP for Spiriva due income was too high in 2022. Still does not qualify in 2023. Incruse is an option but their program requires patient to spend $600 out of pocket first. [place] might qualify for [place] program and could switch to anticholinergic / LABA combo like Bevespi.  Consulted with PCP and agreed to trial of Bevespi. Started application process for patient assistance program.  Made appointment for follow up with PCP for 08/12/2021  Subjective: William Whitaker is an 61 y.o. year old male who is a primary patient of Paz, Alda Berthold, MD.  The CCM team was consulted for assistance with disease management and care coordination needs.    Engaged with patient by telephone for follow up visit in response to provider referral for pharmacy case management and/or care coordination services.   Consent to Services:  The patient was given information about Chronic Care Management services, agreed to services, and gave verbal consent prior to initiation of services.  Please see initial visit note for detailed documentation.   Patient Care Team: Colon Branch, MD as PCP - General (Internal Medicine) Cherre Robins, RPH-CPP (Pharmacist)  Recent office visits: 01/31/2021 - PCP (Dr Larose Kells) F/U perforated appendix / hernia repair. Refill hydrocodone / acetaminophen for pain. Referred to pulmonology due to new finding of nodule on lung CT.   09/29/20-Jose Larose Kells, MD(PCP) F/U chronic conditions. Aspirin removed for med list (patient not taking); Checked labs - CMP, A1c, CBC, ferritin, TSH.   Recent consult visits: 03/01/2021 - [facility] ([facility]) s/p appendectomy - wound  check. Daily fiber supplementation, adequate hydration, and frequent ambulation were recommended.  02/15/2021 - [facility] ([facility]) post-op appendectomy. Wound progressing well and has some hypergranulation that we treated with silver nitrate. Will change to mupirocin and dry dressing daily.  02/01/2021 - [facility] ([facility]) post-op appendectomy wound check. Continue WTD dressing changes daily. Discussed signs and symptoms of infection and patient knows to contact our clinic should he experience any of these.   Hospital visits: 01/12/2021- to 01/23/2021 - Admitted to [facility] ([facility]) for perforated appendicitis and primary repair of umbilical hernia. Also had post op ileus.  New medications at discharge: Hydrocodone / acetaminophen 5/362m every 6 hours as needed for up to 5 days.    Objective:  Lab Results  Component Value Date   CREATININE 0.71 01/31/2021   CREATININE 0.78 09/29/2020   CREATININE 0.77 03/05/2020    Lab Results  Component Value Date   HGBA1C 5.3 09/29/2020   Last diabetic Eye exam: No results found for: HMDIABEYEEXA  Last diabetic Foot exam: No results found for: HMDIABFOOTEX   No results found for: CHOL, TRIG, HDL, CHOLHDL, VLDL, LDLCALC, LDLDIRECT  Hepatic Function Latest Ref Rng & Units 01/31/2021 09/29/2020 03/05/2020  Total Protein 6.0 - 8.3 g/dL 7.7 8.0 7.8  Albumin 3.5 - 5.2 g/dL 4.0 4.4 4.1  AST 0 - 37 U/L 29 23 36  ALT 0 - 53 U/L _0 Alk Phosphatase 39 - 117 U/L 99 82 62  Total Bilirubin 0.2 - 1.2 mg/dL 0.6 0.7 0.8  Lab Results  Component Value Date/Time   TSH 1.46 09/29/2020 02:59 PM   TSH 3.002 01/25/2019 05:44 AM    CBC Latest Ref Rng & Units 01/31/2021 09/29/2020 03/05/2020  WBC 4.0 - 10.5 K/uL 5.7 6.6 4.7  Hemoglobin 13.0 - 17.0 g/dL 12.2(L) 14.4 9.0(L)  Hematocrit 39.0 - 52.0 % 36.9(L) 42.8 32.4(L)  Platelets 150.0 - 400.0 K/uL 223.0 143.0(L) 202    No results found for:  VD25OH  Clinical ASCVD: No  The ASCVD Risk score (Arnett DK, et al., 2019) failed to calculate for the following reasons:   Cannot find a previous HDL lab   Cannot find a previous total cholesterol lab     Social History   Tobacco Use  Smoking Status Every Day   Packs/day: 0.50   Years: 25.00   Pack years: 12.50   Types: Cigarettes  Smokeless Tobacco Never  Tobacco Comments   3 PPD, started to decrease to 1/2 ppd 2019, 2021 now smoking 1/4 ppd   BP Readings from Last 3 Encounters:  01/31/21 124/80  09/29/20 (!) 144/70  06/01/20 138/74   Pulse Readings from Last 3 Encounters:  01/31/21 93  09/29/20 78  06/01/20 80   Wt Readings from Last 3 Encounters:  03/02/21 194 lb (88 kg)  01/31/21 194 lb 2 oz (88.1 kg)  09/29/20 203 lb 6 oz (92.3 kg)    Assessment: Review of patient past medical history, allergies, medications, health status, including review of consultants reports, laboratory and other test data, was performed as part of comprehensive evaluation and provision of chronic care management services.   SDOH:  (Social Determinants of Health) assessments and interventions performed:     CCM Care Plan  No Known Allergies  Medications Reviewed Today     Reviewed by Cherre Robins, RPH-CPP (Pharmacist) on 08/04/21 at 1403  Med List Status: <None>   Medication Order Taking? Sig Documenting Provider Last Dose Status Informant  albuterol (VENTOLIN HFA) 108 (90 Base) MCG/ACT inhaler [id number] Yes USE 2 INHALATIONS BY MOUTH  EVERY 6 HOURS AS NEEDED Colon Branch, MD Taking Active   Cholecalciferol (VITAMIN D3) 50 MCG (2000 UT) TABS [id number] Yes Take 1 tablet by mouth daily. [provider] Taking Active   ferrous sulfate 325 (65 FE) MG tablet [id number] Yes Take 1 tablet (325 mg total) by mouth daily with breakfast. Colon Branch, MD Taking Active   furosemide (LASIX) 20 MG tablet [id number] Yes Take 1 tablet (20 mg total) by mouth daily. Colon Branch, MD Taking Active    HYDROcodone-acetaminophen (NORCO/VICODIN) 5-325 MG tablet [id number] Yes Take 1 tablet by mouth 3 (three) times daily as needed. Colon Branch, MD Taking Active   hydrOXYzine (ATARAX/VISTARIL) 10 MG tablet [id number] Yes Take 1 tablet (10 mg total) by mouth 3 (three) times daily as needed for anxiety. Colon Branch, MD Taking Active   ipratropium (ATROVENT) 0.02 % nebulizer solution [id number] Yes Use 1 vial via nebulizer 4 times daily Colon Branch, MD Taking Active   Magnesium 250 MG TABS [id number] Yes Take 1 tablet by mouth daily. [provider] Taking Active   Multiple Vitamins-Minerals (ONE-A-DAY MENS VITACRAVES) CHEW [id number] Yes Chew 1 tablet by mouth daily. [provider] Taking Active Self  omeprazole (PRILOSEC) 20 MG capsule [id number] Yes TAKE 1 CAPSULE BY MOUTH  DAILY Colon Branch, MD Taking Active   potassium chloride SA (KLOR-CON M) 20 MEQ tablet [id number] Yes TAKE 1 TABLET BY MOUTH  DAILY Paz, Chillum,  MD Taking Active   propranolol (INDERAL) 20 MG tablet [id number] Yes TAKE 1 TABLET BY MOUTH TWICE A DAY Colon Branch, MD Taking Active   spironolactone (ALDACTONE) 50 MG tablet [id number] Yes TAKE 1 TABLET BY MOUTH EVERY DAY Colon Branch, MD Taking Active   topiramate (TOPAMAX) 25 MG tablet [id number] Yes Take 1 tablet (25 mg total) by mouth daily. Colon Branch, MD Taking Active   vitamin C (ASCORBIC ACID) 250 MG tablet [id number] Yes Take 250 mg by mouth daily. [provider] Taking Active Self  Vitamin Mixture (VITAMIN E COMPLETE PO) [id number] Yes Take by mouth. [provider] Taking Active             Patient Active Problem List   Diagnosis Date Noted   Insomnia 09/29/2020   COPD (chronic obstructive pulmonary disease) ([place]) 04/09/2019   PCP NOTES >>>>>>>>>>>>>>>>>> 02/05/2019   Chronic hepatitis ([place]) 02/05/2019   EtOH dependence ([place]) 01/25/2019   Tobacco dependence 99/35/7017   Alcoholic cirrhosis of liver with ascites ([place]) 01/25/2019    Hyponatremia 01/25/2019   Iron deficiency anemia    Symptomatic anemia 01/24/2019   Anxiety 08/20/2007   REACTIVE AIRWAY DISEASE 08/20/2007   G E R D 08/20/2007    Immunization History  Administered Date(s) Administered   Hep A / Hep B 06/30/2019   Hepatitis A, Adult 04/21/2019   Hepatitis B, ped/adol 03/11/2019   Influenza Inj Mdck Quad Pf 04/10/2019   Janssen (J&J) SARS-COV-2 Vaccination 02/04/2020    Conditions to be addressed/monitored: COPD, Anxiety, and tobacco use disorder and cirrhosis  Care Plan : General Pharmacy (Adult)  Updates made by Cherre Robins, RPH-CPP since 08/04/2021 12:00 AM     Problem: COPD, Anxiety, Cirrhosis   Priority: High  Onset Date: 08/19/2020     Long-Range Goal: Patient-Specific Goal   Start Date: 08/19/2020  Expected End Date: 02/16/2021  Recent Progress: On track  Priority: High  Note:   Current Barriers:  Unable to independently afford treatment regimen Not taking Spiriva (maintenance inhaler) or using Atrovent inhaler due to cost  Pharmacist Clinical Goal(s):  Over the next 90 days, patient will verbalize ability to afford treatment regimen achieve adherence to monitoring guidelines and medication adherence to achieve therapeutic efficacy maintain control of COPD as evidenced by exacerbation frequency  through collaboration with PharmD and provider.   Interventions: 1:1 collaboration with Colon Branch, MD regarding development and update of comprehensive plan of care as evidenced by provider attestation and co-signature Inter-disciplinary care team collaboration (see longitudinal plan of care) Comprehensive medication review performed; medication list updated in electronic medical record  COPD/Tobacco (Goal: control symptoms and prevent exacerbations) Controlled / no recent COPD exacerbations in last year that required treatment Current COPD Classification:  B (high sx, <2 exacerbations/yr) Current therapy:  Alubterol HFA - inhale 2  puffs up to every 6 hours as needed Atrovent (ipratropium) HFA - inhaler 2 puffs every 4 hours as needed  (not taking due to cost of medication) Atorvent (ipratropium) nebules - use 1 ampule in nebulizer 4 times a day as needed Spiriva respimat inhaler - 2 puffs daily (not taking due to cost of medication) Cost of medications for COPD has been barrier to adherence. Changed to [facility] plan for 2023 [phone]) but Spiriva non formulary. Atrovent HFA and Incruse are on formulary but still has $95 deductible and then $45 / month cost for either. Patient was denied [employer] assistance in 2022 because he income was above cut off.  Checked resources ([place], [employer], Patient [employer]) for assistance with copay and deductible but no current funds open for COPD.  Patient reports he uses albuterol resuce inhaler about every other day  Interventions:  Recommended patient use ipratropium nebules twice a day  - cost through [place] is $0 for 2023 Continue to use albuterol HFA as needed.  Tier 1 medication - $5 per 30 days or $15 per 90 days Dr Larose Kells agreed to trial of Charolotte Eke if patient approved for patient assistance program. Started patient assistance program application process today. Patient to sign application when in office next week.    Tobacco Use: Continues to smoke 1/2 PPD Paitent reports he is not ready to quit smoking at this time.  First cigarette occurs in the afternoon.  Has tried patches before and tapering number of cigarettes per day - not successful Current treatment  none Intervention:  Will continue to assess patients readiness for smoking cessation  Depression/Anxiety (Goal: Minimize symptoms) Improving Current treatment: Hydroxyzine 67m 3 times a day as needed Medications previously tried/failed: none noted Reports he takes when needed for sleep or if he needs to during the day. Interventions:  Recommended to continue current  medication  Cirrhosis/Alcohol use disorder (Goal: Maintain) Controlled Managed by GI - Dr CBryan LemmaCurrent treatment  Furosemide 263mdaily Potassium 2033mdaily Omeprazole 20m21mily Propranolol 20mg58mce daily Spironolactone 50mg 36my Topiramate 25mg d46m Medications previously tried: none noted  Intervention:  Recommended to continue current medication  Anemia: Current therapy:  Ferrous sulfate 325mg da24mwith breakfast Interventions:  Continue current therapy  Health Maintenance:  Interventions: Reviewed vaccination records Discussed needed vaccines Patient self care activities - Over the next 30 days, patient will: Get Shingrix and Tetanus vaccine - discussed that these should be covered this year (2023) at no cost.  Get updated COVID vaccine  Medication management Pharmacist Clinical Goal(s): Over the next 90 days, patient will work with PharmD and providers to maintain optimal medication adherence [facility] Interventions Comprehensive medication review performed. Reviewed refill history and assessed adherence Continue current medication management strategy Reviewed 2023 formulary for [employer]:  Tier 1 medications = $0: furosemide, spironolactone, ipratropium solution for nebulizer and omeprazole Tier 2 medications = $5 / $15 copay: albuterol, potassium chloride; propranolol Tier 3 medications = $15 / $45 copay; hydrocodone/acetaminophen Tier 4 medication = $45 / $135 copay: hydroxyzine; Atrovent HFA; Spiriva Respimat Patient self care activities - Over the next 90 days, patient will: Focus on medication adherence by filling medications appropriately  Take medications as prescribed Report any questions or concerns to PharmD and/or provider(s)   Patient Goals/Self-Care Activities Over the next 90 days, patient will:  take medications as prescribed focus on medication adherence by pill count collaborate with provider on medication  access solutions  Follow Up Plan: Clinical pharmacist follow up in 3 to 4 months.        Medication Assistance: Patient has insurance coverage for Incruse / Atrovent HFA but has $95 deductible + $45 copay. He might be able to use anticholinergic / LABA combo like Bevespi and could qualify for [place] and [place] patient assistance program. Checking with PCP about change to Bevespi [facility] for patient assistance program.   [facility]  [id number] [place] [phone] #[id number] - [id number], [place] - [address] AT [address]CROSS FROM  [address] [place] [address] Phone: [phone] Fax: [phone]  [facility] ([place] - [place], [place] [facility] [address] [place] [address] [place] [address] Phone: [phone] Fax: [phone]  [facility] ([facility] ) - [place], [place] [address] Phone: [phone] Fax: [phone]    Follow Up:  Patient agrees to Care Plan and Follow-up.  Plan:  pharmacist follow up in 3 to 4 months.   Cherre Robins, PharmD Clinical Pharmacist [facility] [phone]

## 2021-08-04 NOTE — Patient Instructions (Signed)
[name], It was a pleasure speaking with you today.  I have attached a summary of our visit today and information about your health goals.   If you have any questions or concerns, please feel free to contact me either at the phone number below or with a MyChart message.   Keep up the good work!  Cherre Robins, PharmD Clinical Pharmacist [facility] [address] [facility] [phone] (direct line)  [phone] (main office number)   No recent changes in care plan - patient received copy 1 week ago.     CARE PLAN ENTRY   COPD/Tobacco Use Disorder Pharmacist Clinical Goal(s) Over the next 90 days, patient will work with PharmD and providers to create an affordable treatment plan for his inhaler regimen Over the next 90 days, patient will decrease/eliminate his use of cigarettes Current regimen:  Spiriva Respimat 2.97mcg 2 puffs daily (not taking due to cost)  Albuterol inhaler - 2 puffs every 6 hours as needed Ipratropium 0.02% nebulized solution or inhaler up to 4 times a day as needed Interventions:  Checked into possible assistance for inhalers for 2023 - no funds currently available for COPD Patient self care activities - Over the next 90 days, patient will: Continue to use ipratropium with nebulizer and albuterol inhaler as needed  Decrease cigarette smoking by at least 2 cigarettes per day  Anxiety Pharmacist Clinical Goal(s) Over the next 90 days, patient will work with PharmD and providers to reduce symptoms/limitations of anxiety Current regimen:  Hydroxyzine 10mg  3 times a day as needed for anxiety or sleep.  Patient self care activities - Over the next 90 days, patient will: Continue hydroxyzine for anxiety  Health Maintenance:  Pharmacist Clinical Goal(s) Over the next 90 days, patient will work with PharmD and providers to maintain health Interventions: Reviewed vaccination records Discussed needed vaccines Patient self care activities - Over the  next 30 days, patient will: Get Shingrix and Tetanus vaccine (should be covered by Medicare for 2023)  Get updated COVID vaccine  Medication management Pharmacist Clinical Goal(s): Over the next 90 days, patient will work with PharmD and providers to maintain optimal medication adherence [facility] Interventions Comprehensive medication review performed. Continue current medication management strategy Patient self care activities - Over the next 90 days, patient will: Focus on medication adherence by filling medications appropriately Take medications as prescribed Report any questions or concerns to PharmD and/or provider(s) Reviewed 2023 formulary for [employer]:  Tier 1 medications = $0: furosemide, spironolactone, ipratropium solution for nebulizer and omeprazole Tier 2 medications = $5 / $15 copay: albuterol, potassium chloride; propranolol Tier 3 medications = $15 / $45 copay; hydrocodone/acetaminophen Tier 4 medication = $45 / $135 copay: hydroxyzine; Atrovent HFA; Spiriva Respima   Patient Goals/Self-Care Activities Over the next 90 days, patient will:  take medications as prescribed focus on medication adherence by pill count collaborate with provider on medication access solutions  Follow Up Plan: Clinical pharmacist follow up in 3 to 4 months.

## 2021-08-12 ENCOUNTER — Ambulatory Visit (INDEPENDENT_AMBULATORY_CARE_PROVIDER_SITE_OTHER): Payer: Medicare HMO | Admitting: Internal Medicine

## 2021-08-12 ENCOUNTER — Other Ambulatory Visit: Payer: Self-pay | Admitting: Internal Medicine

## 2021-08-12 ENCOUNTER — Encounter: Payer: Self-pay | Admitting: Internal Medicine

## 2021-08-12 VITALS — BP 140/80 | HR 78 | Temp 98.4°F | Resp 16 | Ht 73.0 in | Wt 207.0 lb

## 2021-08-12 DIAGNOSIS — K7460 Unspecified cirrhosis of liver: Secondary | ICD-10-CM | POA: Diagnosis not present

## 2021-08-12 DIAGNOSIS — D649 Anemia, unspecified: Secondary | ICD-10-CM

## 2021-08-12 DIAGNOSIS — Z125 Encounter for screening for malignant neoplasm of prostate: Secondary | ICD-10-CM

## 2021-08-12 DIAGNOSIS — R911 Solitary pulmonary nodule: Secondary | ICD-10-CM

## 2021-08-12 DIAGNOSIS — R69 Illness, unspecified: Secondary | ICD-10-CM | POA: Diagnosis not present

## 2021-08-12 DIAGNOSIS — Z1211 Encounter for screening for malignant neoplasm of colon: Secondary | ICD-10-CM

## 2021-08-12 DIAGNOSIS — Z0000 Encounter for general adult medical examination without abnormal findings: Secondary | ICD-10-CM | POA: Insufficient documentation

## 2021-08-12 DIAGNOSIS — F1010 Alcohol abuse, uncomplicated: Secondary | ICD-10-CM

## 2021-08-12 DIAGNOSIS — Z23 Encounter for immunization: Secondary | ICD-10-CM | POA: Diagnosis not present

## 2021-08-12 NOTE — Progress Notes (Signed)
Subjective:    Patient ID: William Whitaker, male    DOB: 09/02/1960, 61 y.o.   MRN: [id number]  DOS:  08/12/2021 Type of visit - description: Follow-up  Since the last office visit is doing okay. Has a postsurgical hernia.  He is concerned about it.   Review of Systems Still drinking alcohol sometimes. Denies any nausea or vomiting.  No blood in the stools. Shortness of breath at baseline, occasional cough and sputum production. Still smoking.  Past Medical History:  Diagnosis Date   Anxiety    Bronchitis    GERD (gastroesophageal reflux disease)    Insomnia     Past Surgical History:  Procedure Laterality Date   APPENDECTOMY  01/12/2021   open   BIOPSY  01/27/2019   Procedure: BIOPSY;  Surgeon: Irving Copas., MD;  Location: [facility];  Service: Gastroenterology;;   COLONOSCOPY WITH PROPOFOL N/A 01/27/2019   Procedure: COLONOSCOPY WITH PROPOFOL;  Surgeon: Irving Copas., MD;  Location: [facility];  Service: Gastroenterology;  Laterality: N/A;   ESOPHAGOGASTRODUODENOSCOPY (EGD) WITH PROPOFOL N/A 01/27/2019   Procedure: ESOPHAGOGASTRODUODENOSCOPY (EGD) WITH PROPOFOL;  Surgeon: Rush Landmark Telford Nab., MD;  Location: [facility];  Service: Gastroenterology;  Laterality: N/A;   HERNIA REPAIR Right remotely as a baby   POLYPECTOMY  01/27/2019   Procedure: POLYPECTOMY;  Surgeon: Irving Copas., MD;  Location: [facility];  Service: Gastroenterology;;    Current Outpatient Medications  Medication Instructions   albuterol (VENTOLIN HFA) 108 (90 Base) MCG/ACT inhaler USE 2 INHALATIONS BY MOUTH  EVERY 6 HOURS AS NEEDED   Cholecalciferol (VITAMIN D3) 50 MCG (2000 UT) TABS 1 tablet, Oral, Daily   ferrous sulfate 325 mg, Oral, Daily with breakfast   furosemide (LASIX) 20 mg, Oral, Daily   Glycopyrrolate-Formoterol (BEVESPI AEROSPHERE) 9-4.8 MCG/ACT AERO 2 puffs, Inhalation, Daily   HYDROcodone-acetaminophen (NORCO/VICODIN) 5-325 MG tablet 1 tablet,  Oral, 3 times daily PRN   hydrOXYzine (ATARAX) 10 mg, Oral, 3 times daily PRN   ipratropium (ATROVENT) 0.02 % nebulizer solution Use 1 vial via nebulizer 4 times daily   Magnesium 250 MG TABS 1 tablet, Oral, Daily   Multiple Vitamins-Minerals (ONE-A-DAY MENS VITACRAVES) CHEW 1 tablet, Oral, Daily   omeprazole (PRILOSEC) 20 MG capsule TAKE 1 CAPSULE BY MOUTH  DAILY   potassium chloride SA (KLOR-CON M) 20 MEQ tablet TAKE 1 TABLET BY MOUTH  DAILY   propranolol (INDERAL) 20 MG tablet TAKE 1 TABLET BY MOUTH TWICE A DAY   spironolactone (ALDACTONE) 50 MG tablet TAKE 1 TABLET BY MOUTH EVERY DAY   topiramate (TOPAMAX) 25 mg, Oral, Daily   vitamin C (ASCORBIC ACID) 250 mg, Oral, Daily   Vitamin Mixture (VITAMIN E COMPLETE PO) Oral       Objective:   Physical Exam BP 140/80 (BP Location: Right Arm, Patient Position: Sitting, Cuff Size: Normal)    Pulse 78    Temp 98.4 F (36.9 C) (Oral)    Resp 16    Ht 6\' 1"  (1.854 m)    Wt 207 lb (93.9 kg)    SpO2 96%    BMI 27.31 kg/m  General:   Well developed, NAD, BMI noted.  HEENT:  Normocephalic . Face symmetric, atraumatic Lungs:  CTA B Normal respiratory effort, no intercostal retractions, no accessory muscle use. Heart: RRR,  no murmur.  Abdomen:  Not distended, soft, non-tender.  Has a postsurgical hernia proximal to the umbilicus.  Reducible. Skin: Not pale. Not jaundice Lower extremities: no pretibial edema bilaterally  Neurologic:  alert & oriented X3.  Speech normal, gait appropriate for age and unassisted Psych--  Cognition and judgment appear intact.  Cooperative with normal attention span and concentration.  Behavior appropriate. No anxious or depressed appearing.     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 Hep C treated and quit alcohol. Tobacco abuse Anemia, severe. Perforated  appendix 12-2020   PLAN Here for routine follow-up COPD: Still smoking, not ready to quit. Sxs at baseline, on Bevespi, ipratropium. Anxiety, insomnia: On Atarax as needed. History of cirrhosis, EtOH: no GI f/u scheduled.  Explained that I recommend a referral because he is due for a colonoscopy and needs routine checkups.  Referral sent.  We will get a CMP and CBC. History of anemia: On iron, checking iron and ferritin.   Lung nodule: Found on CT 12-2020 when he was admitted with a ruptured appendix.  Did not pursue pulmonary referral.  We will try again.   Abdominal hernia: Postsurgical, recommend to see surgery.  Red flag symptoms discussed EtOH, tobacco: Complete abstinence recommended Preventive care reviewed: Tdap, Shingrix, pnm 23, Covid VAX booster: Benefits, pros/cons discussed, only accepted flu shot.   Tubular adenomas: Next colonoscopy 2023.  GI referral Prostate cancer screening: Last PSA was 2020, will recheck. Encouraged to schedule a formal CPX in 3 months   This visit occurred during the SARS-CoV-2 public health emergency.  Safety protocols were in place, including screening questions prior to the visit, additional usage of staff PPE, and extensive cleaning of exam room while observing appropriate contact time as indicated for disinfecting solutions.

## 2021-08-12 NOTE — Patient Instructions (Addendum)
We are referring you to the gastroenterologist in [place] for follow-up of your liver disease and colonoscopy  We are referring you to a pulmonary doctor in [place] to see about your spot in the lung  Please reach out to your surgeons in [place] regards the hernia.  If you ever unable to push it back, you have severe nausea, vomiting, stomach pain: Go to the ER  GO TO THE LAB : Get the blood work     [place], [place] back for a physical exam with me in 3 months

## 2021-08-13 LAB — COMPREHENSIVE METABOLIC PANEL
AG Ratio: 1.3 (calc) (ref 1.0–2.5)
ALT: 15 U/L (ref 9–46)
AST: 26 U/L (ref 10–35)
Albumin: 4.6 g/dL (ref 3.6–5.1)
Alkaline phosphatase (APISO): 87 U/L (ref 35–144)
BUN: 10 mg/dL (ref 7–25)
CO2: 26 mmol/L (ref 20–32)
Calcium: 9.4 mg/dL (ref 8.6–10.3)
Chloride: 99 mmol/L (ref 98–110)
Creat: 0.84 mg/dL (ref 0.70–1.35)
Globulin: 3.6 g/dL (calc) (ref 1.9–3.7)
Glucose, Bld: 88 mg/dL (ref 65–99)
Potassium: 4.2 mmol/L (ref 3.5–5.3)
Sodium: 135 mmol/L (ref 135–146)
Total Bilirubin: 0.9 mg/dL (ref 0.2–1.2)
Total Protein: 8.2 g/dL — ABNORMAL HIGH (ref 6.1–8.1)

## 2021-08-13 LAB — CBC
HCT: 41.6 % (ref 38.5–50.0)
Hemoglobin: 14.5 g/dL (ref 13.2–17.1)
MCH: 30.6 pg (ref 27.0–33.0)
MCHC: 34.9 g/dL (ref 32.0–36.0)
MCV: 87.8 fL (ref 80.0–100.0)
MPV: 10.7 fL (ref 7.5–12.5)
Platelets: 144 Thousand/uL (ref 140–400)
RBC: 4.74 Million/uL (ref 4.20–5.80)
RDW: 15.3 % — ABNORMAL HIGH (ref 11.0–15.0)
WBC: 6.7 Thousand/uL (ref 3.8–10.8)

## 2021-08-13 LAB — IRON: Iron: 176 ug/dL (ref 50–180)

## 2021-08-13 LAB — FERRITIN: Ferritin: 22 ng/mL — ABNORMAL LOW (ref 24–380)

## 2021-08-13 LAB — PSA: PSA: 0.16 ng/mL (ref <=4.00)

## 2021-08-14 NOTE — Assessment & Plan Note (Signed)
Here for routine follow-up COPD: Still smoking, not ready to quit. Sxs at baseline, on Bevespi, ipratropium. Anxiety, insomnia: On Atarax as needed. History of cirrhosis, EtOH: no GI f/u scheduled.  Explained that I recommend a referral because he is due for a colonoscopy and needs routine checkups.  Referral sent.  We will get a CMP and CBC. History of anemia: On iron, checking iron and ferritin.   Lung nodule: Found on CT 12-2020 when he was admitted with a ruptured appendix.  Did not pursue pulmonary referral.  We will try again.   Abdominal hernia: Postsurgical, recommend to see surgery.  Red flag symptoms discussed EtOH, tobacco: Complete abstinence recommended Preventive care reviewed: Tdap, Shingrix, pnm 23, Covid VAX booster: Benefits, pros/cons discussed, only accepted flu shot.   Tubular adenomas: Next colonoscopy 2023.  GI referral Prostate cancer screening: Last PSA was 2020, will recheck. Encouraged to schedule

## 2021-08-15 ENCOUNTER — Other Ambulatory Visit: Payer: Self-pay | Admitting: Internal Medicine

## 2021-08-15 ENCOUNTER — Encounter: Payer: Self-pay | Admitting: Internal Medicine

## 2021-08-18 ENCOUNTER — Other Ambulatory Visit: Payer: Self-pay | Admitting: Internal Medicine

## 2021-08-24 ENCOUNTER — Telehealth: Payer: Self-pay

## 2021-08-24 NOTE — Telephone Encounter (Signed)
Pt called regarding lab results from 08/12/2021- informed of results and recommendations.

## 2021-08-30 DIAGNOSIS — F32A Depression, unspecified: Secondary | ICD-10-CM

## 2021-08-30 DIAGNOSIS — R69 Illness, unspecified: Secondary | ICD-10-CM | POA: Diagnosis not present

## 2021-08-30 DIAGNOSIS — J449 Chronic obstructive pulmonary disease, unspecified: Secondary | ICD-10-CM

## 2021-09-02 ENCOUNTER — Encounter: Payer: Self-pay | Admitting: Internal Medicine

## 2021-09-15 DIAGNOSIS — K432 Incisional hernia without obstruction or gangrene: Secondary | ICD-10-CM | POA: Diagnosis not present

## 2021-09-15 DIAGNOSIS — J449 Chronic obstructive pulmonary disease, unspecified: Secondary | ICD-10-CM | POA: Diagnosis not present

## 2021-09-15 DIAGNOSIS — I10 Essential (primary) hypertension: Secondary | ICD-10-CM | POA: Diagnosis not present

## 2021-09-15 DIAGNOSIS — Z720 Tobacco use: Secondary | ICD-10-CM | POA: Diagnosis not present

## 2021-09-20 ENCOUNTER — Telehealth: Payer: Self-pay | Admitting: Internal Medicine

## 2021-09-20 NOTE — Telephone Encounter (Signed)
Pt is requesting new meds for the 2 hernia discussed within the previous ov. Pt spoke with Dr. Dema Severin who will be preforming another surgery to have hernia's removed but will not prescribe meds until afterwards please advise  ?

## 2021-09-21 NOTE — Telephone Encounter (Signed)
LMOM informing Pt of recommendations. Instructed to call if questions/concerns.  

## 2021-09-21 NOTE — Telephone Encounter (Signed)
For pain management: ?Recommend Tylenol 500 mg 1 or 2 tablets twice daily as needed. ?I read the surgical note, they are planning to operate on him once he stops tobacco, he was referred to a smoking cessation clinic, strongly recommend to pursue referral. ?

## 2021-09-23 DIAGNOSIS — K439 Ventral hernia without obstruction or gangrene: Secondary | ICD-10-CM | POA: Diagnosis not present

## 2021-09-23 DIAGNOSIS — K432 Incisional hernia without obstruction or gangrene: Secondary | ICD-10-CM | POA: Diagnosis not present

## 2021-09-23 DIAGNOSIS — R160 Hepatomegaly, not elsewhere classified: Secondary | ICD-10-CM | POA: Diagnosis not present

## 2021-10-12 ENCOUNTER — Other Ambulatory Visit: Payer: Self-pay | Admitting: Internal Medicine

## 2021-10-13 ENCOUNTER — Other Ambulatory Visit: Payer: Self-pay | Admitting: Internal Medicine

## 2021-11-01 ENCOUNTER — Telehealth: Payer: Self-pay | Admitting: Internal Medicine

## 2021-11-01 NOTE — Telephone Encounter (Signed)
Just an FYI

## 2021-11-01 NOTE — Telephone Encounter (Signed)
Thank you for the update!

## 2021-11-01 NOTE — Telephone Encounter (Signed)
FYI ? ?Pt called stating that he had oral surgery a little while ago and the surgeon put him on motrin and hydrocodone to manage the pain. Pt also stated that he has quit smoking and is using nicotine patches to help manage cravings. Pt wanted Dr. Larose Kells to be informed of his condition when possible.  ?

## 2021-11-03 ENCOUNTER — Other Ambulatory Visit: Payer: Self-pay | Admitting: Internal Medicine

## 2021-11-04 ENCOUNTER — Ambulatory Visit (INDEPENDENT_AMBULATORY_CARE_PROVIDER_SITE_OTHER): Payer: Medicare HMO | Admitting: Pharmacist

## 2021-11-04 DIAGNOSIS — D649 Anemia, unspecified: Secondary | ICD-10-CM

## 2021-11-04 DIAGNOSIS — K7460 Unspecified cirrhosis of liver: Secondary | ICD-10-CM

## 2021-11-04 DIAGNOSIS — J449 Chronic obstructive pulmonary disease, unspecified: Secondary | ICD-10-CM

## 2021-11-04 DIAGNOSIS — F17200 Nicotine dependence, unspecified, uncomplicated: Secondary | ICD-10-CM

## 2021-11-04 NOTE — Patient Instructions (Signed)
Mr. Banka ?It was a pleasure speaking with you  ?Below is a summary of your health goals and care plan ? ?If you have any questions or concerns, please feel free to contact me either at the phone number below or with a MyChart message.  ? ?Keep up the good work! ? ?Cherre Robins, PharmD ?Clinical Pharmacist ?[facility] ?[place] ?[phone] (direct line)  ?[phone] (main office number) ? ? ?Chronic Care Management Care Plan ? ?COPD/Tobacco Use Disorder ?Pharmacist Clinical Goal(s) ?Over the next 90 days, patient will work with PharmD and providers to create an affordable treatment plan for his inhaler regimen ?Over the next 90 days, patient will decrease/eliminate his use of cigarettes ?Current regimen:  ?Bevespi Inhaler - inhale 2 puffs twice a day (Not taking due to cost) ?Albuterol inhaler - 2 puffs every 6 hours as needed ?Ipratropium 0.02% nebulized solution or inhaler up to 4 times a day as needed ?Interventions:  ?Applied for medication assistance program for Bevespi in Feb 2023 but per [place] and [place] appication not received. Refaxed today. ?Continue to use ipratropium with nebulizer and albuterol inhaler as needed ?Discussed possible side effect of Zyn pouches - gum irritation, sore mouth, hiccups and nausea.  ?Discussed getting nicotine patches with his [employer] over-the-counter benefits. Patient provided with information from [place] to order. Has $110 to spend per quarter on over-the-counter products.  ?  ? ?Anxiety ?Pharmacist Clinical Goal(s) ?Over the next 90 days, patient will work with PharmD and providers to reduce symptoms/limitations of anxiety ?Current regimen:  ?Hydroxyzine '10mg'$  3 times a day as needed for anxiety or sleep.  ?Patient self care activities - Over the next 90 days, patient will: ?Continue hydroxyzine for anxiety ? ?Health Maintenance:  ?Pharmacist Clinical Goal(s) ?Over the next 90 days, patient will work with PharmD and providers to maintain  health ?Interventions: ?Reviewed vaccination records ?Discussed needed vaccines ?Patient self care activities - Over the next 30 days, patient will: ?Get Shingrix and Tetanus vaccine (should be covered by Medicare for 2023)  ?Get updated COVID vaccine ? ?Medication management ?Pharmacist Clinical Goal(s): ?Over the next 90 days, patient will work with PharmD and providers to maintain optimal medication adherence ?Current pharmacy: [facility] ?Interventions ?Comprehensive medication review performed. ?Continue current medication management strategy ?Patient self care activities - Over the next 90 days, patient will: ?Focus on medication adherence by filling medications appropriately ?Take medications as prescribed ?Report any questions or concerns to PharmD and/or provider(s) ?Reviewed 2023 formulary for [employer]:  ?Tier 1 medications = $0: furosemide, spironolactone, ipratropium solution for nebulizer and omeprazole ?Tier 2 medications = $5 / $15 copay: albuterol, potassium chloride; propranolol ?Tier 3 medications = $15 / $45 copay; hydrocodone/acetaminophen ?Tier 4 medication = $45 / $135 copay: hydroxyzine; Atrovent HFA; Spiriva Respima ? ? ?Patient Goals/Self-Care Activities ?take medications as prescribed ?focus on medication adherence by pill count ?collaborate with provider on medication access solutions ?I have refaxed application for Bevespi inhaler ?Contact 1-800- [place] [phone]) if needed ?Order nicotine patches with [employer] over-the-counter benefits (info located on page 18 of 2023 [employer] over-the-counter catalog) ? ? ?Follow Up Plan: Clinical pharmacist follow up in 1 to 2 months. ? ?Please see past updates related to this goal by clicking on the "Past Updates" button in the selected goal  ? ?The patient verbalized understanding of instructions, educational materials, and care plan provided today and agreed to receive a mailed copy of patient instructions, educational materials, and care plan.    ?

## 2021-11-04 NOTE — Chronic Care Management (AMB) (Signed)
? ? ?Chronic Care Management ?Pharmacy Note ? ?11/04/2021 ?Name:  William Whitaker MRN:  [id number] DOB:  1961/03/05 ? ?Summary: ?COPD: Continues to have difficulty affording COPD therapy. Applied for [employer] and Me program (faxed application 40/98/1191) but [place] and Me reports today that they do not have record of application. Refaxed application today. ?Tobacco Use Disorder: Patient reports that he has quit smoking for the last 1.5 weeks. He has been using over-the-counter nicotine patches that he got from his father - 63m and 170mdaily. He also is using Zyn nicotine pouches which are nicotine pouches with flavoring, sweetener and plant fiber; Does not contain tobacco. Absorbed thru the mucosal membrane and usKoreasing as needed for cravings. Patient has recently had oral surgery and I recommended he use Zyn pouches sparingly while healing. Discussed possible side effect of Zyn pouches - gum irritation, sore mouth, hiccups and nausea. Also discussed getting nicotine patches with his [employer] over-the-counter benefits. Patient provided with information form [employer] catalog to order. He was unaware nicotine products were available. He has $110 to spend per quarter on over-the-counter products. Also discussed support for smoking cessation. He has 2 other friends / family members that are quitting smoking also and they have phone calls frequently to help each other. He was provided with QUIT Now contact information.  ? ?Patient will meet with surgeon in 2 weeks to discuss abdominal surgery to repair hernia.  ? ?Subjective: ?William HONEYMANs an [age].o. year old male who is a primary patient of Paz, JoAlda BertholdMD.  The CCM team was consulted for assistance with disease management and care coordination needs.   ? ?Engaged with patient by telephone for follow up visit in response to provider referral for pharmacy case management and/or care coordination services.  ? ?Consent to Services:  ?The patient was given information about  Chronic Care Management services, agreed to services, and gave verbal consent prior to initiation of services.  Please see initial visit note for detailed documentation.  ? ?Patient Care Team: ?PaColon BranchMD as PCP - General (Internal Medicine) ?EcCherre RobinsRPH-CPP (Pharmacist) ? ?Recent office visits: ?08/12/2021 - Int Med (Dr PaLarose Kellsseen for follow up of chronic conditions. Discussed several health maintenance items and referral placed for GI and pulmonlary;  labs checked. F/U 3 months ?01/31/2021 - PCP (Dr PaLarose KellsF/U perforated appendix / hernia repair. Refill hydrocodone / acetaminophen for pain. Referred to pulmonology due to new finding of nodule on lung CT.  ? ?Recent consult visits: ?[facility]20[facility] 1. Two midline ventral hernias. The more inferior hernia contains a loop of nonobstructed small bowel which approximates the skin surface. The hernias at the level of the umbilicus.2. Enlarged nodular liver consistent with cirrhosis.3. No ascites ?09/15/2021 - [facility] appendectomy in July 2022. Noted bulge about 3 months ago and has continued to get larger. Recommended open repair of hernia. Recommended discontinuing smoking. Referred to smoking cessation clinic. F/U 2 months to further discuss surgery. ?03/01/2021 - [facility] s/p appendectomy - wound check. Daily fiber supplementation, adequate hydration, and frequent ambulation were recommended. ? ? ?Hospital visits: ?None in last 6 months.  ? ? ?Objective: ? ?Lab Results  ?Component Value Date  ? CREATININE 0.84 08/12/2021  ? CREATININE 0.71 01/31/2021  ? CREATININE 0.78 09/29/2020  ? ? ?Lab Results  ?Component Value Date  ? HGBA1C 5.3 09/29/2020  ? ?  Last diabetic Eye exam: No results found for: HMDIABEYEEXA  ?Last diabetic Foot exam: No results found for: HMDIABFOOTEX  ? ?No results found for: CHOL, TRIG, HDL, CHOLHDL, VLDL, LDLCALC,  LDLDIRECT ? ? ?  Latest Ref Rng & Units 08/12/2021  ?  1:56 PM 01/31/2021  ? 12:05 PM 09/29/2020  ?  2:59 PM  ?Hepatic Function  ?Total Protein 6.1 - 8.1 g/dL 8.2   7.7   8.0    ?Albumin 3.5 - 5.2 g/dL  4.0   4.4    ?AST 10 - 35 U/L _0 ?ALT 9 - 46 U/L _1 ?Alk Phosphatase 39 - 117 U/L  99   82    ?Total Bilirubin 0.2 - 1.2 mg/dL 0.9   0.6   0.7    ? ? ?Lab Results  ?Component Value Date/Time  ? TSH 1.46 09/29/2020 02:59 PM  ? TSH 3.002 01/25/2019 05:44 AM  ? ? ? ?  Latest Ref Rng & Units 08/12/2021  ?  1:56 PM 01/31/2021  ? 12:05 PM 09/29/2020  ?  2:59 PM  ?CBC  ?WBC 3.8 - 10.8 Thousand/uL 6.7   5.7   6.6    ?Hemoglobin 13.2 - 17.1 g/dL 14.5   12.2   14.4    ?Hematocrit 38.5 - 50.0 % 41.6   36.9   42.8    ?Platelets 140 - 400 Thousand/uL 144   223.0   143.0    ? ? ?No results found for: VD25OH ? ?Clinical ASCVD: No  ?The ASCVD Risk score (Arnett DK, et al., 2019) failed to calculate for the following reasons: ?  Cannot find a previous HDL lab ?  Cannot find a previous total cholesterol lab   ? ? ?Social History  ? ?Tobacco Use  ?Smoking Status Former  ? Packs/day: 0.50  ? Years: 30.00  ? Pack years: 15.00  ? Types: Cigarettes  ? Quit date: 10/24/2021  ? Years since quitting: 0.0  ?Smokeless Tobacco Never  ?Tobacco Comments  ? 3 PPD, started to decrease to 1/2 ppd 2019, 2021 now smoking 1/4 ppd; 10/2021 quit smoking and is using nicotine patches and Zyn nicotine pouches.   ? ?BP Readings from Last 3 Encounters:  ?08/12/21 140/80  ?01/31/21 124/80  ?09/29/20 (!) 144/70  ? ?Pulse Readings from Last 3 Encounters:  ?08/12/21 78  ?01/31/21 93  ?09/29/20 78  ? ?Wt Readings from Last 3 Encounters:  ?08/12/21 207 lb (93.9 kg)  ?03/02/21 194 lb (88 kg)  ?01/31/21 194 lb 2 oz (88.1 kg)  ? ? ?Assessment: Review of patient past medical history, allergies, medications, health status, including review of consultants reports, laboratory and other test data, was performed as part of comprehensive evaluation and  provision of chronic care management services.  ? ?SDOH:  (Social Determinants of Health) assessments and interventions performed:  ?SDOH Interventions   ? ?Flowsheet Row Most Recent Value  ?SDOH Interventions   ?Financial Strain Interventions Other (Comment)  [assisting with [place] for Bevespi]  ? ?  ? ? ? ?[place] ? ?No Known Allergies ? ?Medications Reviewed Today   ? ? Reviewed by Cherre Robins, RPH-CPP (Pharmacist) on 11/04/21 at 1201  Med List Status: <None>  ? ?Medication Order Taking? Sig Documenting Provider Last Dose Status Informant  ?albuterol (VENTOLIN HFA) 108 (90 Base) MCG/ACT inhaler [id number] Yes TAKE 2 PUFFS BY MOUTH EVERY  6 HOURS AS NEEDED FOR WHEEZE OR SHORTNESS OF BREATH Copland, Gay Filler, MD Taking Active   ?chlorhexidine (PERIDEX) 0.12 % solution [id number] Yes 2 (two) times daily. [provider] Taking Active   ?Cholecalciferol (VITAMIN D3) 50 MCG (2000 UT) TABS [id number] Yes Take 1 tablet by mouth daily. [provider] Taking Active   ?ferrous sulfate 325 (65 FE) MG tablet [id number] Yes Take 1 tablet (325 mg total) by mouth daily with breakfast. Colon Branch, MD Taking Active   ?furosemide (LASIX) 20 MG tablet [id number] Yes TAKE 1 TABLET BY MOUTH EVERY DAY Colon Branch, MD Taking Active   ?Glycopyrrolate-Formoterol (BEVESPI AEROSPHERE) 9-4.8 MCG/ACT AERO [id number] No Inhale 2 puffs into the lungs daily.  ?Patient not taking: Reported on 11/04/2021  ? Colon Branch, MD Not Taking Active   ?         ?Med Note Antony Contras, Shaneeka Scarboro B   Thu Aug 04, 2021  2:05 PM) Will start in place of Spiriva is approved by patient assistance program   ?HYDROcodone-acetaminophen (NORCO/VICODIN) 5-325 MG tablet [id number] Yes Take 1 tablet by mouth 3 (three) times daily as needed. Colon Branch, MD Taking Active   ?hydrOXYzine (ATARAX/VISTARIL) 10 MG tablet [id number] Yes Take 1 tablet (10 mg total) by mouth 3 (three) times daily as needed for anxiety. Colon Branch, MD Taking Active    ?ibuprofen (ADVIL) 800 MG tablet [id number] Yes Take 800 mg by mouth every 8 (eight) hours as needed. [provider] Taking Active   ?ipratropium (ATROVENT) 0.02 % nebulizer solution [id number] No USE 1 VIAL V

## 2021-11-15 DIAGNOSIS — K432 Incisional hernia without obstruction or gangrene: Secondary | ICD-10-CM | POA: Diagnosis not present

## 2021-11-16 ENCOUNTER — Telehealth: Payer: Self-pay | Admitting: Internal Medicine

## 2021-11-16 NOTE — Telephone Encounter (Signed)
I might be able to prescribe medication after I review the chart. ?He is due for a physical exam, please arrange ?

## 2021-11-16 NOTE — Telephone Encounter (Signed)
Addendum: I review the surgical note, they did request the patient to stop tobacco, no mention about prescribing Wellbutrin or Xanax. ?

## 2021-11-16 NOTE — Telephone Encounter (Signed)
Spoke w/ Pt- informed to keep appt scheduled for 5/22 w/ PCP- he informed he would try to get here- has been having car trouble- recommended he call a friend, neighbor to bring him. He states he is not sleeping, and admits to being very anxious- he states he will take something that was given to him by family friend for his nervous- I strongly recommended he not do that and to take only what is prescribed by providers for him. Pt again states he has to do something for sleep and his anxiety- that he trusts this person.  ?

## 2021-11-16 NOTE — Telephone Encounter (Signed)
Pt called stating that his hernia surgeon was recommending Dr. Larose Kells prescribe him wellbutrin and alprazeolam to help with anxiety concerning the surgery. This is after his surgeon wanted him to be completely off nicotine before the surgery including any patches, pouches, etc. Please Advise. ?

## 2021-11-21 ENCOUNTER — Inpatient Hospital Stay: Payer: Medicare HMO | Admitting: Internal Medicine

## 2021-11-30 DIAGNOSIS — F32A Depression, unspecified: Secondary | ICD-10-CM

## 2021-11-30 DIAGNOSIS — R69 Illness, unspecified: Secondary | ICD-10-CM | POA: Diagnosis not present

## 2021-11-30 DIAGNOSIS — D649 Anemia, unspecified: Secondary | ICD-10-CM

## 2021-11-30 DIAGNOSIS — F17210 Nicotine dependence, cigarettes, uncomplicated: Secondary | ICD-10-CM | POA: Diagnosis not present

## 2021-11-30 DIAGNOSIS — J449 Chronic obstructive pulmonary disease, unspecified: Secondary | ICD-10-CM | POA: Diagnosis not present

## 2021-11-30 DIAGNOSIS — F419 Anxiety disorder, unspecified: Secondary | ICD-10-CM

## 2021-12-05 ENCOUNTER — Ambulatory Visit (INDEPENDENT_AMBULATORY_CARE_PROVIDER_SITE_OTHER): Payer: Medicare HMO | Admitting: Pharmacist

## 2021-12-05 DIAGNOSIS — K7460 Unspecified cirrhosis of liver: Secondary | ICD-10-CM

## 2021-12-05 DIAGNOSIS — J449 Chronic obstructive pulmonary disease, unspecified: Secondary | ICD-10-CM

## 2021-12-05 DIAGNOSIS — F17200 Nicotine dependence, unspecified, uncomplicated: Secondary | ICD-10-CM

## 2021-12-05 NOTE — Chronic Care Management (AMB) (Signed)
Chronic Care Management Pharmacy Note  12/05/2021 Name:  William Whitaker MRN:  [id number] DOB:  December 16, 1960  Summary: COPD: Continues to have difficulty affording COPD therapy. Applied for [employer] thru [place] and Me program (faxed application [id number]/[phone] and refaxed 11/05/2021) but was denied. Called [place] and [place] and was denied because their searched showed patient has a [place]. Patient reports he only has [employer]. Submitted appeal to [facility] and Me (Case # [id number]. Tobacco Use Disorder: Patient reports that he has quit smoking for the last 5.5 weeks. He had been using over-the-counter nicotine patches but his surgeon states that he wants no nicotine in his system prior to surgery. Patient asked about Wellbutrin. Would need appointment with Dr Larose Kells but per patient he was exposed 12/02/2021 to his niece and nephew who have [place]. Patient is wearing mask. Discussed protocol - he should continue to mask and monitor for symptoms of COVID. Recommended test for COVID 12/08/2021 and continue to mask until 12/12/2021. Will see if Dr Larose Kells could possibly do a video or phone visit with patient regarding smoking cessation.   Subjective: William Whitaker is a 61 y.o. year old male who is a primary patient of Paz, Alda Berthold, MD.  The CCM team was consulted for assistance with disease management and care coordination needs.    Engaged with patient by telephone for follow up visit in response to provider referral for pharmacy case management and/or care coordination services.   Consent to Services:  The patient was given information about Chronic Care Management services, agreed to services, and gave verbal consent prior to initiation of services.  Please see initial visit note for detailed documentation.   Patient Care Team: Colon Branch, MD as PCP - General (Internal Medicine) Cherre Robins, [place] (Pharmacist)  Recent office visits: 08/12/2021 - Int Med (Dr Larose Kells) seen for follow up of  chronic conditions. Discussed several health maintenance items and referral placed for GI and pulmonlary;  labs checked. F/U 3 months 01/31/2021 - PCP (Dr Larose Kells) F/U perforated appendix / hernia repair. Refill hydrocodone / acetaminophen for pain. Referred to pulmonology due to new finding of nodule on lung CT.   Recent consult visits: 11/15/2021 - Surgeon (Dr Dema Severin - [facility]) Follow up large incisional hernia after open appendectomy that will require an open component separation. Requires a negative nicotine test prior to operative intervention. Tentatively plan on component separation on July 3rd. He will return on June 20th for nicotine test and consent 09/23/2021 - CT Abdomen - [facility] IMPRESSION: 1. Two midline ventral hernias. The more inferior hernia contains a loop of nonobstructed small bowel which approximates the skin surface. The hernias at the level of the umbilicus.2. Enlarged nodular liver consistent with cirrhosis.3. No ascites 09/15/2021 - Gen Surgery (Dr Dema Severin - [facility]) F/U incisional hernia after appendectomy in July 2022. Noted bulge about 3 months ago and has continued to get larger. Recommended open repair of hernia. Recommended discontinuing smoking. Referred to smoking cessation clinic. F/U 2 months to further discuss surgery. 03/01/2021 - [facility] ([facility]) s/p appendectomy - wound check. Daily fiber supplementation, adequate hydration, and frequent ambulation were recommended.   Hospital visits: None in last 6 months.    Objective:  Lab Results  Component Value Date   CREATININE 0.84 08/12/2021   CREATININE 0.71 01/31/2021   CREATININE 0.78 09/29/2020    Lab Results  Component Value Date   HGBA1C 5.3 09/29/2020   Last diabetic  Eye exam: No results found for: HMDIABEYEEXA  Last diabetic Foot exam: No results found for: HMDIABFOOTEX   No results found for: CHOL, TRIG, HDL, CHOLHDL, VLDL, LDLCALC, LDLDIRECT     Latest Ref Rng & Units  08/12/2021    1:56 PM 01/31/2021   12:05 PM 09/29/2020    2:59 PM  Hepatic Function  Total Protein 6.1 - 8.1 g/dL 8.2   7.7   8.0    Albumin 3.5 - 5.2 g/dL  4.0   4.4    AST 10 - 35 U/L _0 ALT 9 - 46 U/L _1 Alk Phosphatase 39 - 117 U/L  99   82    Total Bilirubin 0.2 - 1.2 mg/dL 0.9   0.6   0.7      Lab Results  Component Value Date/Time   TSH 1.46 09/29/2020 02:59 PM   TSH 3.002 01/25/2019 05:44 AM       Latest Ref Rng & Units 08/12/2021    1:56 PM 01/31/2021   12:05 PM 09/29/2020    2:59 PM  CBC  WBC 3.8 - 10.8 Thousand/uL 6.7   5.7   6.6    Hemoglobin 13.2 - 17.1 g/dL 14.5   12.2   14.4    Hematocrit 38.5 - 50.0 % 41.6   36.9   42.8    Platelets 140 - 400 Thousand/uL 144   223.0   143.0      No results found for: VD25OH  Clinical ASCVD: No  The ASCVD Risk score (Arnett DK, et al., 2019) failed to calculate for the following reasons:   Cannot find a previous HDL lab   Cannot find a previous total cholesterol lab     Social History   Tobacco Use  Smoking Status Former   Packs/day: 0.50   Years: 30.00   Pack years: 15.00   Types: Cigarettes   Quit date: 10/24/2021   Years since quitting: 0.1  Smokeless Tobacco Never  Tobacco Comments   3 PPD, started to decrease to 1/2 ppd 2019, 2021 now smoking 1/4 ppd; 10/2021 quit smoking and is using nicotine patches and Zyn nicotine pouches.    BP Readings from Last 3 Encounters:  08/12/21 140/80  01/31/21 124/80  09/29/20 (!) 144/70   Pulse Readings from Last 3 Encounters:  08/12/21 78  01/31/21 93  09/29/20 78   Wt Readings from Last 3 Encounters:  08/12/21 207 lb (93.9 kg)  03/02/21 194 lb (88 kg)  01/31/21 194 lb 2 oz (88.1 kg)    Assessment: Review of patient past medical history, allergies, medications, health status, including review of consultants reports, laboratory and other test data, was performed as part of comprehensive evaluation and provision of chronic care management  services.   SDOH:  (Social Determinants of Health) assessments and interventions performed:      CCM Care Plan  No Known Allergies  Medications Reviewed Today     Reviewed by Cherre Robins, RPH-CPP (Pharmacist) on 12/05/21 at 1152  Med List Status: <None>   Medication Order Taking? Sig Documenting Provider Last Dose Status Informant  albuterol (VENTOLIN HFA) 108 (90 Base) MCG/ACT inhaler [id number] Yes TAKE 2 PUFFS BY MOUTH EVERY 6 HOURS AS NEEDED FOR WHEEZE OR SHORTNESS OF BREATH Copland, Gay Filler, MD Taking Active   chlorhexidine (PERIDEX) 0.12 % solution [id number] Yes 2 (two) times daily. [provider] Taking Active  Cholecalciferol (VITAMIN D3) 50 MCG (2000 UT) TABS [id number] Yes Take 1 tablet by mouth daily. [provider] Taking Active   ferrous sulfate 325 (65 FE) MG tablet [id number] Yes Take 1 tablet (325 mg total) by mouth daily with breakfast. Colon Branch, MD Taking Active   furosemide (LASIX) 20 MG tablet [id number] Yes TAKE 1 TABLET BY MOUTH EVERY DAY Colon Branch, MD Taking Active   Glycopyrrolate-Formoterol (BEVESPI AEROSPHERE) 9-4.8 MCG/ACT AERO [id number] No Inhale 2 puffs into the lungs daily.  Patient not taking: Reported on 11/04/2021   Colon Branch, MD Not Taking Active            Med Note Antony Contras, Adria Dill Aug 04, 2021  2:05 PM) Will start in place of Spiriva is approved by patient assistance program   HYDROcodone-acetaminophen (NORCO/VICODIN) 5-325 MG tablet [id number] No Take 1 tablet by mouth 3 (three) times daily as needed.  Patient not taking: Reported on 12/05/2021   Colon Branch, MD Not Taking Active   hydrOXYzine (ATARAX/VISTARIL) 10 MG tablet [id number] No Take 1 tablet (10 mg total) by mouth 3 (three) times daily as needed for anxiety.  Patient not taking: Reported on 12/05/2021   Colon Branch, MD Not Taking Active   ibuprofen (ADVIL) 800 MG tablet [id number] No Take 800 mg by mouth every 8 (eight) hours as needed.  Patient not taking:  Reported on 12/05/2021   [provider] Not Taking Active   ipratropium (ATROVENT) 0.02 % nebulizer solution [id number] No USE 1 VIAL VIA NEBULIZER 4 TIMES DAILY  Patient not taking: Reported on 11/04/2021   Colon Branch, MD Not Taking Active   Magnesium 250 MG TABS [id number] Yes Take 1 tablet by mouth daily. [provider] Taking Active   Multiple Vitamins-Minerals (ONE-A-DAY MENS VITACRAVES) CHEW [id number] Yes Chew 1 tablet by mouth daily. [provider] Taking Active Self  nicotine (NICODERM CQ - DOSED IN MG/24 HOURS) 21 mg/24hr patch [id number] No Place 21 mg onto the skin daily.  Patient not taking: Reported on 12/05/2021   [provider] Not Taking Active   omeprazole (PRILOSEC) 20 MG capsule [id number] Yes TAKE 1 CAPSULE BY MOUTH EVERY DAY Colon Branch, MD Taking Active   potassium chloride SA (KLOR-CON M) 20 MEQ tablet [id number] Yes TAKE 1 TABLET BY MOUTH DAILY Colon Branch, MD Taking Active   propranolol (INDERAL) 20 MG tablet [id number] Yes TAKE 1 TABLET BY MOUTH  TWICE DAILY Colon Branch, MD Taking Active   spironolactone (ALDACTONE) 50 MG tablet [id number] Yes TAKE 1 TABLET BY MOUTH  DAILY Colon Branch, MD Taking Active   topiramate (TOPAMAX) 25 MG tablet [id number] Yes TAKE 1 TABLET (25 MG TOTAL) BY MOUTH DAILY. Colon Branch, MD Taking Active   vitamin C (ASCORBIC ACID) 250 MG tablet [id number] Yes Take 250 mg by mouth daily. [provider] Taking Active Self  Vitamin Mixture (VITAMIN E COMPLETE PO) [id number] Yes Take by mouth. [provider] Taking Active             Patient Active Problem List   Diagnosis Date Noted   Annual physical exam 08/12/2021   Insomnia 09/29/2020   COPD (chronic obstructive pulmonary disease) ([place]) 04/09/2019   PCP NOTES >>>>>>>>>>>>>>>>>> 02/05/2019   Chronic hepatitis ([place]) 02/05/2019   EtOH dependence ([place]) 01/25/2019   Tobacco dependence 27/51/7001   Alcoholic cirrhosis of liver with ascites ([place])  01/25/2019   Hyponatremia 01/25/2019  Iron deficiency anemia    Symptomatic anemia 01/24/2019   Anxiety 08/20/2007   REACTIVE AIRWAY DISEASE 08/20/2007   G E R D 08/20/2007    Immunization History  Administered Date(s) Administered   H1N1 06/30/2008   Hep A / Hep B 06/30/2019   Hepatitis A, Adult 04/21/2019   Hepatitis B, ped/adol 03/11/2019   Influenza Inj Mdck Quad Pf 04/10/2019   Influenza,inj,Quad PF,6+ Mos 08/12/2021   Influenza,inj,Quad PF,6-35 Mos 08/12/2021   Janssen (J&J) SARS-COV-2 Vaccination 02/04/2020    Conditions to be addressed/monitored: COPD, Anxiety, and tobacco use disorder and cirrhosis  Care Plan : General Pharmacy (Adult)  Updates made by Cherre Robins, RPH-CPP since 12/05/2021 12:00 AM     Problem: COPD, Anxiety, Cirrhosis   Priority: High  Onset Date: 08/19/2020     Long-Range Goal: Patient-Specific Goal   Start Date: 08/19/2020  Expected End Date: 02/16/2021  This Visit's Progress: On track  Recent Progress: On track  Priority: High  Note:   Current Barriers:  Unable to independently afford treatment regimen Not taking Bevespi (maintenance inhaler) or using Atrovent inhaler due to cost  Pharmacist Clinical Goal(s):  Over the next 90 days, patient will verbalize ability to afford treatment regimen achieve adherence to monitoring guidelines and medication adherence to achieve therapeutic efficacy maintain control of COPD as evidenced by exacerbation frequency  through collaboration with PharmD and provider.   Interventions: 1:1 collaboration with Colon Branch, MD regarding development and update of comprehensive plan of care as evidenced by provider attestation and co-signature Inter-disciplinary care team collaboration (see longitudinal plan of care) Comprehensive medication review performed; medication list updated in electronic medical record  COPD (Goal: control symptoms and prevent exacerbations) Controlled / no recent COPD exacerbations  in last year that required treatment Current COPD Classification:  B (high sx, <2 exacerbations/yr) Current therapy:  Alubterol HFA - inhale 2 puffs up to every 6 hours as needed Atrovent (ipratropium) HFA - inhaler 2 puffs every 4 hours as needed  (not taking due to cost of medication) Atorvent (ipratropium) nebules - use 1 ampule in nebulizer 4 times a day as needed Bevespi - inhale 2 puffs into lungs twice a day (not taking due to cost of medication) Cost of medications for COPD has been barrier to adherence. Changed to [facility] plan for 2023 [phone]) but Spiriva non formulary. Atrovent HFA and Incruse are on formulary but still has $95 deductible and then $45 / month cost for either. Patient was denied [employer] assistance in 2022 because he income was above cut off. Checked resources ([place], [employer], Patient [employer]) for assistance with copay and deductible but no current funds open for COPD.  Dr Larose Kells agreed to trial of Charolotte Eke if patient approved for patient assistance program. Started patient assistance program application process. Called to checked on application today and was denied due to [place] coverage - however patient endorsed that he does not have commercial plan, only A[employer]na Medicare Patient reports he uses albuterol resuce inhaler about every other day  Interventions:  Recommended patient use ipratropium nebules twice a day  - cost through [place] is $0 for 2023 Continue to use albuterol HFA as needed.  Tier 1 medication - $5 per 30 days or $15 per 90 days Sub[place] for [place] for Bevespi  Tobacco Use: Quit smoking 10/2021 Current therapy:  none Surgeon has recommended smoking cessation prior to hernia surgery - must test negative for nicotine  so cannot use nicotine replacement products.  Intervention:  Discussed making appointment with PCP to discuss treatment options.  Assessed  support network available for smoking cessation. He has 2 other friends / family members that are quitting smoking as well and they have phone calls frequently to help each other. He was provided with QUIT NOW contact information.   Depression/Anxiety (Goal: Minimize symptoms) Improving Current treatment: Hydroxyzine 19m 3 times a day as needed Medications previously tried/failed: none noted Reports he takes when needed for sleep or if he needs to during the day. Interventions:  Recommended to continue current medication  Cirrhosis/Alcohol use disorder (Goal: prevent progression of cirrhosis) Controlled Managed by GI - Dr CBryan LemmaCurrent treatment  Furosemide 266mdaily Potassium 2037mdaily Omeprazole 22m53mily Propranolol 22mg26mce daily Spironolactone 50mg 57my Topiramate 25mg d43m Medications previously tried: none noted  Intervention:  Recommended to continue current medication  Anemia: Current therapy:  Ferrous sulfate 325mg da52mwith breakfast Interventions:  Continue current therapy  Medication management Pharmacist Clinical Goal(s): Over the next 90 days, patient will work with PharmD and providers to maintain optimal medication adherence Current pharmacy[facility] Interventions Comprehensive medication review performed. Reviewed refill history and assessed adherence Continue current medication management strategy Reviewed 2023 formulary for [employer] (at previous visit) Tier 1 medications = $0: furosemide, spironolactone, ipratropium solution for nebulizer and omeprazole Tier 2 medications = $5 / $15 copay: albuterol, potassium chloride; propranolol Tier 3 medications = $15 / $45 copay; hydrocodone/acetaminophen;  Tier 4 medication = $45 / $135 copay: hydroxyzine; Atrovent HFA; Spiriva / Bevespi Patient self care activities - Over the next 90 days, patient will: Focus on medication adherence by filling medications appropriately  Take medications as  prescribed Report any questions or concerns to PharmD and/or provider(s)   Patient Goals/Self-Care Activities Over the next 90 days, patient will:  take medications as prescribed focus on medication adherence by pill count collaborate with provider on medication access solutions We will submit an appeal for Bevespi inhaler patient assistance program  Contact 1-800- QUIT- NOTolley74144559232ded  Follow Up Plan: Clinical pharmacist follow up in 1 to 2 months.          Medication Assistance: Patient has insurance coverage for Incruse / Atrovent HFA but has $95 deductible + $45 copay. He might be able to use anticholinergic / LABA combo like Bevespi and could qualify for [place] patient [place] program. Sending appeal for Bevespi [facility] assistance program.   [facility]  [id number] [place] [phone] #[id number] - [id number], [place] [phone] Mail Service ([facility] [facility] [phone] ([facility] ) - [place]-[phone] Up:  Patient agrees to Care Plan and Follow-up.  Plan:  pharmacist follow up in 1 to 2 months.   Chaos Carlile EckCherre RobinsClinical Pharmacist [place] [phone]

## 2021-12-05 NOTE — Patient Instructions (Signed)
William Whitaker It was a pleasure speaking with you  Below is a summary of your health goals and care plan  Patient Goals/Self-Care Activities take medications as prescribed focus on medication adherence by pill count collaborate with provider on medication access solutions Our office will send appeal for patient assistance program for Bevespi.  Contact 1-800- [place] [phone]) if needed    If you have any questions or concerns, please feel free to contact me either at the phone number below or with a MyChart message.   Keep up the good work!  Cherre Robins, PharmD Clinical Pharmacist [place] [phone] (direct line)  [phone] (main office number)   The patient verbalized understanding of instructions, educational materials, and care plan provided today and DECLINED offer to receive copy of patient instructions, educational materials, and care plan.

## 2021-12-08 ENCOUNTER — Encounter: Payer: Self-pay | Admitting: Internal Medicine

## 2021-12-08 ENCOUNTER — Telehealth (INDEPENDENT_AMBULATORY_CARE_PROVIDER_SITE_OTHER): Payer: Medicare HMO | Admitting: Internal Medicine

## 2021-12-08 VITALS — Ht 73.0 in | Wt 214.0 lb

## 2021-12-08 DIAGNOSIS — F419 Anxiety disorder, unspecified: Secondary | ICD-10-CM

## 2021-12-08 DIAGNOSIS — Z720 Tobacco use: Secondary | ICD-10-CM | POA: Diagnosis not present

## 2021-12-08 DIAGNOSIS — R69 Illness, unspecified: Secondary | ICD-10-CM | POA: Diagnosis not present

## 2021-12-08 MED ORDER — BUPROPION HCL 100 MG PO TABS: 100.0000 mg | ORAL_TABLET | Freq: Two times a day (BID) | ORAL | 2 refills | Status: DC

## 2021-12-08 NOTE — Assessment & Plan Note (Signed)
Tobacco abuse In need of incisional hernia repair, the surgeon will proceed if he  stops tobacco, he did  a month ago, now has a number of symptoms: Anxiety, cravings, agitation, insomnia. Requests Wellbutrin alprazolam. I suggested SSRIs instead but he declines, previously Lexapro did not work for him. I advised him that I will not prescribe alprazolam given history of EtOH abuse.  This is an addictive medication. Plan: Wellbutrin 100 mg 1 tablet daily for few days then twice daily. Encouraged to use hydroxyzine as needed. Advised to do not go back to tobacco after the surgery as the healing process will be affected. EtOH: Reports he is not drinking. Encouraged to have physical exam at his earliest convenience

## 2021-12-08 NOTE — Progress Notes (Signed)
Subjective:    Patient ID: William Whitaker, male    DOB: Jul 11, 1960, 61 y.o.   MRN: [id number]  DOS:  12/08/2021 Type of visit - description: Virtual Visit via Video Note  I connected with the above patient  by a video enabled telemedicine application and verified that I am speaking with the correct person using two identifiers.   Location of patient: home  Location of provider: office  Persons participating in the virtual visit: patient, provider   I discussed the limitations of evaluation and management by telemedicine and the availability of in person appointments. The patient expressed understanding and agreed to proceed.  Acute The patient needs ventral hernia repair.  In preparation for the surgery he stopped tobacco a month ago. Having anxiety, cravings, feeling agitated. Request Wellbutrin and Xanax (as recommended by his surgeon).  Review of Systems See above   Past Medical History:  Diagnosis Date   Anxiety    Bronchitis    GERD (gastroesophageal reflux disease)    Insomnia     Past Surgical History:  Procedure Laterality Date   APPENDECTOMY  01/12/2021   open   BIOPSY  01/27/2019   Procedure: BIOPSY;  Surgeon: Irving Copas., MD;  Location: [facility];  Service: Gastroenterology;;   COLONOSCOPY WITH PROPOFOL N/A 01/27/2019   Procedure: COLONOSCOPY WITH PROPOFOL;  Surgeon: Irving Copas., MD;  Location: [facility];  Service: Gastroenterology;  Laterality: N/A;   ESOPHAGOGASTRODUODENOSCOPY (EGD) WITH PROPOFOL N/A 01/27/2019   Procedure: ESOPHAGOGASTRODUODENOSCOPY (EGD) WITH PROPOFOL;  Surgeon: Rush Landmark Telford Nab., MD;  Location: [facility];  Service: Gastroenterology;  Laterality: N/A;   HERNIA REPAIR Right remotely as a baby   POLYPECTOMY  01/27/2019   Procedure: POLYPECTOMY;  Surgeon: Irving Copas., MD;  Location: [facility];  Service: Gastroenterology;;    Current Outpatient Medications  Medication Instructions    albuterol (VENTOLIN HFA) 108 (90 Base) MCG/ACT inhaler TAKE 2 PUFFS BY MOUTH EVERY 6 HOURS AS NEEDED FOR WHEEZE OR SHORTNESS OF BREATH   chlorhexidine (PERIDEX) 0.12 % solution 2 times daily   Cholecalciferol (VITAMIN D3) 50 MCG (2000 UT) TABS 1 tablet, Oral, Daily   ferrous sulfate 325 mg, Oral, Daily with breakfast   furosemide (LASIX) 20 MG tablet TAKE 1 TABLET BY MOUTH EVERY DAY   Glycopyrrolate-Formoterol (BEVESPI AEROSPHERE) 9-4.8 MCG/ACT AERO 2 puffs, Inhalation, Daily   HYDROcodone-acetaminophen (NORCO/VICODIN) 5-325 MG tablet 1 tablet, Oral, 3 times daily PRN   hydrOXYzine (ATARAX) 10 mg, Oral, 3 times daily PRN   ibuprofen (ADVIL) 800 mg, Every 8 hours PRN   ipratropium (ATROVENT) 0.02 % nebulizer solution USE 1 VIAL VIA NEBULIZER 4 TIMES DAILY   Magnesium 250 MG TABS 1 tablet, Oral, Daily   Multiple Vitamins-Minerals (ONE-A-DAY MENS VITACRAVES) CHEW 1 tablet, Oral, Daily   nicotine (NICODERM CQ - DOSED IN MG/24 HOURS) 21 mg, Daily   omeprazole (PRILOSEC) 20 MG capsule TAKE 1 CAPSULE BY MOUTH EVERY DAY   potassium chloride SA (KLOR-CON M) 20 MEQ tablet TAKE 1 TABLET BY MOUTH DAILY   propranolol (INDERAL) 20 MG tablet TAKE 1 TABLET BY MOUTH  TWICE DAILY   spironolactone (ALDACTONE) 50 MG tablet TAKE 1 TABLET BY MOUTH  DAILY   topiramate (TOPAMAX) 25 mg, Oral, Daily   vitamin C (ASCORBIC ACID) 250 mg, Oral, Daily   Vitamin Mixture (VITAMIN E COMPLETE PO) Oral       Objective:   Physical Exam Ht '6\' 1"'$  (1.854 m)   Wt 214 lb (97.1 kg)  BMI 28.23 kg/m  This is a virtual video visit, alert oriented x3, in no distress, she seems to be at baseline    Assessment     ASSESSMENT (referred by [name])  COPD Anxiety, insomnia GI: 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 Hep C treated and quit alcohol. Tobacco abuse Anemia, severe. Perforated appendix 12-2020   PLAN Tobacco abuse In need of incisional hernia  repair, the surgeon will proceed if he  stops tobacco, he did  a month ago, now has a number of symptoms: Anxiety, cravings, agitation, insomnia. Requests Wellbutrin alprazolam. I suggested SSRIs instead but he declines, previously Lexapro did not work for him. I advised him that I will not prescribe alprazolam given history of EtOH abuse.  This is an addictive medication. Plan: Wellbutrin 100 mg 1 tablet daily for few days then twice daily. Encouraged to use hydroxyzine as needed. Advised to do not go back to tobacco after the surgery as the healing process will be affected. EtOH: Reports he is not drinking. Encouraged to have physical exam at his earliest convenience   I discussed the assessment and treatment plan with the patient. The patient was provided an opportunity to ask questions and all were answered. The patient agreed with the plan and demonstrated an understanding of the instructions.   The patient was advised to call back or seek an in-person evaluation if the symptoms worsen or if the condition fails to improve as anticipated.

## 2021-12-29 DIAGNOSIS — I10 Essential (primary) hypertension: Secondary | ICD-10-CM | POA: Diagnosis not present

## 2021-12-29 DIAGNOSIS — Z720 Tobacco use: Secondary | ICD-10-CM | POA: Diagnosis not present

## 2021-12-29 DIAGNOSIS — B182 Chronic viral hepatitis C: Secondary | ICD-10-CM | POA: Insufficient documentation

## 2021-12-29 DIAGNOSIS — R188 Other ascites: Secondary | ICD-10-CM | POA: Insufficient documentation

## 2021-12-29 DIAGNOSIS — J449 Chronic obstructive pulmonary disease, unspecified: Secondary | ICD-10-CM | POA: Diagnosis not present

## 2021-12-29 DIAGNOSIS — K432 Incisional hernia without obstruction or gangrene: Secondary | ICD-10-CM | POA: Diagnosis not present

## 2021-12-29 DIAGNOSIS — I8500 Esophageal varices without bleeding: Secondary | ICD-10-CM | POA: Insufficient documentation

## 2021-12-29 DIAGNOSIS — R69 Illness, unspecified: Secondary | ICD-10-CM | POA: Diagnosis not present

## 2021-12-30 ENCOUNTER — Other Ambulatory Visit: Payer: Self-pay | Admitting: Internal Medicine

## 2021-12-30 DIAGNOSIS — F32A Depression, unspecified: Secondary | ICD-10-CM

## 2021-12-30 DIAGNOSIS — F17210 Nicotine dependence, cigarettes, uncomplicated: Secondary | ICD-10-CM

## 2021-12-30 DIAGNOSIS — D649 Anemia, unspecified: Secondary | ICD-10-CM

## 2021-12-30 DIAGNOSIS — J449 Chronic obstructive pulmonary disease, unspecified: Secondary | ICD-10-CM | POA: Diagnosis not present

## 2021-12-30 DIAGNOSIS — R69 Illness, unspecified: Secondary | ICD-10-CM | POA: Diagnosis not present

## 2022-01-02 DIAGNOSIS — J449 Chronic obstructive pulmonary disease, unspecified: Secondary | ICD-10-CM | POA: Diagnosis not present

## 2022-01-02 DIAGNOSIS — K432 Incisional hernia without obstruction or gangrene: Secondary | ICD-10-CM | POA: Diagnosis not present

## 2022-01-02 DIAGNOSIS — K660 Peritoneal adhesions (postprocedural) (postinfection): Secondary | ICD-10-CM | POA: Diagnosis not present

## 2022-01-02 DIAGNOSIS — K430 Incisional hernia with obstruction, without gangrene: Secondary | ICD-10-CM | POA: Diagnosis not present

## 2022-01-02 DIAGNOSIS — L905 Scar conditions and fibrosis of skin: Secondary | ICD-10-CM | POA: Diagnosis not present

## 2022-01-02 DIAGNOSIS — G8918 Other acute postprocedural pain: Secondary | ICD-10-CM | POA: Diagnosis not present

## 2022-01-02 DIAGNOSIS — Z01818 Encounter for other preprocedural examination: Secondary | ICD-10-CM | POA: Diagnosis not present

## 2022-01-02 HISTORY — PX: INCISIONAL HERNIA REPAIR: SHX193

## 2022-01-03 DIAGNOSIS — K660 Peritoneal adhesions (postprocedural) (postinfection): Secondary | ICD-10-CM | POA: Diagnosis not present

## 2022-01-03 DIAGNOSIS — K430 Incisional hernia with obstruction, without gangrene: Secondary | ICD-10-CM | POA: Diagnosis not present

## 2022-01-03 DIAGNOSIS — J449 Chronic obstructive pulmonary disease, unspecified: Secondary | ICD-10-CM | POA: Diagnosis not present

## 2022-03-02 ENCOUNTER — Other Ambulatory Visit: Payer: Self-pay | Admitting: Internal Medicine

## 2022-03-03 ENCOUNTER — Telehealth: Payer: Medicare HMO

## 2022-03-16 ENCOUNTER — Other Ambulatory Visit: Payer: Self-pay | Admitting: Internal Medicine

## 2022-03-16 ENCOUNTER — Telehealth: Payer: Self-pay | Admitting: Internal Medicine

## 2022-03-16 NOTE — Telephone Encounter (Signed)
Pt is overdue for cpx-which must be in person.

## 2022-03-16 NOTE — Telephone Encounter (Signed)
Pt called to get a refill on the following medication. Pt believes he is due for a visit in order to get these refilled. Pt was scheduled for 9.22.23 for a VV and he is requesting a temporary supply of the medication to get him to this appt so he does not lapse in taking it:  Medication:   furosemide (LASIX) 20 MG tablet [[id number]]   Has the patient contacted their pharmacy? No. (If no, request that the patient contact the pharmacy for the refill.) (If yes, when and what did the pharmacy advise?)  Preferred Pharmacy (with phone number or street name):   [id number] [place] [address] [place] [place] [address] Phone:  [phone] Fax:  [phone]  Agent: Please be advised that RX refills may take up to 3 business days. We ask that you follow-up with your pharmacy.

## 2022-03-17 NOTE — Telephone Encounter (Signed)
Pt called back and information was relayed from [facility]. Pt stated that he would take a look at his calendar and figure out when he could come to get a CPE done and call us back.

## 2022-03-17 NOTE — Telephone Encounter (Signed)
Noted! Thank you

## 2022-03-21 ENCOUNTER — Ambulatory Visit: Payer: Medicare HMO | Admitting: Pharmacist

## 2022-03-21 DIAGNOSIS — Z720 Tobacco use: Secondary | ICD-10-CM

## 2022-03-21 DIAGNOSIS — J449 Chronic obstructive pulmonary disease, unspecified: Secondary | ICD-10-CM

## 2022-03-24 ENCOUNTER — Encounter: Payer: Self-pay | Admitting: Pharmacist

## 2022-03-24 ENCOUNTER — Telehealth (INDEPENDENT_AMBULATORY_CARE_PROVIDER_SITE_OTHER): Payer: Medicare HMO | Admitting: Internal Medicine

## 2022-03-24 ENCOUNTER — Encounter: Payer: Self-pay | Admitting: Internal Medicine

## 2022-03-24 VITALS — Ht 73.0 in | Wt 216.0 lb

## 2022-03-24 DIAGNOSIS — K7460 Unspecified cirrhosis of liver: Secondary | ICD-10-CM | POA: Diagnosis not present

## 2022-03-24 MED ORDER — PROPRANOLOL HCL 20 MG PO TABS: 20.0000 mg | ORAL_TABLET | Freq: Two times a day (BID) | ORAL | 0 refills | Status: DC

## 2022-03-24 MED ORDER — TOPIRAMATE 25 MG PO TABS: 25.0000 mg | ORAL_TABLET | Freq: Every day | ORAL | 0 refills | Status: DC

## 2022-03-24 MED ORDER — FUROSEMIDE 20 MG PO TABS: 20.0000 mg | ORAL_TABLET | Freq: Every day | ORAL | 0 refills | Status: DC

## 2022-03-24 NOTE — Progress Notes (Unsigned)
Subjective:    Patient ID: William Whitaker, male    DOB: 1961/04/13, 61 y.o.   MRN: [id number]  DOS:  03/24/2022 Type of visit - description: Virtual Visit via Video Note  I connected with the above patient  by a video enabled telemedicine application and verified that I am speaking with the correct person using two identifiers.  Location of patient: home  Location of provider: office  Persons participating in the virtual visit: patient, provider   I discussed the limitations of evaluation and management by telemedicine and the availability of in person appointments. The patient expressed understanding and agreed to proceed.  Acute. The patient needs several medications refilled Last seen in person on 08-2021.  He had a open repair of recurrent incarcerated incisional hernia on 01/02/2022. He is still recuperating from that procedure. See comments under my nurses note, he did not like to come to the office at all. States he is "tired" of seeing doctors and has no transportation.  Fortunately he feels well. Denies chest pain or difficulty breathing No nausea vomiting or diarrhea. Platelets are low but he denies any bleeding or rash.   Review of Systems See above   Past Medical History:  Diagnosis Date   Anxiety    Appendicitis    Bronchitis    GERD (gastroesophageal reflux disease)    Insomnia    Recurrent incisional hernia     Past Surgical History:  Procedure Laterality Date   APPENDECTOMY  01/12/2021   open   BIOPSY  01/27/2019   Procedure: BIOPSY;  Surgeon: Irving Copas., MD;  [facility];  Service: Gastroenterology;;   COLONOSCOPY WITH PROPOFOL N/A 01/27/2019   Procedure: COLONOSCOPY WITH PROPOFOL;  Surgeon: Irving Copas., MD;  Location: [facility];  Service: Gastroenterology;  Laterality: N/A;   ESOPHAGOGASTRODUODENOSCOPY (EGD) WITH PROPOFOL N/A 01/27/2019   Procedure: ESOPHAGOGASTRODUODENOSCOPY (EGD) WITH PROPOFOL;  Surgeon:  Rush Landmark Telford Nab., MD;  Location: [facility];  Service: Gastroenterology;  Laterality: N/A;   HERNIA REPAIR Right remotely as a baby   [place]  01/02/2022   open   POLYPECTOMY  01/27/2019   Procedure: POLYPECTOMY;  Surgeon: Mansouraty, Telford Nab., MD;  Location: [facility];  Service: Gastroenterology;;    Current Outpatient Medications  Medication Instructions   albuterol (VENTOLIN HFA) 108 (90 Base) MCG/ACT inhaler TAKE 2 PUFFS BY MOUTH EVERY 6 HOURS AS NEEDED FOR WHEEZE OR SHORTNESS OF BREATH   Cholecalciferol (VITAMIN D3) 50 MCG (2000 UT) TABS 1 tablet, Oral, Daily   furosemide (LASIX) 20 MG tablet TAKE 1 TABLET BY MOUTH EVERY DAY   Glycopyrrolate-Formoterol (BEVESPI AEROSPHERE) 9-4.8 MCG/ACT AERO 2 puffs, Inhalation, Daily   ipratropium (ATROVENT) 0.02 % nebulizer solution USE 1 VIAL VIA NEBULIZER 4 TIMES DAILY   Iron-Vitamins (GERITOL) LIQD 1 Dose, Oral, Daily   Magnesium 250 MG TABS 1 tablet, Oral, Daily   Multiple Vitamins-Minerals (ONE-A-DAY MENS VITACRAVES PO) 1 tablet, Oral, Daily   omeprazole (PRILOSEC) 20 MG capsule TAKE 1 CAPSULE BY MOUTH EVERY DAY   potassium chloride SA (KLOR-CON M) 20 MEQ tablet TAKE 1 TABLET BY MOUTH DAILY   propranolol (INDERAL) 20 MG tablet TAKE 1 TABLET BY MOUTH  TWICE DAILY   spironolactone (ALDACTONE) 50 MG tablet TAKE 1 TABLET BY MOUTH  DAILY   topiramate (TOPAMAX) 25 mg, Oral, Daily   vitamin C (ASCORBIC ACID) 250 mg, Oral, Daily   Vitamin Mixture (VITAMIN E COMPLETE PO) 1 capsule, Oral, Daily  Objective:   Physical Exam Ht '6\' 1"'$  (1.854 m)   Wt 216 lb (98 kg)   BMI 28.50 kg/m  No vital signs available today.  On chart review his blood pressure was normal 01/17/2022 when he went to see the surgeon.    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 Hep C treated and  quit alcohol. Tobacco abuse Anemia, severe. Perforated appendix 12-2020   PLAN Virtual visit  Liver cirrhosis: Refill furosemide, propranolol. BMP and CBC 01/02/2022 okay except for low platelets. EtOH: Reports he is drinking very little, only when he watch football with his friends. Tobacco: Reports he is not smoking Topamax is refilled. Vaccine advice: Recommend flu and COVID vaccines. He needs to be seen in the office, he agreed to make an appointment within the next 3 months.     I discussed the assessment and treatment plan with the patient. The patient was provided an opportunity to ask questions and all were answered. The patient agreed with the plan and demonstrated an understanding of the instructions.   The patient was advised to call back or seek an in-person evaluation if the symptoms worsen or if the condition fails to improve as anticipated.

## 2022-03-24 NOTE — Progress Notes (Signed)
Pharmacy Note  03/24/2022 Name: William Whitaker MRN: [id number] DOB: 03-15-1961  Subjective: William Whitaker is a 61 y.o. year old male who is a primary care patient of Colon Branch, MD. Clinical Pharmacist Practitioner referral was placed to assist with medication and COPD / smoking cessation management.    Engaged with patient by telephone for follow up visit today.  Patient reports he is having difficulty getting prescriptions refills. He is due to see PCP and furosemide refill was denied. He has appointment with PCP 03/24/2022. He is out of furosemide. Patient denies edema or shortness of breath.He is also taking spironolactone daily and has about 1 week of spironolactone. He also has about 1 week of propranolol and topiramate left.  Patient to discuss further with PCP 03/24/2022. Patient reports he has not smoked since his surgery but he does use a nicotine pouch 2 to 4 times per day. He was taking bupropion '100mg'$  twice a day but is not currently taking. He reports breathing has improved since stopping cigarette use and with starting Bevespi. He has been [place]evespi form [place]. Approved 12/15/2021 after our appeal and is good thru 07/02/2022.    Recent Office Visits:   12/08/2021 - Int Med (Dr Larose Kells) Video Visit for smoking cessation. Prescribed wellbutrin '100mg'$  twice a day  01/17/2022 - Surgical Specialist Alroy Dust, [facility]) Post Op follow up. Recommend daily fiber supplementation. Continue adequate hydration and ambulation. Stool softener recommended as needed.  01/02/2022 to 01/03/2022 - Hospitalization for planned procedure of incisional hernia repair via component separation Medication started at diacharge: oxycodone/APAP 5/'325mg'$  - 1 tablet every 4 hours as needed for severe pain.  Medications stopped at discharge: hydrocodone/APAP 5-'325mg'$   12/29/2021 - Surgical Consult (Dr Dema Severin - [facility]) recurrent incisional hernia. Given the size  of the hernia and the thin skin over the hernia, he will undergo an open component separation. He has stopped smoking although is using nicorette gum which I believe so we will proceed with surgery without a nicotine test.   SDOH (Social Determinants of Health) assessments and interventions performed:  SDOH Interventions    Flowsheet Row Chronic Care Management from 11/04/2021 in [facility] at [place] Management from 07/29/2021 in [place] at [place] from 03/02/2021 in [place] at [place] Management from 12/22/2020 in [place] at [place] Visit from 09/29/2020 in [place] at [place] Interventions       Depression Interventions/Treatment  -- -- -- -- Currently on Treatment, Counseling  Financial Strain Interventions Other (Comment)  [assisting with [place] for [place] for Bevespi] Other (Comment)  [patient was denied BI Cares assistance in 2022 because he has insurance coverage just cannot afford deductible and copay. Checked resources for assistance with copay and deductible and no current funds open for COPD.] -- Other (Comment)  [Applying for assistace thru [place] for Spiriva Inhaler] --  Physical Activity Interventions -- -- Intervention Not Indicated  [pt plans to get back to walking once he recovers from surgery] -- --        Objective: Review of patient status, including review of consultants reports, laboratory and other test data, was performed as part of comprehensive evaluation and provision of chronic care management services.   Lab Results  Component Value Date  CREATININE 0.84 08/12/2021   CREATININE 0.71 01/31/2021   CREATININE 0.78 09/29/2020    Lab Results  Component Value Date   HGBA1C 5.3 09/29/2020    BP Readings from Last 3 Encounters:   08/12/21 140/80  01/31/21 124/80  09/29/20 (!) 144/70     No Known Allergies  Medications Reviewed Today     Reviewed by Cherre Robins, RPH-CPP (Pharmacist) on 03/21/22 at 1235  Med List Status: <None>   Medication Order Taking? Sig Documenting Provider Last Dose Status Informant  albuterol (VENTOLIN HFA) 108 (90 Base) MCG/ACT inhaler [id number] Yes TAKE 2 PUFFS BY MOUTH EVERY 6 HOURS AS NEEDED FOR WHEEZE OR SHORTNESS OF BREATH Copland, Gay Filler, MD Taking Active   Cholecalciferol (VITAMIN D3) 50 MCG (2000 UT) TABS [id number] Yes Take 1 tablet by mouth daily. [provider] Taking Active   furosemide (LASIX) 20 MG tablet [id number] No TAKE 1 TABLET BY MOUTH EVERY DAY  Patient not taking: Reported on 03/21/2022   Colon Branch, MD Not Taking Active   Glycopyrrolate-Formoterol (BEVESPI AEROSPHERE) 9-4.8 MCG/ACT Hollie Salk [id number] Yes Inhale 2 puffs into the lungs daily. Colon Branch, MD Taking Active            Med Note Antony Contras, [place] B   Thu Aug 04, 2021  2:05 PM) Will start in place of Spiriva is approved by patient assistance program   ipratropium (ATROVENT) 0.02 % nebulizer solution [id number] Yes USE 1 VIAL VIA NEBULIZER 4 TIMES DAILY Colon Branch, MD Taking Active   Iron-Vitamins [place]) [place] [id number] Yes Take 1 Dose by mouth daily. [provider] Taking Active   Magnesium 250 MG TABS [id number] Yes Take 1 tablet by mouth daily. [provider] Taking Active   Multiple Vitamins-Minerals (ONE-A-DAY MENS VITACRAVES PO) [id number] Yes Chew 1 tablet by mouth daily. [provider] Taking Active Self  omeprazole (PRILOSEC) 20 MG capsule [id number] Yes TAKE 1 CAPSULE BY MOUTH EVERY DAY Dutch Quint B, FNP Taking Active   potassium chloride SA (KLOR-CON M) 20 MEQ tablet [id number] Yes TAKE 1 TABLET BY MOUTH DAILY  Patient taking differently: Take 0.5 tablets by mouth daily.   Colon Branch, MD Taking Active   propranolol (INDERAL) 20 MG tablet [id number] Yes TAKE 1  TABLET BY MOUTH  TWICE DAILY Colon Branch, MD Taking Active   spironolactone (ALDACTONE) 50 MG tablet [id number] Yes TAKE 1 TABLET BY MOUTH  DAILY Colon Branch, MD Taking Active   topiramate (TOPAMAX) 25 MG tablet [id number] Yes TAKE 1 TABLET (25 MG TOTAL) BY MOUTH DAILY. Colon Branch, MD Taking Active   vitamin C (ASCORBIC ACID) 250 MG tablet [id number] Yes Take 250 mg by mouth daily. [provider] Taking Active Self  Vitamin Mixture (VITAMIN E COMPLETE PO) [id number] Yes Take 1 capsule by mouth daily. [provider] Taking Active             Patient Active Problem List   Diagnosis Date Noted   Abdominal ascites 12/29/2021   Chronic hepatitis C virus infection ([place]) 12/29/2021   Esophageal varices ([place]) 12/29/2021   Annual physical exam 08/12/2021   Insomnia 09/29/2020   COPD (chronic obstructive pulmonary disease) ([place]) 04/09/2019   PCP NOTES >>>>>>>>>>>>>>>>>> 02/05/2019   Chronic hepatitis ([place]) 02/05/2019   EtOH dependence ([place]) 01/25/2019   Tobacco dependence 50/56/9794   Alcoholic cirrhosis of liver with ascites ([place]) 01/25/2019   Hyponatremia 01/25/2019   Iron deficiency anemia    Symptomatic anemia 01/24/2019  Anxiety 08/20/2007   REACTIVE AIRWAY DISEASE 08/20/2007   G E R D 08/20/2007     Medication Assistance:   Bevespi obtained through [place] and [place] medication assistance program.  Enrollment ends 07/02/2022   Assessment:   Medication management COPD / smoking cessation  Plan: Explained to patient the regular follow up with PCP is needed. He will see PCP 03/24/2022 and discuss needed refills.  Continue Bevespi inhaler - will plan to reapply for medication assistance program for 2024  Commended on continuing  to not smoke  Follow Up:  Telephone follow up appointment with care management team member scheduled for:  2 months   Cherre Robins, PharmD Clinical Pharmacist [place] [phone]

## 2022-03-25 NOTE — Assessment & Plan Note (Signed)
Virtual visit  Liver cirrhosis: Refill furosemide, propranolol. BMP and CBC 01/02/2022 okay except for low platelets. EtOH: Reports he is drinking very little, only when he watch football with his friends. Tobacco: Reports he is not smoking Topamax is refilled. Vaccine advice: Recommend flu and COVID vaccines. He needs to be seen in the office, he agreed to make an appointment within the next 3 months.

## 2022-04-03 ENCOUNTER — Ambulatory Visit (INDEPENDENT_AMBULATORY_CARE_PROVIDER_SITE_OTHER): Payer: Medicare HMO

## 2022-04-03 VITALS — Ht 73.0 in | Wt 216.0 lb

## 2022-04-03 DIAGNOSIS — Z0000 Encounter for general adult medical examination without abnormal findings: Secondary | ICD-10-CM | POA: Diagnosis not present

## 2022-04-03 NOTE — Patient Instructions (Signed)
William Whitaker , Thank you for taking time to complete your Medicare Wellness Visit. I appreciate your ongoing commitment to your health goals. Please review the following plan we discussed and let me know if I can assist you in the future.   Screening recommendations/referrals: Colonoscopy: Completed 01/27/2019-Due 01/26/2029 Recommended yearly ophthalmology/optometry visit for glaucoma screening and checkup Recommended yearly dental visit for hygiene and checkup  Vaccinations: Influenza vaccine: Due-May obtain vaccine at our office or your local pharmacy. Pneumococcal vaccine: Due at age 53 Tdap vaccine: Due-May obtain vaccine at your local pharmacy. Shingles vaccine: Not indicated due to never had [place] Pix   Covid-19: Booster available at your local pharmacy  Advanced directives: May pick up information at your next visit  Conditions/risks identified: See problem list  Next appointment: Follow up in one year for your annual wellness visit   Preventive Care 40-64 Years, Male Preventive care refers to lifestyle choices and visits with your health care provider that can promote health and wellness. What does preventive care include? A yearly physical exam. This is also called an annual well check. Dental exams once or twice a year. Routine eye exams. Ask your health care provider how often you should have your eyes checked. Personal lifestyle choices, including: Daily care of your teeth and gums. Regular physical activity. Eating a healthy diet. Avoiding tobacco and drug use. Limiting alcohol use. Practicing safe sex. Taking low-dose aspirin every day starting at age 29. What happens during an annual well check? The services and screenings done by your health care provider during your annual well check will depend on your age, overall health, lifestyle risk factors, and family history of disease. Counseling  Your health care provider may ask you questions about your: Alcohol  use. Tobacco use. Drug use. Emotional well-being. Home and relationship well-being. Sexual activity. Eating habits. Work and work Statistician. Screening  You may have the following tests or measurements: Height, weight, and BMI. Blood pressure. Lipid and cholesterol levels. These may be checked every 5 years, or more frequently if you are over 65 years old. Skin check. Lung cancer screening. You may have this screening every year starting at age 73 if you have a 30-pack-year history of smoking and currently smoke or have quit within the past 15 years. Fecal occult blood test (FOBT) of the stool. You may have this test every year starting at age 21. Flexible sigmoidoscopy or colonoscopy. You may have a sigmoidoscopy every 5 years or a colonoscopy every 10 years starting at age 82. Prostate cancer screening. Recommendations will vary depending on your family history and other risks. Hepatitis C blood test. Hepatitis B blood test. Sexually transmitted disease (STD) testing. Diabetes screening. This is done by checking your blood sugar (glucose) after you have not eaten for a while (fasting). You may have this done every 1-3 years. Discuss your test results, treatment options, and if necessary, the need for more tests with your health care provider. Vaccines  Your health care provider may recommend certain vaccines, such as: Influenza vaccine. This is recommended every year. Tetanus, diphtheria, and acellular pertussis (Tdap, Td) vaccine. You may need a Td booster every 10 years. Zoster vaccine. You may need this after age 72. Pneumococcal 13-valent conjugate (PCV13) vaccine. You may need this if you have certain conditions and have not been vaccinated. Pneumococcal polysaccharide (PPSV23) vaccine. You may need one or two doses if you smoke cigarettes or if you have certain conditions. Talk to your health care provider about which screenings  and vaccines you need and how often you need  them. This information is not intended to replace advice given to you by your health care provider. Make sure you discuss any questions you have with your health care provider. Document Released: 07/16/2015 Document Revised: 03/08/2016 Document Reviewed: 04/20/2015 Elsevier Interactive Patient Education  2017 [place] Prevention in the Home Falls can cause injuries. They can happen to people of all ages. There are many things you can do to make your home safe and to help prevent falls. What can I do on the outside of my home? Regularly fix the edges of walkways and driveways and fix any cracks. Remove anything that might make you trip as you walk through a door, such as a raised step or threshold. Trim any bushes or trees on the path to your home. Use bright outdoor lighting. Clear any walking paths of anything that might make someone trip, such as rocks or tools. Regularly check to see if handrails are loose or broken. Make sure that both sides of any steps have handrails. Any raised decks and porches should have guardrails on the edges. Have any leaves, snow, or ice cleared regularly. Use sand or salt on walking paths during winter. Clean up any spills in your garage right away. This includes oil or grease spills. What can I do in the bathroom? Use night lights. Install grab bars by the toilet and in the tub and shower. Do not use towel bars as grab bars. Use non-skid mats or decals in the tub or shower. If you need to sit down in the shower, use a plastic, non-slip stool. Keep the floor dry. Clean up any water that spills on the floor as soon as it happens. Remove soap buildup in the tub or shower regularly. Attach bath mats securely with double-sided non-slip rug tape. Do not have throw rugs and other things on the floor that can make you trip. What can I do in the bedroom? Use night lights. Make sure that you have a light by your bed that is easy to reach. Do not use  any sheets or blankets that are too big for your bed. They should not hang down onto the floor. Have a firm chair that has side arms. You can use this for support while you get dressed. Do not have throw rugs and other things on the floor that can make you trip. What can I do in the kitchen? Clean up any spills right away. Avoid walking on wet floors. Keep items that you use a lot in easy-to-reach places. If you need to reach something above you, use a strong step stool that has a grab bar. Keep electrical cords out of the way. Do not use floor polish or wax that makes floors slippery. If you must use wax, use non-skid floor wax. Do not have throw rugs and other things on the floor that can make you trip. What can I do with my stairs? Do not leave any items on the stairs. Make sure that there are handrails on both sides of the stairs and use them. Fix handrails that are broken or loose. Make sure that handrails are as long as the stairways. Check any carpeting to make sure that it is firmly attached to the stairs. Fix any carpet that is loose or worn. Avoid having throw rugs at the top or bottom of the stairs. If you do have throw rugs, attach them to the floor with carpet tape.  Make sure that you have a light switch at the top of the stairs and the bottom of the stairs. If you do not have them, ask someone to add them for you. What else can I do to help prevent falls? Wear shoes that: Do not have high heels. Have rubber bottoms. Are comfortable and fit you well. Are closed at the toe. Do not wear sandals. If you use a stepladder: Make sure that it is fully opened. Do not climb a closed stepladder. Make sure that both sides of the stepladder are locked into place. Ask someone to hold it for you, if possible. Clearly mark and make sure that you can see: Any grab bars or handrails. First and last steps. Where the edge of each step is. Use tools that help you move around (mobility aids)  if they are needed. These include: Canes. Walkers. Scooters. Crutches. Turn on the lights when you go into a dark area. Replace any light bulbs as soon as they burn out. Set up your furniture so you have a clear path. Avoid moving your furniture around. If any of your floors are uneven, fix them. If there are any pets around you, be aware of where they are. Review your medicines with your doctor. Some medicines can make you feel dizzy. This can increase your chance of falling. Ask your doctor what other things that you can do to help prevent falls. This information is not intended to replace advice given to you by your health care provider. Make sure you discuss any questions you have with your health care provider. Document Released: 04/15/2009 Document Revised: 11/25/2015 Document Reviewed: 07/24/2014 Elsevier Interactive Patient Education  2017 [employer].

## 2022-04-03 NOTE — Progress Notes (Addendum)
Subjective:   William Whitaker is a 61 y.o. male who presents for Medicare Annual/Subsequent preventive examination.  I connected with Jerald today by telephone and verified that I am speaking with the correct person using two identifiers. Location patient: home Location provider: work Persons participating in the virtual visit: patient, Marine scientist.    I discussed the limitations, risks, security and privacy concerns of performing an evaluation and management service by telephone and the availability of in person appointments. I also discussed with the patient that there may be a patient responsible charge related to this service. The patient expressed understanding and verbally consented to this telephonic visit.    Interactive audio and video telecommunications were attempted between this provider and patient, however failed, due to patient having technical difficulties OR patient did not have access to video capability.  We continued and completed visit with audio only.  Some vital signs may be absent or patient reported.   Time Spent with patient on telephone encounter: 20 minutes   Review of Systems     Cardiac Risk Factors include: advanced age (>32mn, >>16women);male gender;sedentary lifestyle     Objective:    Today's Vitals   04/03/22 1046  Weight: 216 lb (98 kg)  Height: '6\' 1"'$  (1.854 m)  PainSc: 7    Body mass index is 28.5 kg/m.     04/03/2022   10:51 AM 03/02/2021    1:05 PM 03/05/2020    1:20 PM 01/24/2019    3:19 PM  Advanced Directives  Does Patient Have a Medical Advance Directive? No No No No  Would patient like information on creating a medical advance directive?  No - Patient declined No - Patient declined     Current Medications (verified) Outpatient Encounter Medications as of 04/03/2022  Medication Sig   albuterol (VENTOLIN HFA) 108 (90 Base) MCG/ACT inhaler TAKE 2 PUFFS BY MOUTH EVERY 6 HOURS AS NEEDED FOR WHEEZE OR SHORTNESS OF BREATH   Cholecalciferol  (VITAMIN D3) 50 MCG (2000 UT) TABS Take 1 tablet by mouth daily.   furosemide (LASIX) 20 MG tablet Take 1 tablet (20 mg total) by mouth daily.   Glycopyrrolate-Formoterol (BEVESPI AEROSPHERE) 9-4.8 MCG/ACT AERO Inhale 2 puffs into the lungs daily.   ipratropium (ATROVENT) 0.02 % nebulizer solution USE 1 VIAL VIA NEBULIZER 4 TIMES DAILY   Iron-Vitamins (GERITOL) LIQD Take 1 Dose by mouth daily.   Magnesium 250 MG TABS Take 1 tablet by mouth daily.   Multiple Vitamins-Minerals (ONE-A-DAY MENS VITACRAVES PO) Chew 1 tablet by mouth daily.   omeprazole (PRILOSEC) 20 MG capsule TAKE 1 CAPSULE BY MOUTH EVERY DAY   potassium chloride SA (KLOR-CON M) 20 MEQ tablet TAKE 1 TABLET BY MOUTH DAILY (Patient taking differently: Take 0.5 tablets by mouth daily.)   propranolol (INDERAL) 20 MG tablet Take 1 tablet (20 mg total) by mouth 2 (two) times daily.   spironolactone (ALDACTONE) 50 MG tablet TAKE 1 TABLET BY MOUTH  DAILY   topiramate (TOPAMAX) 25 MG tablet Take 1 tablet (25 mg total) by mouth daily.   vitamin C (ASCORBIC ACID) 250 MG tablet Take 250 mg by mouth daily.   Vitamin Mixture (VITAMIN E COMPLETE PO) Take 1 capsule by mouth daily.   No facility-administered encounter medications on file as of 04/03/2022.    Allergies (verified) Patient has no known allergies.   History: Past Medical History:  Diagnosis Date   Anxiety    Appendicitis    Bronchitis    GERD (gastroesophageal reflux  disease)    Insomnia    Recurrent incisional hernia    Past Surgical History:  Procedure Laterality Date   APPENDECTOMY  01/12/2021   open   BIOPSY  01/27/2019   Procedure: BIOPSY;  Surgeon: Irving Copas., MD;  Location: [facility];  Service: Gastroenterology;;   COLONOSCOPY WITH PROPOFOL N/A 01/27/2019   Procedure: COLONOSCOPY WITH PROPOFOL;  Surgeon: Irving Copas., MD;  Location: [facility];  Service: Gastroenterology;  Laterality: N/A;   ESOPHAGOGASTRODUODENOSCOPY (EGD) WITH  PROPOFOL N/A 01/27/2019   Procedure: ESOPHAGOGASTRODUODENOSCOPY (EGD) WITH PROPOFOL;  Surgeon: Rush Landmark Telford Nab., MD;  Location: [facility];  Service: Gastroenterology;  Laterality: N/A;   HERNIA REPAIR Right remotely as a baby   [place]  01/02/2022   open   POLYPECTOMY  01/27/2019   Procedure: POLYPECTOMY;  Surgeon: Mansouraty, Telford Nab., MD;  Location: [facility];  Service: Gastroenterology;;   Family History  Problem Relation Age of Onset   Muscular dystrophy Mother    CAD Neg Hx    Diabetes Neg Hx    Cancer Neg Hx    Social History   Socioeconomic History   Marital status: Divorced    Spouse name: Not on file   Number of children: Not on file   Years of education: Not on file   Highest education level: Not on file  Occupational History   Occupation: disability d/t COPD  Tobacco Use   Smoking status: Former    Packs/day: 0.50    Years: 30.00    Total pack years: 15.00    Types: Cigarettes    Quit date: 10/24/2021    Years since quitting: 0.4   Smokeless tobacco: Never   Tobacco comments:    3 PPD, started to decrease to 1/2 ppd 2019, 2021 now smoking 1/4 ppd; 10/2021 quit smoking and is using nicotine patches and Zyn nicotine pouches.   Vaping Use   Vaping Use: Former  Substance and Sexual Activity   Alcohol use: Not Currently    Alcohol/week: 60.0 standard drinks of alcohol    Types: 60 Cans of beer per week    Comment: denies on 08/06/2019   Drug use: No   Sexual activity: Not on file  Other Topics Concern   Not on file  Social History Narrative   Not on file   Social Determinants of Health   Financial Resource Strain: High Risk (11/04/2021)   Overall Financial Resource Strain (CARDIA)    Difficulty of Paying Living Expenses: Hard  Food Insecurity: No Food Insecurity (04/03/2022)   Hunger Vital Sign    Worried About Running Out of Food in the Last Year: Never true    Ran Out of Food in the Last Year: Never true  Transportation  Needs: No Transportation Needs (04/03/2022)   PRAPARE - Hydrologist (Medical): No    Lack of Transportation (Non-Medical): No  Physical Activity: Inactive (03/02/2021)   Exercise Vital Sign    Days of Exercise per Week: 0 days    Minutes of Exercise per Session: 0 min  Stress: No Stress Concern Present (04/03/2022)   [place]    Feeling of Stress : Not at all  Social Connections: Moderately Isolated (04/03/2022)   Social Connection and Isolation Panel [NHANES]    Frequency of Communication with Friends and Family: More than three times a week    Frequency of Social Gatherings with Friends and Family: More than three times  a week    Attends Religious Services: 1 to 4 times per year    Active Member of Clubs or Organizations: No    Attends Archivist Meetings: Never    Marital Status: Divorced    Tobacco Counseling Counseling given: Not Answered Tobacco comments: 3 PPD, started to decrease to 1/2 ppd 2019, 2021 now smoking 1/4 ppd; 10/2021 quit smoking and is using nicotine patches and Zyn nicotine pouches.    Clinical Intake:  Pre-visit preparation completed: Yes  Pain : 0-10 Pain Score: 7  Pain Type: Acute pain Pain Onset: Yesterday (after yard work) Pain Frequency: Constant     BMI - recorded: 28.5 Nutritional Status: BMI 25 -29 Overweight Nutritional Risks: None Diabetes: No  How often do you need to have someone help you when you read instructions, pamphlets, or other written materials from your doctor or pharmacy?: 1 - Never  Diabetic?No  Interpreter Needed?: No  Information entered by :: Caroleen Hamman LPN   Activities of Daily Living    04/03/2022   10:57 AM  In your present state of health, do you have any difficulty performing the following activities:  Hearing? 0  Vision? 0  Difficulty concentrating or making decisions? 0  Walking or climbing  stairs? 0  Dressing or bathing? 0  Doing errands, shopping? 0  Preparing Food and eating ? N  Using the Toilet? N  In the past six months, have you accidently leaked urine? N  Do you have problems with loss of bowel control? N  Managing your Medications? N  Managing your Finances? N  Housekeeping or managing your Housekeeping? N    Patient Care Team: Colon Branch, MD as PCP - General (Internal Medicine) Cherre Robins, [place] (Pharmacist)  Indicate any recent Medical Services you may have received from other than [facility] providers in the past year (date may be approximate).     Assessment:   This is a routine wellness examination for [facility].  Hearing/Vision screen Hearing Screening - Comments:: Pt states he does have some hearing loss Vision Screening - Comments:: Last eye exam-several years ago  Dietary issues and exercise activities discussed: Current Exercise Habits: The patient does not participate in regular exercise at present   Goals Addressed             This Visit's Progress    Patient Stated   On track    Increase activity & eat healthier       Depression Screen    04/03/2022   10:57 AM 12/08/2021   10:03 AM 08/15/2021    4:34 PM 03/02/2021    1:10 PM 01/31/2021   11:30 AM 09/29/2020    2:54 PM 01/24/2019    3:13 PM  PHQ 2/9 Scores  PHQ - 2 Score 0 0 0 0 0 2 4  PHQ- 9 Score      7 16    Fall Risk    04/03/2022   10:56 AM 12/08/2021   10:03 AM 03/02/2021    1:08 PM 01/31/2021   11:30 AM 09/29/2020    2:21 PM  Fall Risk   Falls in the past year? 0 0 0 0 0  Number falls in past yr: 0 0 0 0 0  Injury with Fall? 0 0 0 0 0  Follow up Falls prevention discussed Falls evaluation completed Falls prevention discussed Falls evaluation completed Falls evaluation completed    [place]:  Any stairs in or around the  home? No  Home free of loose throw rugs in walkways, pet beds, electrical cords, etc? Yes  Adequate lighting in your  home to reduce risk of falls? Yes   ASSISTIVE DEVICES UTILIZED TO PREVENT FALLS:  Life alert? No  Use of a cane, walker or w/c? No  Grab bars in the bathroom? No  Shower chair or bench in shower? No  Elevated toilet seat or a handicapped toilet? No   TIMED UP AND GO:  Was the test performed? No . Phone visit   Cognitive Function:Patient refused MMSE/6CIT    04/03/2022   11:08 AM  MMSE - Mini Mental State Exam  Not completed: Refused        Immunizations Immunization History  Administered Date(s) Administered   H1N1 06/30/2008   Hep A / Hep B 06/30/2019   Hepatitis A, Adult 04/21/2019   Hepatitis B, PED/ADOLESCENT 03/11/2019   Influenza Inj Mdck Quad Pf 04/10/2019   Influenza,inj,Quad PF,6+ Mos 08/12/2021   Influenza,inj,Quad PF,6-35 Mos 08/12/2021   Janssen (J&J) SARS-COV-2 Vaccination 02/04/2020    TDAP status: Due, Education has been provided regarding the importance of this vaccine. Advised may receive this vaccine at local pharmacy or Health Dept. Aware to provide a copy of the vaccination record if obtained from local pharmacy or Health Dept. Verbalized acceptance and understanding.  Flu Vaccine status: Due, Education has been provided regarding the importance of this vaccine. Advised may receive this vaccine at local pharmacy or Health Dept. Aware to provide a copy of the vaccination record if obtained from local pharmacy or Health Dept. Verbalized acceptance and understanding.  Pneumococcal vaccine status: Due at age 89  Covid-19 vaccine status: Information provided on how to obtain vaccines.   Qualifies for Shingles Vaccine? Yes   Zostavax completed No   Shingrix Completed?: No.    Education has been provided regarding the importance of this vaccine. Patient has been advised to call insurance company to determine out of pocket expense if they have not yet received this vaccine. Advised may also receive vaccine at local pharmacy or Health Dept. Verbalized  acceptance and understanding.  Screening Tests Health Maintenance  Topic Date Due   TETANUS/TDAP  Never done   Zoster Vaccines- Shingrix (1 of 2) Never done   INFLUENZA VACCINE  10/01/2022 (Originally 01/31/2022)   COLONOSCOPY (Pts 45-53yr Insurance coverage will need to be confirmed)  01/26/2029   Hepatitis C Screening  Completed   HIV Screening  Completed   HPV VACCINES  Aged Out   COVID-19 Vaccine  Discontinued    Health Maintenance  Health Maintenance Due  Topic Date Due   TETANUS/TDAP  Never done   Zoster Vaccines- Shingrix (1 of 2) Never done    Colorectal cancer screening: Type of screening: Colonoscopy. Completed 01/27/2019. Repeat every 10 years  Lung Cancer Screening: (Low Dose CT Chest recommended if Age 62-80years, 30 pack-year currently smoking OR have quit w/in 15years.) does not qualify.     Additional Screening:  Hepatitis C Screening: Completed 12/29/2021  Vision Screening: Recommended annual ophthalmology exams for early detection of glaucoma and other disorders of the eye. Is the patient up to date with their annual eye exam?  No  Who is the provider or what is the name of the office in which the   Dental Screening: Recommended annual dental exams for proper oral hygiene  Community Resource Referral / Chronic Care Management: CRR required this visit?  No   CCM required this visit?  No  Plan:     I have personally reviewed and noted the following in the patient's chart:   Medical and social history Use of alcohol, tobacco or illicit drugs  Current medications and supplements including opioid prescriptions. Patient is not currently taking opioid prescriptions. Functional ability and status Nutritional status Physical activity Advanced directives List of other physicians Hospitalizations, surgeries, and ER visits in previous 12 months Vitals Screenings to include cognitive, depression, and falls Referrals and appointments  In addition,  I have reviewed and discussed with patient certain preventive protocols, quality metrics, and best practice recommendations. A written personalized care plan for preventive services as well as general preventive health recommendations were provided to patient.   Due to this being a telephonic visit, the after visit summary with patients personalized plan was offered to patient via mail or my-chart. Patient to pick up at office at next visit.   Marta Antu, LPN   [id number]  Nurse Health Advisor  Nurse Notes: None  I have reviewed and agree with Health Coaches documentation.  Kathlene November, MD

## 2022-04-13 ENCOUNTER — Other Ambulatory Visit: Payer: Self-pay | Admitting: Internal Medicine

## 2022-05-09 ENCOUNTER — Encounter: Payer: Self-pay | Admitting: Pharmacist

## 2022-05-09 ENCOUNTER — Telehealth (INDEPENDENT_AMBULATORY_CARE_PROVIDER_SITE_OTHER): Payer: Medicare HMO | Admitting: Pharmacist

## 2022-05-09 DIAGNOSIS — Z720 Tobacco use: Secondary | ICD-10-CM

## 2022-05-09 DIAGNOSIS — J449 Chronic obstructive pulmonary disease, unspecified: Secondary | ICD-10-CM

## 2022-05-09 NOTE — Progress Notes (Signed)
Pharmacy Note  05/09/2022 Name: William Whitaker MRN: [id number] DOB: 05/18/1961  Subjective: William Whitaker is a 61 y.o. year old male who is a primary care patient of Colon Branch, MD. Clinical Pharmacist Practitioner referral was placed to assist with medication and COPD / smoking cessation management.    Engaged with patient by telephone for follow up visit today.  Due to reapply for [place] program. Started instructing patient on reapplying on line but program stated he was ineligible due to inusurance situation. Encountered same situation last year when we applied. Had to send letter to [facility] and me with patient signature verifying that he did not have a Armed forces training and education officer.    Objective: Review of patient status, including review of consultants reports, laboratory and other test data, was performed as part of comprehensive evaluation and provision of chronic care management services.   Lab Results  Component Value Date   CREATININE 0.84 08/12/2021   CREATININE 0.71 01/31/2021   CREATININE 0.78 09/29/2020    Lab Results  Component Value Date   HGBA1C 5.3 09/29/2020    BP Readings from Last 3 Encounters:  08/12/21 140/80  01/31/21 124/80  09/29/20 (!) 144/70     No Known Allergies  Medications Reviewed Today     Reviewed by Marta Antu, LPN (Licensed Practical Nurse) on 04/03/22 at 60  Med List Status: <None>   Medication Order Taking? Sig Documenting Provider Last Dose Status Informant  albuterol (VENTOLIN HFA) 108 (90 Base) MCG/ACT inhaler [id number]  TAKE 2 PUFFS BY MOUTH EVERY 6 HOURS AS NEEDED FOR WHEEZE OR SHORTNESS OF BREATH Copland, Gay Filler, MD  Active   Cholecalciferol (VITAMIN D3) 50 MCG (2000 UT) TABS [id number]  Take 1 tablet by mouth daily. [provider]  Active   furosemide (LASIX) 20 MG tablet [id number]  Take 1 tablet (20 mg total) by mouth daily. Colon Branch, MD  Active   Glycopyrrolate-Formoterol (BEVESPI AEROSPHERE) 9-4.8 MCG/ACT  Hollie Salk [id number]  Inhale 2 puffs into the lungs daily. Colon Branch, MD  Active            Med Note [place], [place] B   Thu Aug 04, 2021  2:05 PM) Will start in place of [facility] is approved by patient assistance program   ipratropium (ATROVENT) 0.02 % nebulizer solution [id number]  USE 1 VIAL VIA NEBULIZER [place], MD  Active   Iron-Vitamins [place]) [place] [id number]  Take 1 Dose by mouth daily. [provider]  Active   Magnesium 250 MG TABS [id number]  Take 1 tablet by mouth daily. [provider]  Active   Multiple Vitamins-Minerals (ONE-A-DAY MENS VITACRAVES PO) [id number]  Chew 1 tablet by mouth daily. [provider]  Active Self  omeprazole (PRILOSEC) 20 MG capsule [id number]  TAKE 1 CAPSULE BY MOUTH EVERY DAY Dutch Quint B, FNP  Active   potassium chloride SA (KLOR-CON M) 20 MEQ tablet [id number]  TAKE 1 TABLET BY MOUTH DAILY  Patient taking differently: Take 0.5 tablets by mouth daily.   Colon Branch, MD  Active   propranolol (INDERAL) 20 MG tablet [id number]  Take 1 tablet (20 mg total) by mouth 2 (two) times daily. Colon Branch, MD  Active   spironolactone (ALDACTONE) 50 MG tablet [id number]  TAKE 1 TABLET BY MOUTH  DAILY Colon Branch, MD  Active   topiramate (TOPAMAX) 25 MG tablet [id number]  Take 1 tablet (25 mg total) by  mouth daily. Colon Branch, MD  Active   vitamin C (ASCORBIC ACID) 250 MG tablet [id number]  Take 250 mg by mouth daily. [provider]  Active Self  Vitamin Mixture (VITAMIN E COMPLETE PO) [id number]  Take 1 capsule by mouth daily. [provider]  Active             Patient Active Problem List   Diagnosis Date Noted   Abdominal ascites 12/29/2021   Chronic hepatitis C virus infection ([place]) 12/29/2021   Esophageal varices ([place]) 12/29/2021   Annual physical exam 08/12/2021   Insomnia 09/29/2020   COPD (chronic obstructive pulmonary disease) ([place]) 04/09/2019   PCP NOTES >>>>>>>>>>>>>>>>>> 02/05/2019   Chronic  hepatitis ([place]) 02/05/2019   EtOH dependence ([place]) 01/25/2019   Tobacco dependence 74/73/4037   Alcoholic cirrhosis of liver with ascites ([place]) 01/25/2019   Hyponatremia 01/25/2019   Iron deficiency anemia    Symptomatic anemia 01/24/2019   Anxiety 08/20/2007   REACTIVE AIRWAY DISEASE 08/20/2007   G E R D 08/20/2007     Medication Assistance:   Bevespi obtained through [place] and Me medication assistance program.  Enrollment ends 07/02/2022   Assessment:   Medication management COPD / smoking cessation  Plan: Started process for 2024 re enrollment for Bevespi. Will create letter for patient stating he has not additional insurance coverage, just [employer].   Continue Bevespi inhaler (patient has 3 inhalers on hand currently)  Continue to use nicotine replacement as needed to refrain from smoking.   Follow Up:  Telephone follow up appointment with care management team member scheduled for:  2 months   Cherre Robins, PharmD Clinical Pharmacist [place] [phone]

## 2022-05-19 ENCOUNTER — Telehealth: Payer: Self-pay

## 2022-05-19 NOTE — Telephone Encounter (Signed)
Mailed AZ&ME renewal application to patient home.   Please have office staff fax to [phone] ATTENTION Amorah [place]. Patient will be returning to office.   Treyson Axel L. CPhT Rx Patient Advocate 

## 2022-06-01 ENCOUNTER — Other Ambulatory Visit: Payer: Self-pay | Admitting: Family

## 2022-06-12 ENCOUNTER — Telehealth: Payer: Self-pay | Admitting: Pharmacist

## 2022-06-12 NOTE — Telephone Encounter (Signed)
Received request for updated prescription for Bevespi for patient assistance with AZ and Me program but also received fax indicating that shipment for Bevespi was sent to patient 06/08/2022. Called AZ and Me. They did ship Bevespi 06/08/2022 but it was his last refill.  Patient will need updated Rx for 2024.  His application was denied due to [facility] and Me noting that he has [employer]. 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 Last year we submitted letter attesting that he did not have commercial insurance and he was then approved. Will fax letter to [place] and Me.

## 2022-06-14 ENCOUNTER — Other Ambulatory Visit: Payer: Self-pay | Admitting: Internal Medicine

## 2022-06-19 ENCOUNTER — Other Ambulatory Visit: Payer: Self-pay | Admitting: Internal Medicine

## 2022-06-21 ENCOUNTER — Other Ambulatory Visit: Payer: Self-pay | Admitting: Internal Medicine

## 2022-07-04 ENCOUNTER — Other Ambulatory Visit: Payer: Self-pay | Admitting: Internal Medicine

## 2022-07-05 ENCOUNTER — Telehealth: Payer: Self-pay | Admitting: Pharmacist

## 2022-07-05 MED ORDER — BEVESPI AEROSPHERE 9-4.8 MCG/ACT IN AERO: 2.0000 | INHALATION_SPRAY | Freq: Every day | RESPIRATORY_TRACT | 4 refills | Status: DC

## 2022-07-05 NOTE — Telephone Encounter (Signed)
Received fax from [facility] and Me Program that patient was approved 07/03/2022 thru 07/03/2023 to received Bevespi.  Updated Rx needed and sent to [facility].  Patient notified.

## 2022-07-11 ENCOUNTER — Other Ambulatory Visit: Payer: Self-pay | Admitting: Internal Medicine

## 2022-07-13 ENCOUNTER — Other Ambulatory Visit: Payer: Self-pay | Admitting: Internal Medicine

## 2022-07-26 ENCOUNTER — Other Ambulatory Visit: Payer: Self-pay | Admitting: Internal Medicine

## 2022-07-27 ENCOUNTER — Other Ambulatory Visit: Payer: Self-pay | Admitting: Internal Medicine

## 2022-07-28 ENCOUNTER — Ambulatory Visit (INDEPENDENT_AMBULATORY_CARE_PROVIDER_SITE_OTHER): Payer: Medicare HMO | Admitting: Internal Medicine

## 2022-07-28 ENCOUNTER — Encounter: Payer: Self-pay | Admitting: Internal Medicine

## 2022-07-28 VITALS — BP 136/78 | HR 102 | Temp 98.8°F | Resp 18 | Ht 73.0 in | Wt 220.2 lb

## 2022-07-28 DIAGNOSIS — K7030 Alcoholic cirrhosis of liver without ascites: Secondary | ICD-10-CM

## 2022-07-28 DIAGNOSIS — K7031 Alcoholic cirrhosis of liver with ascites: Secondary | ICD-10-CM

## 2022-07-28 DIAGNOSIS — R69 Illness, unspecified: Secondary | ICD-10-CM | POA: Diagnosis not present

## 2022-07-28 DIAGNOSIS — R918 Other nonspecific abnormal finding of lung field: Secondary | ICD-10-CM | POA: Diagnosis not present

## 2022-07-28 MED ORDER — TOPIRAMATE 25 MG PO TABS: 25.0000 mg | ORAL_TABLET | Freq: Every day | ORAL | 0 refills | Status: DC

## 2022-07-28 MED ORDER — PROPRANOLOL HCL 20 MG PO TABS: 20.0000 mg | ORAL_TABLET | Freq: Two times a day (BID) | ORAL | 0 refills | Status: DC

## 2022-07-28 MED ORDER — FUROSEMIDE 20 MG PO TABS: 20.0000 mg | ORAL_TABLET | Freq: Every day | ORAL | 0 refills | Status: DC

## 2022-07-28 MED ORDER — OMEPRAZOLE 20 MG PO CPDR: DELAYED_RELEASE_CAPSULE | ORAL | 0 refills | Status: DC

## 2022-07-28 MED ORDER — SPIRONOLACTONE 50 MG PO TABS: 50.0000 mg | ORAL_TABLET | Freq: Every day | ORAL | 0 refills | Status: DC

## 2022-07-28 NOTE — Patient Instructions (Addendum)
Vaccines I recommend:  Tdap (tetanus) Shingrix (shingles) RSV vaccine Flu shot COVID  Worsening a refill for furosemide, propranolol, omeprazole.  Start taking your medications, your BP is slightly elevated.  We are arranging for a CAT scan of your chest for next week  Check the  blood pressure regularly BP GOAL is between 110/65 and  135/85. If it is consistently higher or lower, let me know   GO TO THE FRONT DESK, PLEASE SCHEDULE YOUR APPOINTMENTS Come back for physical exam in 3 to 4 weeks

## 2022-07-28 NOTE — Progress Notes (Unsigned)
Subjective:    Patient ID: William Whitaker, male    DOB: 22-Jul-1960, 62 y.o.   MRN: [id number]  DOS:  07/28/2022 Type of visit - description: Follow-up  Here for follow-up, needs some refills.  Reports he is doing well and has no major concerns.   Review of Systems See above   Past Medical History:  Diagnosis Date   Anxiety    Appendicitis    Bronchitis    GERD (gastroesophageal reflux disease)    Insomnia    Recurrent incisional hernia     Past Surgical History:  Procedure Laterality Date   APPENDECTOMY  01/12/2021   open   BIOPSY  01/27/2019   Procedure: BIOPSY;  Surgeon: Irving Copas., MD;  Location: [facility];  Service: Gastroenterology;;   COLONOSCOPY WITH PROPOFOL N/A 01/27/2019   Procedure: COLONOSCOPY WITH PROPOFOL;  Surgeon: Irving Copas., MD;  Location: [facility];  Service: Gastroenterology;  Laterality: N/A;   ESOPHAGOGASTRODUODENOSCOPY (EGD) WITH PROPOFOL N/A 01/27/2019   Procedure: ESOPHAGOGASTRODUODENOSCOPY (EGD) WITH PROPOFOL;  Surgeon: Rush Landmark Telford Nab., MD;  Location: [facility];  Service: Gastroenterology;  Laterality: N/A;   HERNIA REPAIR Right remotely as a baby   [place]  01/02/2022   open   POLYPECTOMY  01/27/2019   Procedure: POLYPECTOMY;  Surgeon: Mansouraty, Telford Nab., MD;  Location: [facility];  Service: Gastroenterology;;    Current Outpatient Medications  Medication Instructions   albuterol (VENTOLIN HFA) 108 (90 Base) MCG/ACT inhaler TAKE 2 PUFFS BY MOUTH EVERY 6 HOURS AS NEEDED FOR WHEEZE OR SHORTNESS OF BREATH   Cholecalciferol (VITAMIN D3) 50 MCG (2000 UT) TABS 1 tablet, Oral, Daily   furosemide (LASIX) 20 mg, Oral, Daily   Glycopyrrolate-Formoterol (BEVESPI AEROSPHERE) 9-4.8 MCG/ACT AERO 2 puffs, Inhalation, Daily   ipratropium (ATROVENT) 0.02 % nebulizer solution USE 1 VIAL VIA NEBULIZER 4 TIMES DAILY   Iron-Vitamins (GERITOL) LIQD 1 Dose, Oral, Daily   Magnesium 250 MG TABS 1  tablet, Oral, Daily   Multiple Vitamins-Minerals (ONE-A-DAY MENS VITACRAVES PO) 1 tablet, Oral, Daily   omeprazole (PRILOSEC) 20 MG capsule TAKE 1 CAPSULE BY MOUTH EVERY DAY   potassium chloride SA (KLOR-CON M) 20 MEQ tablet TAKE 1 TABLET BY MOUTH DAILY   propranolol (INDERAL) 20 mg, Oral, 2 times daily   spironolactone (ALDACTONE) 50 mg, Oral, Daily   topiramate (TOPAMAX) 25 mg, Oral, Daily   vitamin C (ASCORBIC ACID) 250 mg, Oral, Daily   Vitamin Mixture (VITAMIN E COMPLETE PO) 1 capsule, Oral, Daily       Objective:   Physical Exam BP 136/78   Pulse (!) 102   Temp 98.8 F (37.1 C) (Oral)   Resp 18   Ht '6\' 1"'$  (1.854 m)   Wt 220 lb 4 oz (99.9 kg)   SpO2 97%   BMI 29.06 kg/m  General:   Well developed, NAD, BMI noted. HEENT:  Normocephalic . Face symmetric, atraumatic Lungs:  CTA B Normal respiratory effort, no intercostal retractions, no accessory muscle use. Heart: RRR,  no murmur.  Lower extremities: no pretibial edema bilaterally  Skin: Not pale. Not jaundice Neurologic:  alert & oriented X3.  Speech normal, gait appropriate for age and unassisted Psych--  Cognition and judgment appear intact.  Cooperative with normal attention span and concentration.  Behavior appropriate. No anxious or depressed appearing.      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 Hep C treated and quit alcohol. Tobacco abuse Anemia, severe. Perforated appendix 12-2020   PLAN COPD: Good compliance with inhalers, reports chronic cough with some sputum production but denies hemoptysis Pulmonary nodule incidentally found on CT outside hospital 12-2020.   new right middle lobe nodule however best seen on image number 89 of series 3 which has a somewhat spiculated margin and measures approximately 8 mm. This is new from the prior exam from 2020.    Referral to pulmonary failed.  Will order CT chest without contrast for a follow-up Cirrhosis last seen by GI November 2021, they noted next colonoscopy was due 2023 for tubular adenomas. He was also supposed to be [facility] but he does not plan to go back there.  Fortunately denies nausea vomiting or blood in the stools.   Out of omeprazole, Lasix and propranolol today.  RFs sent. Labs on RTC EtOH: He states he is drinking socially only Tobacco: States he is not smoking, he uses a nicotine pouch as needed Preventive care Vaccines I recommend: Tdap, flu shot, Shingrix, RSV, PNM 20, COVID. PSA 08-2021: 0.16. RTC for CPX and blood work in 3 to 4 weeks.

## 2022-07-29 NOTE — Assessment & Plan Note (Signed)
COPD: Good compliance with inhalers, reports chronic cough with some sputum production but denies hemoptysis Pulmonary nodule incidentally found on CT outside hospital 12-2020.   new right middle lobe nodule however best seen on image number 89 of series 3 which has a somewhat spiculated margin and measures approximately 8 mm. This is new from the prior exam from 2020.   Referral to pulmonary failed.  Will order CT chest without contrast for a follow-up Cirrhosis last seen by GI November 2021, they noted next colonoscopy was due 2023 for tubular adenomas. He was also supposed to be [facility] but he does not plan to go back there.  Fortunately denies nausea vomiting or blood in the stools.   Out of omeprazole, Lasix and propranolol today.  RFs sent. Labs on RTC EtOH: He states he is drinking socially only Tobacco: States he is not smoking, he uses a nicotine pouch as needed Preventive care Vaccines I recommend: Tdap, flu shot, Shingrix, RSV, PNM 20, COVID. PSA 08-2021: 0.16. RTC for CPX and blood work in 3 to 4 weeks.

## 2022-08-03 ENCOUNTER — Telehealth: Payer: Self-pay | Admitting: Internal Medicine

## 2022-08-03 NOTE — Telephone Encounter (Signed)
[facility] was requesting clarification on Glycopyrrolate-Formoterol (BEVESPI AEROSPHERE) 9-4.8 MCG/ACT AERO. Stated the normal dose is 2 puffs twice daily, but pt was only instructed once daily. Please advise correct instructions.

## 2022-08-03 NOTE — Telephone Encounter (Signed)
Ok to increase to 2 puffs twice daily. Will discuss with the patient at the next opportunity.

## 2022-08-03 NOTE — Telephone Encounter (Signed)
Please advise 

## 2022-08-03 NOTE — Telephone Encounter (Signed)
Spoke w/ [facility]- informed 2 puffs bid is correct sig. Updated on med list.

## 2022-08-05 ENCOUNTER — Telehealth (HOSPITAL_BASED_OUTPATIENT_CLINIC_OR_DEPARTMENT_OTHER): Payer: Self-pay

## 2022-08-08 ENCOUNTER — Encounter: Payer: Medicare HMO | Admitting: Pharmacist

## 2022-08-08 NOTE — Progress Notes (Signed)
Pharmacy Note  08/09/2022 Name: William Whitaker MRN: [id number] DOB: [birth year]  Subjective: William Whitaker is a 62 y.o. year old male who is a primary care patient of Colon Branch, MD. Clinical Pharmacist Practitioner referral was placed to assist with medication and COPD / smoking cessation management.    Engaged with patient by telephone for follow up visit today.  Hypertension:  COPD:  Nicotine Use:     Objective: Review of patient status, including review of consultants reports, laboratory and other test data, was performed as part of comprehensive evaluation and provision of chronic care management services.   Lab Results  Component Value Date   CREATININE 0.84 08/12/2021   CREATININE 0.71 01/31/2021   CREATININE 0.78 09/29/2020    Lab Results  Component Value Date   HGBA1C 5.3 09/29/2020    BP Readings from Last 3 Encounters:  07/28/22 136/78  08/12/21 140/80  01/31/21 124/80     No Known Allergies  Medications Reviewed Today    Reviewed by Cherre Robins, RPH-CPP (Pharmacist) on 08/08/22 at [facility] List Status: <None>  Medication Order Taking? Sig Documenting Provider Last Dose Status Informant  albuterol (VENTOLIN HFA) 108 (90 Base) MCG/ACT inhaler [id number] No TAKE 2 PUFFS BY MOUTH EVERY 6 HOURS AS NEEDED FOR WHEEZE OR SHORTNESS OF BREATH Copland, Gay Filler, MD Taking Active   Cholecalciferol (VITAMIN D3) 50 MCG (2000 UT) TABS [id number] No Take 1 tablet by mouth daily. [provider] Taking Active   furosemide (LASIX) 20 MG tablet [id number]  Take 1 tablet (20 mg total) by mouth daily. Colon Branch, MD  Active   Glycopyrrolate-Formoterol (BEVESPI AEROSPHERE) 9-4.8 MCG/ACT Hollie Salk [id number]  Inhale 2 puffs into the lungs in the morning and at bedtime. Kathlene November E, MD  Active   ipratropium (ATROVENT) 0.02 % nebulizer solution [id number] No USE 1 VIAL VIA NEBULIZER 4 TIMES DAILY Colon Branch, MD Taking Active   Iron-Vitamins [place]) [place] [id number] No Take 1  Dose by mouth daily. [provider] Taking Active   Magnesium 250 MG TABS [id number] No Take 1 tablet by mouth daily. [provider] Taking Active   Multiple Vitamins-Minerals (ONE-A-DAY MENS VITACRAVES PO) [id number] No Chew 1 tablet by mouth daily. [provider] Taking Active Self  omeprazole (PRILOSEC) 20 MG capsule [id number]  TAKE 1 CAPSULE BY MOUTH EVERY DAY Paz, Alda Berthold, MD  Active   potassium chloride SA (KLOR-CON M) 20 MEQ tablet [id number] No TAKE 1 TABLET BY MOUTH DAILY  Patient taking differently: Take 0.5 tablets by mouth daily.   Colon Branch, MD Taking Active   propranolol (INDERAL) 20 MG tablet [id number]  Take 1 tablet (20 mg total) by mouth 2 (two) times daily. Colon Branch, MD  Active   spironolactone (ALDACTONE) 50 MG tablet [id number]  Take 1 tablet (50 mg total) by mouth daily. Colon Branch, MD  Active   topiramate (TOPAMAX) 25 MG tablet [id number]  Take 1 tablet (25 mg total) by mouth daily. Colon Branch, MD  Active   vitamin C (ASCORBIC ACID) 250 MG tablet [id number] No Take 250 mg by mouth daily. [provider] Taking Active Self  Vitamin Mixture (VITAMIN E COMPLETE PO) [id number] No Take 1 capsule by mouth daily. [provider] Taking Active           Patient Active Problem List   Diagnosis Date Noted  . Abdominal ascites 12/29/2021  . Chronic hepatitis C virus infection ([place])  12/29/2021  . Esophageal varices ([place]) 12/29/2021  . Annual physical exam 08/12/2021  . Insomnia 09/29/2020  . COPD (chronic obstructive pulmonary disease) ([place]) 04/09/2019  . PCP NOTES >>>>>>>>>>>>>>>>>> 02/05/2019  . Chronic hepatitis ([place]) 02/05/2019  . EtOH dependence ([place]) 01/25/2019  . Tobacco dependence 01/25/2019  . Cirrhosis of liver ([place]) 01/25/2019  . Hyponatremia 01/25/2019  . Iron deficiency anemia   . Symptomatic anemia 01/24/2019  . Anxiety 08/20/2007  . 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 G E R D 08/20/2007     Medication  Assistance:   Bevespi obtained through [place] medication assistance program.  Enrollment ends 07/02/2022   Assessment:   Medication management COPD / smoking cessation  Plan: Started process for 2024 re enrollment for Bevespi. Will create letter for patient stating he has not additional insurance coverage, just [employer].   Continue Bevespi inhaler (patient has 3 inhalers on hand currently)  Continue to use nicotine replacement as needed to refrain from smoking.   Follow Up:  Telephone follow up appointment with care management team member scheduled for:  2 months   Cherre Robins, PharmD Clinical Pharmacist [place] [phone] This encounter was created in error - please disregard.

## 2022-08-14 ENCOUNTER — Telehealth (HOSPITAL_BASED_OUTPATIENT_CLINIC_OR_DEPARTMENT_OTHER): Payer: Self-pay

## 2022-08-28 ENCOUNTER — Encounter: Payer: Self-pay | Admitting: Internal Medicine

## 2022-08-29 ENCOUNTER — Encounter: Payer: Medicare HMO | Admitting: Internal Medicine

## 2022-09-29 ENCOUNTER — Other Ambulatory Visit: Payer: Self-pay | Admitting: Family Medicine

## 2022-10-20 ENCOUNTER — Other Ambulatory Visit: Payer: Self-pay | Admitting: Internal Medicine

## 2022-10-24 ENCOUNTER — Other Ambulatory Visit: Payer: Self-pay | Admitting: Internal Medicine

## 2022-11-16 ENCOUNTER — Other Ambulatory Visit: Payer: Self-pay | Admitting: Internal Medicine

## 2022-11-18 ENCOUNTER — Other Ambulatory Visit: Payer: Self-pay | Admitting: Internal Medicine

## 2022-12-04 ENCOUNTER — Other Ambulatory Visit: Payer: Self-pay | Admitting: Internal Medicine

## 2022-12-07 ENCOUNTER — Other Ambulatory Visit: Payer: Self-pay | Admitting: Internal Medicine

## 2022-12-14 ENCOUNTER — Other Ambulatory Visit: Payer: Self-pay | Admitting: Internal Medicine

## 2022-12-20 ENCOUNTER — Telehealth: Payer: Self-pay | Admitting: Internal Medicine

## 2022-12-20 ENCOUNTER — Other Ambulatory Visit: Payer: Self-pay | Admitting: Internal Medicine

## 2022-12-20 NOTE — Telephone Encounter (Signed)
He was due for a visit back in February- unfortunately we can't keep refilling meds w/o appt.

## 2022-12-20 NOTE — Telephone Encounter (Signed)
Patient called and would like a med refill on the Topiramate, he is aware that he needs an appt. He just said he will be going out of town to care for his step dad that will be having open heart surgery and will be gone for a month and didn't want to run out. Please call

## 2022-12-31 ENCOUNTER — Other Ambulatory Visit: Payer: Self-pay | Admitting: Internal Medicine

## 2023-01-01 ENCOUNTER — Telehealth: Payer: Self-pay | Admitting: Internal Medicine

## 2023-01-01 NOTE — Telephone Encounter (Signed)
See telephone note from 12/20/22.

## 2023-01-01 NOTE — Telephone Encounter (Signed)
Pt called to see if he would be ok without his topiramate until he returns from [place] in a few weeks. If not, would it be possible to see about sending in a months supply. 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

## 2023-01-14 ENCOUNTER — Other Ambulatory Visit: Payer: Self-pay | Admitting: Internal Medicine

## 2023-01-19 ENCOUNTER — Other Ambulatory Visit: Payer: Self-pay | Admitting: Internal Medicine

## 2023-01-23 ENCOUNTER — Telehealth: Payer: Self-pay

## 2023-01-26 ENCOUNTER — Ambulatory Visit: Payer: Medicare HMO | Admitting: Internal Medicine

## 2023-01-26 ENCOUNTER — Encounter: Payer: Self-pay | Admitting: Internal Medicine

## 2023-01-26 VITALS — BP 142/82 | HR 79 | Temp 97.9°F | Resp 18 | Ht 73.0 in | Wt 228.1 lb

## 2023-01-26 DIAGNOSIS — F17200 Nicotine dependence, unspecified, uncomplicated: Secondary | ICD-10-CM

## 2023-01-26 DIAGNOSIS — Z91199 Patient's noncompliance with other medical treatment and regimen due to unspecified reason: Secondary | ICD-10-CM | POA: Diagnosis not present

## 2023-01-26 DIAGNOSIS — R918 Other nonspecific abnormal finding of lung field: Secondary | ICD-10-CM

## 2023-01-26 DIAGNOSIS — K7460 Unspecified cirrhosis of liver: Secondary | ICD-10-CM | POA: Diagnosis not present

## 2023-01-26 DIAGNOSIS — J449 Chronic obstructive pulmonary disease, unspecified: Secondary | ICD-10-CM

## 2023-01-26 DIAGNOSIS — Z87891 Personal history of nicotine dependence: Secondary | ICD-10-CM | POA: Diagnosis not present

## 2023-01-26 LAB — CBC WITH DIFFERENTIAL/PLATELET
Absolute Monocytes: 843 cells/uL (ref 200–950)
Basophils Absolute: 37 cells/uL (ref 0–200)
Basophils Relative: 0.6 %
Eosinophils Absolute: 93 cells/uL (ref 15–500)
Eosinophils Relative: 1.5 %
HCT: 41.4 % (ref 38.5–50.0)
Hemoglobin: 14.7 g/dL (ref 13.2–17.1)
Lymphs Abs: 1283 cells/uL (ref 850–3900)
MCH: 33.1 pg — ABNORMAL HIGH (ref 27.0–33.0)
MCHC: 35.5 g/dL (ref 32.0–36.0)
MCV: 93.2 fL (ref 80.0–100.0)
MPV: 10.6 fL (ref 7.5–12.5)
Monocytes Relative: 13.6 %
Neutro Abs: 3943 cells/uL (ref 1500–7800)
Neutrophils Relative %: 63.6 %
RBC: 4.44 10*6/uL (ref 4.20–5.80)
RDW: 12.5 % (ref 11.0–15.0)
Total Lymphocyte: 20.7 %
WBC: 6.2 10*3/uL (ref 3.8–10.8)

## 2023-01-26 MED ORDER — SPIRONOLACTONE 50 MG PO TABS: 50.0000 mg | ORAL_TABLET | Freq: Every day | ORAL | 0 refills | Status: DC

## 2023-01-26 MED ORDER — PROPRANOLOL HCL 20 MG PO TABS: 20.0000 mg | ORAL_TABLET | Freq: Two times a day (BID) | ORAL | 0 refills | Status: DC

## 2023-01-26 MED ORDER — OMEPRAZOLE 20 MG PO CPDR: 20.0000 mg | DELAYED_RELEASE_CAPSULE | Freq: Every day | ORAL | 0 refills | Status: DC

## 2023-01-26 MED ORDER — FUROSEMIDE 20 MG PO TABS: 20.0000 mg | ORAL_TABLET | Freq: Every day | ORAL | 0 refills | Status: DC

## 2023-01-26 NOTE — Addendum Note (Signed)
Addended byConrad Sherwood D on: 01/26/2023 02:28 PM   Modules accepted: Orders

## 2023-01-26 NOTE — Patient Instructions (Addendum)
Please call your gastroenterologist, you are due for a checkup and a colonoscopy. You can reach them at [phone]  Stop by the first floor and schedule a CAT scan of your chest   Vaccines I recommend: Tdap (tetanus) Shingrix (shingles) RSV vaccine Pneumonia shot (PNM 20) Flu shot this fall COVID booster     GO TO THE LAB : Get the blood work     GO TO THE FRONT DESK, PLEASE SCHEDULE YOUR APPOINTMENTS Come back for a physical in 3 months   I will not be able to continue be  your doctor if you cannot follow most of my advice

## 2023-01-26 NOTE — Progress Notes (Signed)
Subjective:    Patient ID: William Whitaker, male    DOB: 1961/01/15, 62 y.o.   MRN: [id number]  DOS:  01/26/2023 Type of visit - description: Follow-up  Since the last office visit, he demonstrated poor compliance with advice. States he feels well.   Review of Systems See above   Past Medical History:  Diagnosis Date   Anxiety    Appendicitis    Bronchitis    GERD (gastroesophageal reflux disease)    Insomnia    Recurrent incisional hernia     Past Surgical History:  Procedure Laterality Date   APPENDECTOMY  01/12/2021   open   BIOPSY  01/27/2019   Procedure: BIOPSY;  Surgeon: Lemar Lofty., MD;  Location: [facility];  Service: Gastroenterology;;   COLONOSCOPY WITH PROPOFOL N/A 01/27/2019   Procedure: COLONOSCOPY WITH PROPOFOL;  Surgeon: Lemar Lofty., MD;  Location: [facility];  Service: Gastroenterology;  Laterality: N/A;   ESOPHAGOGASTRODUODENOSCOPY (EGD) WITH PROPOFOL N/A 01/27/2019   Procedure: ESOPHAGOGASTRODUODENOSCOPY (EGD) WITH PROPOFOL;  Surgeon: Meridee Score Netty Starring., MD;  Location: [facility];  Service: Gastroenterology;  Laterality: N/A;   HERNIA REPAIR Right remotely as a baby   INCISIONAL HERNIA REPAIR  01/02/2022   open   POLYPECTOMY  01/27/2019   Procedure: POLYPECTOMY;  Surgeon: Mansouraty, Netty Starring., MD;  Location: [facility];  Service: Gastroenterology;;    Current Outpatient Medications  Medication Instructions   albuterol (VENTOLIN HFA) 108 (90 Base) MCG/ACT inhaler 2 puffs, Inhalation, Every 6 hours PRN, Due for appt. No further refills w/o appt   Cholecalciferol (VITAMIN D3) 50 MCG (2000 UT) TABS 1 tablet, Oral, Daily   furosemide (LASIX) 20 mg, Oral, Daily   Glycopyrrolate-Formoterol (BEVESPI AEROSPHERE) 9-4.8 MCG/ACT AERO 2 puffs, Inhalation, 2 times daily   ipratropium (ATROVENT) 0.02 % nebulizer solution USE 1 VIAL VIA NEBULIZER 4 TIMES DAILY   Iron-Vitamins (GERITOL) LIQD 1 Dose, Oral, Daily   Magnesium 250 MG  TABS 1 tablet, Oral, Daily   Multiple Vitamins-Minerals (ONE-A-DAY MENS VITACRAVES PO) 1 tablet, Oral, Daily   omeprazole (PRILOSEC) 20 mg, Oral, Daily   potassium chloride SA (KLOR-CON M) 20 MEQ tablet TAKE 1 TABLET BY MOUTH DAILY   propranolol (INDERAL) 20 mg, Oral, 2 times daily   spironolactone (ALDACTONE) 50 mg, Oral, Daily   vitamin C (ASCORBIC ACID) 250 mg, Oral, Daily   Vitamin Mixture (VITAMIN E COMPLETE PO) 1 capsule, Oral, Daily       Objective:   Physical Exam BP (!) 142/82   Pulse 79   Temp 97.9 F (36.6 C) (Oral)   Resp 18   Ht 6\' 1"  (1.854 m)   Wt 228 lb 2 oz (103.5 kg)   SpO2 95%   BMI 30.10 kg/m  General:   Well developed, NAD, BMI noted. HEENT:  Normocephalic . Face symmetric, atraumatic Lungs:  CTA B Normal respiratory effort, no intercostal retractions, no accessory muscle use. Heart: RRR,  no murmur.  Lower extremities: no pretibial edema bilaterally  Skin: Not pale. Not jaundice Neurologic:  alert & oriented X3.  Speech normal, gait appropriate for age and unassisted Psych--  Cognition and judgment appear intact.  Cooperative with normal attention span and concentration.  Behavior appropriate. No anxious or depressed appearing.      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 Hep C treated and quit  alcohol. Tobacco abuse Anemia, severe. Perforated appendix 12-2020   PLAN CMP CBC CT chest  Compliance:  states he is busy taking care of a family member who had a CABG but this has been a chronic issue.  Will have to dismiss him from our office if discontinued. Marland Kitchen COPD: Reports he takes Bevespi regularly and from time to times use a rescue inhaler.  Will refill medications for the next 3 months Tobacco abuse: Using nicotine pouches, reports he is not smoking Pulmonary nodule: Did not pursue a CT.   Advised of the importance of follow-up on pulmonary nodule.  early lung cancer?. EtOH: Drinks socially.  Ran out of Topamax and feels no difference.  Will stop Topamax and restart if he sees any benefits. Cirrhosis: Due for a visit with hepatology, due for a colonoscopy, encouraged to call. Plan: RF Lasix (ran out 3 days ago), refill as needed Inderal, Aldactone, potassium, PPIs. Vaccine advice provided.  He is somewhat hesitant to proceed with vaccines RTC 3 months CPX  1---

## 2023-01-27 LAB — CBC WITH DIFFERENTIAL/PLATELET: Platelets: 118 10*3/uL — ABNORMAL LOW (ref 140–400)

## 2023-01-28 NOTE — Assessment & Plan Note (Signed)
Compliance:  states he is busy taking care of a family member who had a CABG but poor compliance has been a chronic issue.  Patient aware he will be dismissed if poor compliance continues. COPD: Reports he takes Bevespi regularly and from time to times use a rescue inhaler.  Will refill medications for the next 3 months Tobacco abuse: Using nicotine pouches, reports he is not smoking Pulmonary nodule: Did not pursue a CT.  Advised of the importance of follow-up on pulmonary nodule.  early lung cancer?. EtOH: Drinks socially.  Ran out of Topamax and feels no different.  Will stop Topamax and restart if he sees any benefits. Cirrhosis: Due for a visit with hepatology, due for a colonoscopy, encouraged to call. Plan: RF Lasix (ran out 3 days ago), refill as needed Inderal, Aldactone, potassium, PPIs. Vaccine advice provided.  He is somewhat hesitant to proceed with vaccines RTC 3 months CPX

## 2023-01-31 ENCOUNTER — Telehealth (HOSPITAL_BASED_OUTPATIENT_CLINIC_OR_DEPARTMENT_OTHER): Payer: Self-pay

## 2023-02-12 ENCOUNTER — Other Ambulatory Visit: Payer: Self-pay | Admitting: Internal Medicine

## 2023-02-16 ENCOUNTER — Ambulatory Visit (HOSPITAL_BASED_OUTPATIENT_CLINIC_OR_DEPARTMENT_OTHER)
Admission: RE | Admit: 2023-02-16 | Discharge: 2023-02-16 | Disposition: A | Discharge status: Home / Self Care | Payer: Medicare HMO | Source: Ambulatory Visit | Attending: Internal Medicine | Admitting: Internal Medicine

## 2023-02-16 DIAGNOSIS — Z87891 Personal history of nicotine dependence: Secondary | ICD-10-CM | POA: Insufficient documentation

## 2023-02-16 DIAGNOSIS — R161 Splenomegaly, not elsewhere classified: Secondary | ICD-10-CM | POA: Diagnosis not present

## 2023-02-16 DIAGNOSIS — R918 Other nonspecific abnormal finding of lung field: Secondary | ICD-10-CM

## 2023-02-28 ENCOUNTER — Other Ambulatory Visit: Payer: Self-pay

## 2023-02-28 DIAGNOSIS — R918 Other nonspecific abnormal finding of lung field: Secondary | ICD-10-CM

## 2023-03-12 ENCOUNTER — Encounter: Payer: Self-pay | Admitting: Pulmonary Disease

## 2023-03-12 ENCOUNTER — Ambulatory Visit: Payer: Medicare HMO | Admitting: Pulmonary Disease

## 2023-03-12 VITALS — BP 138/80 | HR 73 | Ht 73.0 in | Wt 230.0 lb

## 2023-03-12 DIAGNOSIS — R911 Solitary pulmonary nodule: Secondary | ICD-10-CM

## 2023-03-12 DIAGNOSIS — J449 Chronic obstructive pulmonary disease, unspecified: Secondary | ICD-10-CM

## 2023-03-12 NOTE — Progress Notes (Signed)
Synopsis: Referred in September 2024 for pulmonary nodules by Wanda Plump, MD  Subjective:   PATIENT ID: William Whitaker GENDER: male DOB: March 12, 1961, MRN: [id number]  Chief Complaint  Patient presents with   Consult    Consult on lung nodules    This is a 62 year old gentleman, Past medical history of anxiety, bronchitis, gastroesophageal reflux, he has had colonoscopy and EGD in the past, hernia repair.  He was referred for abnormal CT imaging multiple pulmonary nodules.  He is a former smoker quit in April 2023.  At 1 point time he was smoking 3 packs/day.  Patient had CT scan of the chest completed in August 2024.  He was found to have 9.7 mm irregular nodule in the lingula.  As well as a 2 mm subpleural nodule in the posterior right upper lobe.  He had other nodules within the chest.  Patient CT scan of the chest has that new irregular nodule reviewed today in the office.  He is scared about everything that is going on.  Anxious about having the new nodules.  He has quit smoking.  He is present today with his son.  Using [place] as his inhaler regimen with as needed albuterol has been told he has COPD in the past no recent PFTs.    Past Medical History:  Diagnosis Date   Anxiety    Appendicitis    Bronchitis    GERD (gastroesophageal reflux disease)    Insomnia    Recurrent incisional hernia      Family History  Problem Relation Age of Onset   Muscular dystrophy Mother    CAD Neg Hx    Diabetes Neg Hx    Cancer Neg Hx      Past Surgical History:  Procedure Laterality Date   APPENDECTOMY  01/12/2021   open   BIOPSY  01/27/2019   Procedure: BIOPSY;  Surgeon: Lemar Lofty., MD;  Location: [facility];  Service: Gastroenterology;;   COLONOSCOPY WITH PROPOFOL N/A 01/27/2019   Procedure: COLONOSCOPY WITH PROPOFOL;  Surgeon: Lemar Lofty., MD;  Location: [facility];  Service: Gastroenterology;  Laterality: N/A;   ESOPHAGOGASTRODUODENOSCOPY (EGD) WITH  PROPOFOL N/A 01/27/2019   Procedure: ESOPHAGOGASTRODUODENOSCOPY (EGD) WITH PROPOFOL;  Surgeon: Meridee Score Netty Starring., MD;  Location: [facility];  Service: Gastroenterology;  Laterality: N/A;   HERNIA REPAIR Right remotely as a baby   INCISIONAL HERNIA REPAIR  01/02/2022   open   POLYPECTOMY  01/27/2019   Procedure: POLYPECTOMY;  Surgeon: Mansouraty, Netty Starring., MD;  Location: [facility];  Service: Gastroenterology;;    Social History   Socioeconomic History   Marital status: Divorced    Spouse name: Not on file   Number of children: Not on file   Years of education: Not on file   Highest education level: Not on file  Occupational History   Occupation: disability d/t COPD  Tobacco Use   Smoking status: Former    Current packs/day: 0.00    Average packs/day: 0.5 packs/day for 30.0 years (15.0 ttl pk-yrs)    Types: Cigarettes    Start date: 10/25/1991    Quit date: 10/24/2021    Years since quitting: 1.3   Smokeless tobacco: Never   Tobacco comments:    Quit smoking for 4 months. If he does smoke it's 1 cigarette a week. 03/12/23 Tay  Vaping Use   Vaping status: Former  Substance and Sexual Activity   Alcohol use: Not Currently    Alcohol/week: 60.0 standard drinks of  alcohol    Types: 60 Cans of beer per week    Comment: denies on 08/06/2019   Drug use: No   Sexual activity: Not on file  Other Topics Concern   Not on file  Social History Narrative   Not on file   Social Determinants of Health   Financial Resource Strain: High Risk (11/04/2021)   Overall Financial Resource Strain (CARDIA)    Difficulty of Paying Living Expenses: Hard  Food Insecurity: No Food Insecurity (04/03/2022)   Hunger Vital Sign    Worried About Running Out of Food in the Last Year: Never true    Ran Out of Food in the Last Year: Never true  Transportation Needs: No Transportation Needs (04/03/2022)   PRAPARE - Administrator, Civil Service (Medical): No    Lack of Transportation  (Non-Medical): No  Physical Activity: Inactive (03/02/2021)   Exercise Vital Sign    Days of Exercise per Week: 0 days    Minutes of Exercise per Session: 0 min  Stress: No Stress Concern Present (04/03/2022)   [employer] of Occupational Health - Occupational Stress Questionnaire    Feeling of Stress : Not at all  Social Connections: Moderately Isolated (04/03/2022)   Social Connection and Isolation Panel [NHANES]    Frequency of Communication with Friends and Family: More than three times a week    Frequency of Social Gatherings with Friends and Family: More than three times a week    Attends Religious Services: 1 to 4 times per year    Active Member of [employer] or Organizations: No    Attends Banker Meetings: Never    Marital Status: Divorced  Catering manager Violence: Not At Risk (04/03/2022)   Humiliation, Afraid, Rape, and Kick questionnaire    Fear of Current or Ex-Partner: No    Emotionally Abused: No    Physically Abused: No    Sexually Abused: No     No Known Allergies   Outpatient Medications Prior to Visit  Medication Sig Dispense Refill   albuterol (VENTOLIN HFA) 108 (90 Base) MCG/ACT inhaler Inhale 2 puffs into the lungs every 6 (six) hours as needed for wheezing or shortness of breath. 18 g 5   Cholecalciferol (VITAMIN D3) 50 MCG (2000 UT) TABS Take 1 tablet by mouth daily.     furosemide (LASIX) 20 MG tablet Take 1 tablet (20 mg total) by mouth daily. 90 tablet 0   Glycopyrrolate-Formoterol (BEVESPI AEROSPHERE) 9-4.8 MCG/ACT AERO Inhale 2 puffs into the lungs in the morning and at bedtime.     ipratropium (ATROVENT) 0.02 % nebulizer solution USE 1 VIAL VIA NEBULIZER 4 TIMES DAILY 812.5 mL 0   Iron-Vitamins (GERITOL) LIQD Take 1 Dose by mouth daily.     Magnesium 250 MG TABS Take 1 tablet by mouth daily.     Multiple Vitamins-Minerals (ONE-A-DAY MENS VITACRAVES PO) Chew 1 tablet by mouth daily.     omeprazole (PRILOSEC) 20 MG capsule Take 1 capsule (20  mg total) by mouth daily. 90 capsule 0   potassium chloride SA (KLOR-CON M) 20 MEQ tablet TAKE 1 TABLET BY MOUTH DAILY (Patient taking differently: Take 0.5 tablets by mouth daily.) 90 tablet 0   propranolol (INDERAL) 20 MG tablet Take 1 tablet (20 mg total) by mouth 2 (two) times daily. 180 tablet 0   spironolactone (ALDACTONE) 50 MG tablet Take 1 tablet (50 mg total) by mouth daily. 90 tablet 0   vitamin C (ASCORBIC ACID) 250 MG  tablet Take 250 mg by mouth daily.     Vitamin Mixture (VITAMIN E COMPLETE PO) Take 1 capsule by mouth daily.     No facility-administered medications prior to visit.    Review of Systems  Constitutional:  Negative for chills, fever, malaise/fatigue and weight loss.  HENT:  Negative for hearing loss, sore throat and tinnitus.   Eyes:  Negative for blurred vision and double vision.  Respiratory:  Positive for cough and shortness of breath. Negative for hemoptysis, sputum production, wheezing and stridor.   Cardiovascular:  Negative for chest pain, palpitations, orthopnea, leg swelling and PND.  Gastrointestinal:  Negative for abdominal pain, constipation, diarrhea, heartburn, nausea and vomiting.  Genitourinary:  Negative for dysuria, hematuria and urgency.  Musculoskeletal:  Negative for joint pain and myalgias.  Skin:  Negative for itching and rash.  Neurological:  Negative for dizziness, tingling, weakness and headaches.  Endo/Heme/Allergies:  Negative for environmental allergies. Does not bruise/bleed easily.  Psychiatric/Behavioral:  Negative for depression. The patient is not nervous/anxious and does not have insomnia.   All other systems reviewed and are negative.    Objective:  Physical Exam Vitals reviewed.  Constitutional:      General: He is not in acute distress.    Appearance: He is well-developed.  HENT:     Head: Normocephalic and atraumatic.  Eyes:     General: No scleral icterus.    Conjunctiva/sclera: Conjunctivae normal.     Pupils:  Pupils are equal, round, and reactive to light.  Neck:     Vascular: No JVD.     Trachea: No tracheal deviation.  Cardiovascular:     Rate and Rhythm: Normal rate and regular rhythm.     Heart sounds: Normal heart sounds. No murmur heard. Pulmonary:     Effort: Pulmonary effort is normal. No tachypnea, accessory muscle usage or respiratory distress.     Breath sounds: No stridor. No wheezing, rhonchi or rales.  Abdominal:     General: There is no distension.     Palpations: Abdomen is soft.     Tenderness: There is no abdominal tenderness.  Musculoskeletal:        General: No tenderness.     Cervical back: Neck supple.  Lymphadenopathy:     Cervical: No cervical adenopathy.  Skin:    General: Skin is warm and dry.     Capillary Refill: Capillary refill takes less than 2 seconds.     Findings: No rash.  Neurological:     Mental Status: He is alert and oriented to person, place, and time.  Psychiatric:        Behavior: Behavior normal.      Vitals:   03/12/23 1435  BP: 138/80  Pulse: 73  SpO2: 96%  Weight: 230 lb (104.3 kg)  Height: 6\' 1"  (1.854 m)   96% on RA BMI Readings from Last 3 Encounters:  03/12/23 30.34 kg/m  01/26/23 30.10 kg/m  07/28/22 29.06 kg/m   Wt Readings from Last 3 Encounters:  03/12/23 230 lb (104.3 kg)  01/26/23 228 lb 2 oz (103.5 kg)  07/28/22 220 lb 4 oz (99.9 kg)     CBC    Component Value Date/Time   WBC 6.2 01/26/2023 1430   RBC 4.44 01/26/2023 1430   HGB 14.7 01/26/2023 1430   HGB 9.0 (L) 03/05/2020 1300   HCT 41.4 01/26/2023 1430   HCT 23.7 (L) 01/25/2019 0544   PLT 118 (L) 01/26/2023 1430   PLT 202 03/05/2020 1300  MCV 93.2 01/26/2023 1430   MCH 33.1 (H) 01/26/2023 1430   MCHC 35.5 01/26/2023 1430   RDW 12.5 01/26/2023 1430   LYMPHSABS 1,283 01/26/2023 1430   MONOABS 0.8 01/31/2021 1205   EOSABS 93 01/26/2023 1430   BASOSABS 37 01/26/2023 1430      Chest Imaging:  August 2024 CT chest: 9 mm irregular nodule  in the lingula other scattered small pulmonary nodules and emphysema. The patient's images have been independently reviewed by me.    Pulmonary Functions Testing Results:     No data to display          FeNO:   Pathology:   Echocardiogram:   Heart Catheterization:     Assessment & Plan:     ICD-10-CM   1. Lung nodule  R91.1 CT Chest Wo Contrast    Pulmonary Function Test    2. Chronic obstructive pulmonary disease, unspecified COPD type (HCC)  J44.9       Discussion:  This is a 62 year old gentleman, history of pulmonary nodules.  Had prior pulmonary nodule in 2020 that was calcified in the center which appears stable on follow-up CT imaging.  Was told in the past that he has COPD.  He now has a new irregular nodule in the lingula.  Plan: Patient is a former smoker, was counseled on smoking cessation and maintenance of this. Okay to continue nicotine lozenges. Continue Breztri for inhaler regimen for COPD. Continue albuterol as needed Return to clinic in 3 months for CT follow-up noncontrasted for evaluation of the pulmonary nodule and repeat pulmonary function test.    Current Outpatient Medications:    albuterol (VENTOLIN HFA) 108 (90 Base) MCG/ACT inhaler, Inhale 2 puffs into the lungs every 6 (six) hours as needed for wheezing or shortness of breath., Disp: 18 g, Rfl: 5   Cholecalciferol (VITAMIN D3) 50 MCG (2000 UT) TABS, Take 1 tablet by mouth daily., Disp: , Rfl:    furosemide (LASIX) 20 MG tablet, Take 1 tablet (20 mg total) by mouth daily., Disp: 90 tablet, Rfl: 0   Glycopyrrolate-Formoterol (BEVESPI AEROSPHERE) 9-4.8 MCG/ACT AERO, Inhale 2 puffs into the lungs in the morning and at bedtime., Disp: , Rfl:    ipratropium (ATROVENT) 0.02 % nebulizer solution, USE 1 VIAL VIA NEBULIZER 4 TIMES DAILY, Disp: 812.5 mL, Rfl: 0   Iron-Vitamins (GERITOL) LIQD, Take 1 Dose by mouth daily., Disp: , Rfl:    Magnesium 250 MG TABS, Take 1 tablet by mouth daily., Disp: ,  Rfl:    Multiple Vitamins-Minerals (ONE-A-DAY MENS VITACRAVES PO), Chew 1 tablet by mouth daily., Disp: , Rfl:    omeprazole (PRILOSEC) 20 MG capsule, Take 1 capsule (20 mg total) by mouth daily., Disp: 90 capsule, Rfl: 0   potassium chloride SA (KLOR-CON M) 20 MEQ tablet, TAKE 1 TABLET BY MOUTH DAILY (Patient taking differently: Take 0.5 tablets by mouth daily.), Disp: 90 tablet, Rfl: 0   propranolol (INDERAL) 20 MG tablet, Take 1 tablet (20 mg total) by mouth 2 (two) times daily., Disp: 180 tablet, Rfl: 0   spironolactone (ALDACTONE) 50 MG tablet, Take 1 tablet (50 mg total) by mouth daily., Disp: 90 tablet, Rfl: 0   vitamin C (ASCORBIC ACID) 250 MG tablet, Take 250 mg by mouth daily., Disp: , Rfl:    Vitamin Mixture (VITAMIN E COMPLETE PO), Take 1 capsule by mouth daily., Disp: , Rfl:     Josephine Igo, DO [facility] 03/12/2023 3:00 PM

## 2023-03-12 NOTE — Patient Instructions (Signed)
Thank you for visiting Dr. Tonia Brooms at [facility]. Today we recommend the following:  Orders Placed This Encounter  Procedures   CT Chest Wo Contrast   Pulmonary Function Test   Follow up appt after tests complete  Continue breztri twice daily and albuterol   Return in about 3 months (around 06/11/2023) for with APP or Dr. Tonia Brooms, after CT Chest, after PFTs.    Please do your part to reduce the spread of COVID-19.

## 2023-04-03 ENCOUNTER — Telehealth: Payer: Self-pay | Admitting: Internal Medicine

## 2023-04-03 NOTE — Telephone Encounter (Signed)
Copied from CRM [id number]. Topic: Medicare AWV >> Apr 03, 2023 11:23 AM Payton Doughty wrote: Reason for CRM: Called LM 04/03/2023 to schedule AWV   Verlee Rossetti; Care Guide Ambulatory Clinical Support [place] [facility] Direct Dial: [phone]

## 2023-04-21 ENCOUNTER — Other Ambulatory Visit: Payer: Self-pay | Admitting: Internal Medicine

## 2023-04-23 ENCOUNTER — Other Ambulatory Visit: Payer: Self-pay | Admitting: Internal Medicine

## 2023-05-02 ENCOUNTER — Ambulatory Visit (INDEPENDENT_AMBULATORY_CARE_PROVIDER_SITE_OTHER): Payer: Medicare HMO | Admitting: Pharmacist

## 2023-05-02 DIAGNOSIS — J449 Chronic obstructive pulmonary disease, unspecified: Secondary | ICD-10-CM

## 2023-05-02 NOTE — Progress Notes (Signed)
Pharmacy Note  05/02/2023 Name: William Whitaker MRN: [id number] DOB: 1960-07-11  Subjective: William Whitaker is a 62 y.o. year old male who is a primary care patient of Wanda Plump, MD. Clinical Pharmacist Practitioner referral was placed to assist with medication and COPD /  Engaged with patient by telephone for follow up visit today.  COPD:  Monitored by Dr Marcellina Millin - pulmonologist  Current therapy:  Bevespi - 2 puffs into lungs twice a day Albuterol inhaler - 2 puffs every 6 hours as needed for shortness of breath/ wheezing.  Patient reports he uses albuterol each morning and then as needed during the day. Usually only needs additional dose once or twice per week.   He [place] for 2025. He has been [place] in [place] Program for the last 2 years.    Objective: Review of patient status, including review of consultants reports, laboratory and other test data, was performed as part of comprehensive evaluation and provision of chronic care management services.   Lab Results  Component Value Date   CREATININE 0.70 01/26/2023   CREATININE 0.84 08/12/2021   CREATININE 0.71 01/31/2021    Lab Results  Component Value Date   HGBA1C 5.3 09/29/2020    BP Readings from Last 3 Encounters:  03/12/23 138/80  01/26/23 (!) 142/82  07/28/22 136/78     No Known Allergies  Medications Reviewed Today     Reviewed by Henrene Pastor, RPH-CPP (Pharmacist) on 05/02/23 at 1349  Med List Status: <None>   Medication Order Taking? Sig Documenting Provider Last Dose Status Informant  albuterol (VENTOLIN HFA) 108 (90 Base) MCG/ACT inhaler [id number] Yes Inhale 2 puffs into the lungs every 6 (six) hours as needed for wheezing or shortness of breath. Wanda Plump, MD Taking Active   Cholecalciferol (VITAMIN D3) 50 MCG (2000 UT) TABS [id number] Yes Take 1 tablet by mouth daily. [provider] Taking Active   furosemide (LASIX) 20 MG tablet [id number] Yes Take 1  tablet (20 mg total) by mouth daily. Due for CPX Wanda Plump, MD Taking Active   Glycopyrrolate-Formoterol (BEVESPI AEROSPHERE) 9-4.8 MCG/ACT Sandrea Matte [id number] Yes Inhale 2 puffs into the lungs in the morning and at bedtime. Wanda Plump, MD Taking Active   ipratropium (ATROVENT) 0.02 % nebulizer solution [id number] Yes USE 1 VIAL VIA NEBULIZER 4 TIMES DAILY Wanda Plump, MD Taking Active   Iron-Vitamins [place]) [place] [id number] Yes Take 1 Dose by mouth daily. [provider] Taking Active   Magnesium 250 MG TABS [id number] Yes Take 1 tablet by mouth daily. [provider] Taking Active   Multiple Vitamins-Minerals (ONE-A-DAY MENS VITACRAVES PO) [id number] Yes Chew 1 tablet by mouth daily. [provider] Taking Active Self  omeprazole (PRILOSEC) 20 MG capsule [id number] Yes Take 1 capsule (20 mg total) by mouth daily. Due for CPX Wanda Plump, MD Taking Active   potassium chloride SA (KLOR-CON M) 20 MEQ tablet [id number] Yes TAKE 1 TABLET BY MOUTH DAILY  Patient taking differently: Take 0.5 tablets by mouth daily.   Wanda Plump, MD Taking Active   propranolol (INDERAL) 20 MG tablet [id number] Yes Take 1 tablet (20 mg total) by mouth 2 (two) times daily. Due for cpx Wanda Plump, MD Taking Active   spironolactone (ALDACTONE) 50 MG tablet [id number] Yes Take 1 tablet (50 mg total) by mouth daily. Wanda Plump, MD Taking Active   vitamin C (ASCORBIC ACID)  250 MG tablet [id number]  Take 250 mg by mouth daily. [provider]  Active Self  Vitamin Mixture (VITAMIN E COMPLETE PO) [id number]  Take 1 capsule by mouth daily. [provider]  Active             Patient Active Problem List   Diagnosis Date Noted   Abdominal ascites 12/29/2021   Esophageal varices (HCC) 12/29/2021   Annual physical exam 08/12/2021   Insomnia 09/29/2020   COPD (chronic obstructive pulmonary disease) (HCC) 04/09/2019   PCP NOTES >>>>>>>>>>>>>>>>>> 02/05/2019   Chronic hepatitis (HCC)  02/05/2019   EtOH dependence (HCC) 01/25/2019   Tobacco dependence 01/25/2019   Cirrhosis of liver (HCC) 01/25/2019   Hyponatremia 01/25/2019   Iron deficiency anemia    Anxiety 08/20/2007   G E R D 08/20/2007     Medication Assistance:   Bevespi obtained through [place] medication assistance program.  Enrollment ends 07/03/2023   Assessment:   Medication management COPD   Plan: Mailed patient 2025 application for [place] (unable to complete online because patient does not have email) Re enrollment will be for Bevespi.    Continue Bevespi inhaler 2 puffs twice a day  Continue albuterol as needed.  Follow up as planned with Dr Marcellina Millin in December 2024.    Health Maintenance  Patient would like to scheduled colonoscopy but due to transportation difficulties he requests an office in [facility]. Provided list of GI / endosocpy / colonoscopy centers in [facility].  Reminded him that it looks like last colonoscopy was 01/2019 and next is due 01/2024  Follow Up:  Telephone follow up appointment with care management team member scheduled for:  2 months to check on 2025 medication assistance program for Marlin Canary, PharmD Clinical Pharmacist [facility]  - [facility] [phone]

## 2023-05-27 ENCOUNTER — Other Ambulatory Visit: Payer: Self-pay | Admitting: Internal Medicine

## 2023-06-08 ENCOUNTER — Ambulatory Visit (HOSPITAL_BASED_OUTPATIENT_CLINIC_OR_DEPARTMENT_OTHER): Admission: RE | Admit: 2023-06-08 | Payer: Medicare HMO | Source: Ambulatory Visit

## 2023-06-13 ENCOUNTER — Telehealth: Payer: Self-pay | Admitting: Pulmonary Disease

## 2023-06-13 NOTE — Telephone Encounter (Signed)
Please call to resched CT. He went to wrong location last time.  He is having car issues and can take an Benedetto Goad to the [facility] on [facility]?)  His # is [phone]

## 2023-06-15 ENCOUNTER — Ambulatory Visit (HOSPITAL_BASED_OUTPATIENT_CLINIC_OR_DEPARTMENT_OTHER)
Admission: RE | Admit: 2023-06-15 | Discharge: 2023-06-15 | Disposition: A | Discharge status: Home / Self Care | Payer: Medicare HMO | Source: Ambulatory Visit | Attending: Pulmonary Disease | Admitting: Pulmonary Disease

## 2023-06-15 DIAGNOSIS — R911 Solitary pulmonary nodule: Secondary | ICD-10-CM | POA: Diagnosis not present

## 2023-06-15 DIAGNOSIS — R160 Hepatomegaly, not elsewhere classified: Secondary | ICD-10-CM | POA: Diagnosis not present

## 2023-06-15 DIAGNOSIS — R079 Chest pain, unspecified: Secondary | ICD-10-CM | POA: Diagnosis not present

## 2023-06-15 DIAGNOSIS — R918 Other nonspecific abnormal finding of lung field: Secondary | ICD-10-CM | POA: Diagnosis not present

## 2023-06-15 DIAGNOSIS — I700 Atherosclerosis of aorta: Secondary | ICD-10-CM | POA: Diagnosis not present

## 2023-06-20 ENCOUNTER — Ambulatory Visit: Payer: Medicare HMO | Admitting: Pulmonary Disease

## 2023-06-21 ENCOUNTER — Other Ambulatory Visit: Payer: Self-pay | Admitting: Internal Medicine

## 2023-07-01 ENCOUNTER — Other Ambulatory Visit: Payer: Self-pay | Admitting: Internal Medicine

## 2023-07-05 ENCOUNTER — Other Ambulatory Visit: Payer: Self-pay | Admitting: Internal Medicine

## 2023-07-09 ENCOUNTER — Telehealth: Payer: Self-pay | Admitting: Pharmacist

## 2023-07-09 NOTE — Progress Notes (Signed)
 This encounter was created in error - please disregard.

## 2023-07-09 NOTE — Telephone Encounter (Signed)
 Attempt was made to contact patient by phone today for follow up by Clinical Pharmacist regarding 2025 medication assistance program for Bevespi.  Unable to reach patient. LM on VM with my contact number [phone] or [phone].

## 2023-07-12 ENCOUNTER — Other Ambulatory Visit: Payer: Self-pay | Admitting: Internal Medicine

## 2023-07-19 ENCOUNTER — Other Ambulatory Visit: Payer: Self-pay | Admitting: Internal Medicine

## 2023-07-27 ENCOUNTER — Telehealth: Payer: Self-pay

## 2023-07-27 ENCOUNTER — Other Ambulatory Visit: Payer: Self-pay | Admitting: Internal Medicine

## 2023-07-27 MED ORDER — ALBUTEROL SULFATE HFA 108 (90 BASE) MCG/ACT IN AERS: 2.0000 | INHALATION_SPRAY | Freq: Four times a day (QID) | RESPIRATORY_TRACT | 0 refills | Status: DC | PRN

## 2023-07-27 MED ORDER — PROPRANOLOL HCL 20 MG PO TABS: 20.0000 mg | ORAL_TABLET | Freq: Two times a day (BID) | ORAL | 0 refills | Status: DC

## 2023-07-27 MED ORDER — OMEPRAZOLE 20 MG PO CPDR: 20.0000 mg | DELAYED_RELEASE_CAPSULE | Freq: Every day | ORAL | 0 refills | Status: DC

## 2023-07-27 MED ORDER — SPIRONOLACTONE 50 MG PO TABS: 50.0000 mg | ORAL_TABLET | Freq: Every day | ORAL | 0 refills | Status: DC

## 2023-07-27 NOTE — Telephone Encounter (Signed)
15 day supply sent until his visit on 08/03/23

## 2023-07-27 NOTE — Telephone Encounter (Signed)
Copied from CRM [id number]. Topic: Clinical - Medication Refill >> Jul 27, 2023 12:59 PM Joanette Gula wrote: Most Recent Primary Care Visit:  Provider: Henrene Pastor  Department: [facility]  Visit Type: TELEPHONE OFFICE VISIT  Date: 05/02/2023  Medication: omeprazole (PRILOSEC) 20 MG capsule ...  propranolol (INDERAL) 20 MG tablet... furosemide (LASIX) 20 MG tablet... spironolactone (ALDACTONE) 50 MG tablet...  albuterol (VENTOLIN HFA) 108 (90 Base) MCG/ACT inhaler .  Has the patient contacted their pharmacy? Yes (Agent: If no, request that the patient contact the pharmacy for the refill. If patient does not wish to contact the pharmacy document the reason why and proceed with request.) (Agent: If yes, when and what did the pharmacy advise?)  Is this the correct pharmacy for this prescription? Yes If no, delete pharmacy and type the correct one.  This is the patient's preferred pharmacy:   [id number] - [place], [place] - [address] AT [address] [place] [address] Phone: [phone] Fax: [phone]   Has the prescription been filled recently? Yes  Is the patient out of the medication? Yes  Has the patient been seen for an appointment in the last year OR does the patient have an upcoming appointment? Yes  Can we respond through MyChart? No  Agent: Please be advised that Rx refills may take up to 3 business days. We ask that you follow-up with your pharmacy.

## 2023-08-02 ENCOUNTER — Telehealth: Payer: Self-pay | Admitting: Internal Medicine

## 2023-08-02 NOTE — Telephone Encounter (Signed)
LVM to r/s appt provider not feeling good, will not be in office 01-31. Pt can be scheduled with another provider from office if pt ok with it.

## 2023-08-03 ENCOUNTER — Other Ambulatory Visit: Payer: Self-pay | Admitting: Internal Medicine

## 2023-08-03 ENCOUNTER — Ambulatory Visit: Payer: Medicare (Managed Care) | Admitting: Internal Medicine

## 2023-08-07 ENCOUNTER — Other Ambulatory Visit: Payer: Self-pay | Admitting: Internal Medicine

## 2023-08-08 ENCOUNTER — Other Ambulatory Visit: Payer: Self-pay | Admitting: Internal Medicine

## 2023-08-10 ENCOUNTER — Telehealth: Payer: Self-pay

## 2023-08-10 ENCOUNTER — Telehealth (INDEPENDENT_AMBULATORY_CARE_PROVIDER_SITE_OTHER): Payer: Medicare (Managed Care) | Admitting: Internal Medicine

## 2023-08-10 ENCOUNTER — Encounter: Payer: Self-pay | Admitting: Internal Medicine

## 2023-08-10 VITALS — Ht 73.0 in | Wt 216.0 lb

## 2023-08-10 DIAGNOSIS — K7030 Alcoholic cirrhosis of liver without ascites: Secondary | ICD-10-CM

## 2023-08-10 DIAGNOSIS — Z91199 Patient's noncompliance with other medical treatment and regimen due to unspecified reason: Secondary | ICD-10-CM | POA: Diagnosis not present

## 2023-08-10 DIAGNOSIS — J441 Chronic obstructive pulmonary disease with (acute) exacerbation: Secondary | ICD-10-CM | POA: Diagnosis not present

## 2023-08-10 DIAGNOSIS — R911 Solitary pulmonary nodule: Secondary | ICD-10-CM

## 2023-08-10 MED ORDER — SPIRONOLACTONE 50 MG PO TABS: 50.0000 mg | ORAL_TABLET | Freq: Every day | ORAL | 0 refills | Status: DC

## 2023-08-10 MED ORDER — OMEPRAZOLE 20 MG PO CPDR: 20.0000 mg | DELAYED_RELEASE_CAPSULE | Freq: Every day | ORAL | 0 refills | Status: DC

## 2023-08-10 MED ORDER — ALBUTEROL SULFATE HFA 108 (90 BASE) MCG/ACT IN AERS: 2.0000 | INHALATION_SPRAY | Freq: Four times a day (QID) | RESPIRATORY_TRACT | 5 refills | Status: DC | PRN

## 2023-08-10 MED ORDER — PROPRANOLOL HCL 20 MG PO TABS: 20.0000 mg | ORAL_TABLET | Freq: Two times a day (BID) | ORAL | 0 refills | Status: DC

## 2023-08-10 MED ORDER — POTASSIUM CHLORIDE CRYS ER 20 MEQ PO TBCR: 20.0000 meq | EXTENDED_RELEASE_TABLET | Freq: Every day | ORAL | 0 refills | Status: DC

## 2023-08-10 MED ORDER — FUROSEMIDE 20 MG PO TABS: 20.0000 mg | ORAL_TABLET | Freq: Every day | ORAL | 0 refills | Status: DC

## 2023-08-10 NOTE — Telephone Encounter (Signed)
 Tried calling Pt to schedule lab appt for next week, a CPE in 3 months and AWV appt, no answer, unable to leave message.

## 2023-08-10 NOTE — Progress Notes (Signed)
 Subjective:    Patient ID: William Whitaker, male    DOB: Aug 30, 1960, 63 y.o.   MRN: [id number]  DOS:  08/10/2023 Type of visit - description: Virtual Visit via Video Note  I connected with the above patient  by a video enabled telemedicine application and verified that I am speaking with the correct person using two identifiers.   Location of patient: home  Location of provider: office  Persons participating in the virtual visit: patient, provider   I discussed the limitations of evaluation and management by telemedicine and the availability of in person appointments. The patient expressed understanding and agreed to proceed.  Follow-up. Since the last office visit things are about the same. Ran out of propranolol  3 days ago. Developed a cold few days ago, chronic cough increases, sputum increased, white to clear in color.  No hemoptysis. No fever or chills. No chest pain.  History of cirrhosis, denies nausea vomiting. No leg, no abdominal swelling.  Review of Systems See above   Past Medical History:  Diagnosis Date   Anxiety    Appendicitis    Bronchitis    GERD (gastroesophageal reflux disease)    Insomnia    Recurrent incisional hernia     Past Surgical History:  Procedure Laterality Date   APPENDECTOMY  01/12/2021   open   BIOPSY  01/27/2019   Procedure: BIOPSY;  Surgeon: William Whitaker., MD;  Location: [facility];  Service: Gastroenterology;;   COLONOSCOPY WITH PROPOFOL  N/A 01/27/2019   Procedure: COLONOSCOPY WITH PROPOFOL ;  Surgeon: William Whitaker., MD;  Location: [facility];  Service: Gastroenterology;  Laterality: N/A;   ESOPHAGOGASTRODUODENOSCOPY (EGD) WITH PROPOFOL  N/A 01/27/2019   Procedure: ESOPHAGOGASTRODUODENOSCOPY (EGD) WITH PROPOFOL ;  Surgeon: William Whitaker., MD;  [facility];  Service: Gastroenterology;  Laterality: N/A;   HERNIA REPAIR Right remotely as a baby   INCISIONAL HERNIA REPAIR  01/02/2022   open    POLYPECTOMY  01/27/2019   Procedure: POLYPECTOMY;  Surgeon: Mansouraty, Aloha Whitaker., MD;  Location: [facility];  Service: Gastroenterology;;    Current Outpatient Medications  Medication Instructions   albuterol  (VENTOLIN  HFA) 108 (90 Base) MCG/ACT inhaler 2 puffs, Inhalation, Every 6 hours PRN   Cholecalciferol (VITAMIN D3) 50 MCG (2000 UT) TABS 1 tablet, Daily   furosemide  (LASIX ) 20 mg, Oral, Daily   Glycopyrrolate -Formoterol (BEVESPI  AEROSPHERE) 9-4.8 MCG/ACT AERO 2 puffs, 2 times daily   ipratropium (ATROVENT ) 0.02 % nebulizer solution USE 1 VIAL VIA NEBULIZER 4 TIMES DAILY   Iron-Vitamins (GERITOL) LIQD 1 Dose, Daily   Magnesium  250 MG TABS 1 tablet, Daily   Multiple Vitamins-Minerals (ONE-A-DAY MENS VITACRAVES PO) 1 tablet, Daily   omeprazole  (PRILOSEC) 20 mg, Oral, Daily   potassium chloride  SA (KLOR-CON  M) 20 MEQ tablet TAKE 1 TABLET BY MOUTH DAILY   propranolol  (INDERAL ) 20 mg, Oral, 2 times daily   spironolactone  (ALDACTONE ) 50 mg, Oral, Daily   vitamin C (ASCORBIC ACID) 250 mg, Daily   Vitamin Mixture (VITAMIN E COMPLETE PO) 1 capsule, Daily       Objective:   Physical Exam Ht 6' 1 (1.854 m)   Wt 216 lb (98 kg)   BMI 28.50 kg/m  This is a virtual video visit, alert oriented x 3, in no distress.  Some cough noted with large airway congestion.    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 EtOH abuse (at some point to Topamax , consider restart) Hep C treated and quit alcohol. Tobacco abuse Anemia, severe. Perforated appendix 12-2020   PLAN Virtual visit today, declined to come in person but states that he will come back for blood work. COPD: Having a exacerbation, recommend conservative treatment and office visit if he is not improving in the next few days. Liver cirrhosis: With no nausea, swelling.  Refill  propranolol .To my knowledge, he has not seen GI.  Checking labs EtOH: Reports he is drinking very rarely Tobacco abuse: Using nicotine supplements, reports he is not smoking. Pulmonary nodule:Saw pulmonary 03/12/2023, CT chest December 2024: Lingular nodule resolved. Remaining pulmonary nodules stable. Coronary calcifications noted,  see full report. Plan: Will call patient and set up labs ( CMP CBC PT PTT) and a follow-up in person in 3 months Multiple  refills sent.    I discussed the assessment and treatment plan with the patient. The patient was provided an opportunity to ask questions and all were answered. The patient agreed with the plan and demonstrated an understanding of the instructions.   The patient was advised to call back or seek an in-person evaluation if the symptoms worsen or if the condition fails to improve as anticipated.

## 2023-08-11 NOTE — Assessment & Plan Note (Signed)
 Virtual visit today, declined to come in person but states that he will come back for blood work. COPD: Having a exacerbation, recommend conservative treatment and office visit if he is not improving in the next few days. Liver cirrhosis: With no nausea, swelling.  Refill propranolol .To my knowledge, he has not seen GI.  Checking labs EtOH: Reports he is drinking very rarely Tobacco abuse: Using nicotine supplements, reports he is not smoking. Pulmonary nodule:Saw pulmonary 03/12/2023, CT chest December 2024: Lingular nodule resolved. Remaining pulmonary nodules stable. Coronary calcifications noted,  see full report. Plan: Will call patient and set up labs ( CMP CBC PT PTT) and a follow-up in person in 3 months Multiple  refills sent.

## 2023-08-13 ENCOUNTER — Ambulatory Visit: Payer: Self-pay | Admitting: Internal Medicine
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
## 2023-08-13 NOTE — Telephone Encounter (Signed)
 Recommend to be seen.  Unable to send medications.

## 2023-08-13 NOTE — Telephone Encounter (Signed)
  Chief Complaint: Cough-Productive Symptoms: Congestion, Cough, Wheezing Frequency: Acute Pertinent Negatives: Patient denies Shortness of Breath, Disposition: [] ED /[] Urgent Care (no appt availability in office) / [] Appointment(In office/virtual)/ []  [facility]/ [] Home Care/ [x] Refused Recommended Disposition /[] [facility]/ []  Follow-up with PCP Additional Notes: Patient contacted via phone regarding concerns of an acute productive cough, congestion, wheezing, Discussed symptoms, severity, and duration. Based on assessment, patient was advised to see PCP in office tomorrow. Patient refused initial disposition due to being told something could be called in for him, and wanting to avoid contact. Offered a virtual appointment, and patient requested to see if Dr. Neomi Banks could call him something in, if not, to reach out and let him know what the next steps are.      Reason for Disposition  [place]  Answer Assessment - Initial Assessment Questions 1. ONSET: "When did the cough begin?"      Acute, 1-2 Days  2. SEVERITY: "How bad is the cough today?"      Consistent  3. SPUTUM: "Describe the color of your sputum" (none, dry cough; clear, white, yellow, green)     Clear, Milky White  4. HEMOPTYSIS: "Are you coughing up any blood?" If so ask: "How much?" (flecks, streaks, tablespoons, etc.)     No  5. DIFFICULTY BREATHING: "Are you having difficulty breathing?" If Yes, ask: "How bad is it?" (e.g., mild, moderate, severe)    - MILD: No SOB at rest, mild SOB with walking, speaks normally in sentences, can lie down, no retractions, pulse < 100.    - MODERATE: SOB at rest, SOB with minimal exertion and prefers to sit, cannot lie down flat, speaks in phrases, mild retractions, audible wheezing, pulse 100-120.    - SEVERE: Very SOB at rest, speaks in single words, struggling to breathe, sitting hunched forward, retractions, pulse > 120      No  6. FEVER: "Do you have a fever?"  If Yes, ask: "What is your temperature, how was it measured, and when did it start?"     No  7. CARDIAC HISTORY: "Do you have any history of heart disease?" (e.g., heart attack, congestive heart failure)      No  8. LUNG HISTORY: "Do you have any history of lung disease?"  (e.g., pulmonary embolus, asthma, emphysema)     COPD  9. PE RISK FACTORS: "Do you have a history of blood clots?" (or: recent major surgery, recent prolonged travel, bedridden)     No  10. OTHER SYMPTOMS: "Do you have any other symptoms?" (e.g., runny nose, wheezing, chest pain)       Wheezing, Chest Pain, Soreness, Wheezing  12. TRAVEL: "Have you traveled out of the country in the last month?" (e.g., travel history, exposures)       No  Protocols used: Cough - Acute Productive-A-AH

## 2023-11-05 ENCOUNTER — Encounter: Payer: Self-pay | Admitting: Internal Medicine

## 2023-11-05 ENCOUNTER — Other Ambulatory Visit: Payer: Self-pay | Admitting: Internal Medicine

## 2023-11-27 ENCOUNTER — Other Ambulatory Visit: Payer: Self-pay | Admitting: Internal Medicine

## 2023-12-01 ENCOUNTER — Other Ambulatory Visit: Payer: Self-pay | Admitting: Internal Medicine

## 2023-12-04 ENCOUNTER — Ambulatory Visit: Payer: Medicare (Managed Care) | Admitting: Internal Medicine

## 2023-12-10 ENCOUNTER — Ambulatory Visit: Payer: Medicare (Managed Care) | Admitting: Internal Medicine

## 2023-12-14 ENCOUNTER — Other Ambulatory Visit: Payer: Self-pay | Admitting: Internal Medicine

## 2023-12-14 ENCOUNTER — Encounter: Payer: Self-pay | Admitting: Internal Medicine

## 2023-12-14 ENCOUNTER — Ambulatory Visit (INDEPENDENT_AMBULATORY_CARE_PROVIDER_SITE_OTHER): Payer: Medicare (Managed Care) | Admitting: Internal Medicine

## 2023-12-14 VITALS — BP 142/82 | HR 77 | Temp 98.5°F | Resp 18 | Ht 73.0 in | Wt 228.2 lb

## 2023-12-14 DIAGNOSIS — R739 Hyperglycemia, unspecified: Secondary | ICD-10-CM | POA: Diagnosis not present

## 2023-12-14 DIAGNOSIS — K7030 Alcoholic cirrhosis of liver without ascites: Secondary | ICD-10-CM

## 2023-12-14 DIAGNOSIS — Z0001 Encounter for general adult medical examination with abnormal findings: Secondary | ICD-10-CM

## 2023-12-14 DIAGNOSIS — R030 Elevated blood-pressure reading, without diagnosis of hypertension: Secondary | ICD-10-CM | POA: Diagnosis not present

## 2023-12-14 DIAGNOSIS — Z0000 Encounter for general adult medical examination without abnormal findings: Secondary | ICD-10-CM | POA: Diagnosis not present

## 2023-12-14 DIAGNOSIS — Z125 Encounter for screening for malignant neoplasm of prostate: Secondary | ICD-10-CM | POA: Diagnosis not present

## 2023-12-14 DIAGNOSIS — Z1211 Encounter for screening for malignant neoplasm of colon: Secondary | ICD-10-CM

## 2023-12-14 DIAGNOSIS — Z13220 Encounter for screening for lipoid disorders: Secondary | ICD-10-CM | POA: Diagnosis not present

## 2023-12-14 MED ORDER — FUROSEMIDE 20 MG PO TABS: 20.0000 mg | ORAL_TABLET | Freq: Every day | ORAL | 0 refills | Status: DC

## 2023-12-14 MED ORDER — BEVESPI AEROSPHERE 9-4.8 MCG/ACT IN AERO: 2.0000 | INHALATION_SPRAY | Freq: Two times a day (BID) | RESPIRATORY_TRACT | 5 refills | Status: DC

## 2023-12-14 MED ORDER — BLOOD PRESSURE KIT: PACK | 0 refills | Status: AC

## 2023-12-14 MED ORDER — POTASSIUM CHLORIDE CRYS ER 20 MEQ PO TBCR: 20.0000 meq | EXTENDED_RELEASE_TABLET | Freq: Every day | ORAL | 0 refills | Status: DC

## 2023-12-14 MED ORDER — SPIRONOLACTONE 50 MG PO TABS: 50.0000 mg | ORAL_TABLET | Freq: Every day | ORAL | 0 refills | Status: DC

## 2023-12-14 MED ORDER — PROPRANOLOL HCL 20 MG PO TABS: 20.0000 mg | ORAL_TABLET | Freq: Two times a day (BID) | ORAL | 0 refills | Status: DC

## 2023-12-14 MED ORDER — OMEPRAZOLE 20 MG PO CPDR
20.0000 mg | DELAYED_RELEASE_CAPSULE | Freq: Every day | ORAL | 0 refills | Status: DC
Start: 2023-12-14 — End: 2023-12-31

## 2023-12-14 NOTE — Progress Notes (Unsigned)
 Subjective:    Patient ID: William Whitaker, male    DOB: August 07, 1960, 63 y.o.   MRN: [id number]  DOS:  12/14/2023 Type of visit - description: Here for CPX  Chronic medical problems addressed. Denies chest pain, cough. No nausea or vomiting, no diarrhea.  No blood in the stools. No LUTS.  BP Readings from Last 3 Encounters:  12/14/23 (!) 142/82  03/12/23 138/80  01/26/23 (!) 142/82   Review of Systems  Other than above, a 14 point review of systems is negative      Past Medical History:  Diagnosis Date   Anxiety    Appendicitis    Bronchitis    GERD (gastroesophageal reflux disease)    Insomnia    Recurrent incisional hernia     Past Surgical History:  Procedure Laterality Date   APPENDECTOMY  01/12/2021   open   BIOPSY  01/27/2019   Procedure: BIOPSY;  Surgeon: Normie Becton., MD;  Location: [facility];  Service: Gastroenterology;;   COLONOSCOPY WITH PROPOFOL  N/A 01/27/2019   Procedure: COLONOSCOPY WITH PROPOFOL ;  Surgeon: Normie Becton., MD;  Location: [facility];  Service: Gastroenterology;  Laterality: N/A;   ESOPHAGOGASTRODUODENOSCOPY (EGD) WITH PROPOFOL  N/A 01/27/2019   Procedure: ESOPHAGOGASTRODUODENOSCOPY (EGD) WITH PROPOFOL ;  Surgeon: Brice Campi Albino Alu., MD;  [facility];  Service: Gastroenterology;  Laterality: N/A;   HERNIA REPAIR Right remotely as a baby   INCISIONAL HERNIA REPAIR  01/02/2022   open   POLYPECTOMY  01/27/2019   Procedure: POLYPECTOMY;  Surgeon: Mansouraty, Albino Alu., MD;  Location: [facility];  Service: Gastroenterology;;   Social History   Social History Narrative   Lives w/ his sister     Current Outpatient Medications  Medication Instructions   albuterol  (VENTOLIN  HFA) 108 (90 Base) MCG/ACT inhaler 2 puffs, Inhalation, Every 6 hours PRN   Blood Pressure KIT Check blood pressures once daily   Cholecalciferol (VITAMIN D3) 50 MCG (2000 UT) TABS 1 tablet, Daily   furosemide  (LASIX ) 20 mg,  Oral, Daily   ipratropium (ATROVENT ) 0.02 % nebulizer solution USE 1 VIAL VIA NEBULIZER 4 TIMES DAILY   Iron-Vitamins (GERITOL) LIQD 1 Dose, Daily   Magnesium  250 MG TABS 1 tablet, Daily   Multiple Vitamins-Minerals (ONE-A-DAY MENS VITACRAVES PO) 1 tablet, Daily   omeprazole  (PRILOSEC) 20 mg, Oral, Daily   potassium chloride  SA (KLOR-CON  M) 20 MEQ tablet 20 mEq, Oral, Daily   propranolol  (INDERAL ) 20 mg, Oral, 2 times daily   spironolactone  (ALDACTONE ) 50 mg, Oral, Daily   umeclidinium-vilanterol (ANORO ELLIPTA) 62.5-25 MCG/ACT AEPB 1 puff, Inhalation, Daily   vitamin C (ASCORBIC ACID) 250 mg, Daily   Vitamin Mixture (VITAMIN E COMPLETE PO) 1 capsule, Daily       Objective:   Physical Exam BP (!) 142/82   Pulse 77   Temp 98.5 F (36.9 C) (Oral)   Resp 18   Ht 6' 1 (1.854 m)   Wt 228 lb 4 oz (103.5 kg)   SpO2 95%   BMI 30.11 kg/m  General:   Well developed, NAD, BMI noted.  HEENT:  Normocephalic . Face symmetric, atraumatic Lungs:  CTA B Normal respiratory effort, no intercostal retractions, no accessory muscle use. Heart: RRR,  no murmur.  Abdomen:  Not distended, soft, non-tender. + Palpable liver under the R rib cage, seems nodular, nontender. Skin: Not pale. Not jaundice Lower extremities: no pretibial edema bilaterally  Neurologic:  alert & oriented X3.  Speech normal, gait appropriate for age and unassisted Psych--  Cognition and judgment appear intact.  Cooperative with normal attention span and concentration.  Behavior appropriate. No anxious or depressed appearing.     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 EtOH abuse (at some point to Topamax , consider restart) Hep C treated and quit alcohol. Tobacco abuse Anemia, severe. Perforated appendix 12-2020 Pulmonary nodules: CT 06/2023.  (Will proceed with  LDCT's, next 06/2024   PLAN Here for CPX Vaccines I recommend:  Tdap, Shingrix,   PNM 20, COVID booster if not done recently, flu shot every fall.  Declines any shots today CCS: Colonoscopy 01/27/2019, next 5 years per GI letter, declined referral to [facility] plans to see another group due to location.  [facility] Prostate cancer screening: No FH, no symptoms, check PSA Lung Ca screening: Smoking history: Started at age ~ 63 y/o 50, ~ 1ppd, quit smoking 2023 Next LDCT 06/2024  Labs: CMP FLP CBC PT PTT A1c PSA   Other issues:  Liver cirrhosis: Reports that he is not drinking at this point.  Has not seen GI in a while, no recent liver imaging. Encouraged to see GI as he is planning.  Check a CBC, PT PTT.  Get RUQ ultrasound. Reports he drinks very little from time to time. Tobacco abuse: Reports he has quit. COPD: Uses Bevespi , hardly ever needs a rescue inhaler. Elevated BP: On Inderal , Lasix , Aldactone , potassium mostly for liver disease.  BP today 144/90.  He has no way to check his BPs at home. Plan: Nurse visit in 1 month for BP check.  Reassess on RTC RTC 4 months

## 2023-12-14 NOTE — Telephone Encounter (Signed)
 Okay to switch to Anoro Ellipta 1 puff daily

## 2023-12-14 NOTE — Telephone Encounter (Signed)
 Bevespi  Aerosphere not covered by plan. Okay to change to Anoro Ellipta?

## 2023-12-14 NOTE — Patient Instructions (Addendum)
 Vaccines are recommended Tetanus shot Shingles shot Pneumonia shot (PNM 20) COVID booster Flu shot every fall.  Please talk with the gastroenterologist as you are planning. In the meantime, I will order an ultrasound of your liver.     GO TO THE LAB :  Get the blood work   Your results will be posted on MyChart with my comments   Go to the front desk and arrange for the following. A nurse visit in 1 month to check your blood pressure An appointment to see me in 4 months.

## 2023-12-15 ENCOUNTER — Encounter: Payer: Self-pay | Admitting: Internal Medicine

## 2023-12-15 LAB — LIPID PANEL
Cholesterol: 147 mg/dL (ref <200)
HDL: 44 mg/dL (ref >=40)
LDL Cholesterol (Calc): 73 mg/dL
Non-HDL Cholesterol (Calc): 103 mg/dL (ref <130)
Total CHOL/HDL Ratio: 3.3 (calc) (ref <5.0)
Triglycerides: 198 mg/dL — ABNORMAL HIGH (ref <150)

## 2023-12-15 LAB — COMPREHENSIVE METABOLIC PANEL WITH GFR
AG Ratio: 1.2 (calc) (ref 1.0–2.5)
ALT: 15 U/L (ref 9–46)
AST: 43 U/L — ABNORMAL HIGH (ref 10–35)
Albumin: 4.1 g/dL (ref 3.6–5.1)
Alkaline phosphatase (APISO): 91 U/L (ref 35–144)
BUN/Creatinine Ratio: 11 (calc) (ref 6–22)
BUN: 7 mg/dL (ref 7–25)
CO2: 26 mmol/L (ref 20–32)
Calcium: 9 mg/dL (ref 8.6–10.3)
Chloride: 96 mmol/L — ABNORMAL LOW (ref 98–110)
Creat: 0.64 mg/dL — ABNORMAL LOW (ref 0.70–1.35)
Globulin: 3.3 g/dL (ref 1.9–3.7)
Glucose, Bld: 107 mg/dL — ABNORMAL HIGH (ref 65–99)
Potassium: 4.4 mmol/L (ref 3.5–5.3)
Sodium: 133 mmol/L — ABNORMAL LOW (ref 135–146)
Total Bilirubin: 1.6 mg/dL — ABNORMAL HIGH (ref 0.2–1.2)
Total Protein: 7.4 g/dL (ref 6.1–8.1)
eGFR: 106 mL/min/{1.73_m2} (ref >=60)

## 2023-12-15 LAB — CBC WITH DIFFERENTIAL/PLATELET
Absolute Lymphocytes: 1271 {cells}/uL (ref 850–3900)
Absolute Monocytes: 741 {cells}/uL (ref 200–950)
Basophils Absolute: 29 {cells}/uL (ref 0–200)
Basophils Relative: 0.5 %
Eosinophils Absolute: 131 {cells}/uL (ref 15–500)
Eosinophils Relative: 2.3 %
HCT: 43.8 % (ref 38.5–50.0)
Hemoglobin: 15.3 g/dL (ref 13.2–17.1)
MCH: 34.1 pg — ABNORMAL HIGH (ref 27.0–33.0)
MCHC: 34.9 g/dL (ref 32.0–36.0)
MCV: 97.6 fL (ref 80.0–100.0)
MPV: 10.6 fL (ref 7.5–12.5)
Monocytes Relative: 13 %
Neutro Abs: 3528 {cells}/uL (ref 1500–7800)
Neutrophils Relative %: 61.9 %
Platelets: 110 10*3/uL — ABNORMAL LOW (ref 140–400)
RBC: 4.49 10*6/uL (ref 4.20–5.80)
RDW: 13.4 % (ref 11.0–15.0)
Total Lymphocyte: 22.3 %
WBC: 5.7 10*3/uL (ref 3.8–10.8)

## 2023-12-15 LAB — PROTIME-INR
INR: 1.1
Prothrombin Time: 11.9 s — ABNORMAL HIGH (ref 9.0–11.5)

## 2023-12-15 LAB — PSA: PSA: 0.18 ng/mL (ref <=4.00)

## 2023-12-15 LAB — HEMOGLOBIN A1C
Hgb A1c MFr Bld: 5.5 % (ref <5.7)
Mean Plasma Glucose: 111 mg/dL
eAG (mmol/L): 6.2 mmol/L

## 2023-12-15 LAB — APTT: aPTT: 30 s (ref 23–32)

## 2023-12-15 NOTE — Assessment & Plan Note (Signed)
 Here for CPX Vaccines I recommend:  Tdap, Shingrix,   PNM 20, COVID booster if not done recently, flu shot every fall.  Declines any shots today CCS: Colonoscopy 01/27/2019, next 5 years per GI letter, declined referral to [facility] plans to see another group due to location.  [facility] Prostate cancer screening: No FH, no symptoms, check PSA Lung Ca screening: Smoking history: Started at age ~ 63 y/o 54, ~ 1ppd, quit smoking 2023 Next LDCT 06/2024  Labs: CMP FLP CBC PT PTT A1c PSA

## 2023-12-15 NOTE — Assessment & Plan Note (Signed)
 Here for CPX  Other issues:  Liver cirrhosis: Reports that he is not drinking at this point.  Has not seen GI in a while, no recent liver imaging. Encouraged to see GI as he is planning.  Check a CBC, PT PTT.  Get RUQ ultrasound. Reports he drinks very little from time to time. Tobacco abuse: Reports he has quit. COPD: Uses Bevespi , hardly ever needs a rescue inhaler. Elevated BP: On Inderal , Lasix , Aldactone , potassium mostly for liver disease.  BP today 144/90.  He has no way to check his BPs at home. Plan: Nurse visit in 1 month for BP check.  Reassess on RTC RTC 4 months

## 2023-12-17 ENCOUNTER — Ambulatory Visit: Payer: Self-pay | Admitting: Internal Medicine

## 2023-12-20 ENCOUNTER — Other Ambulatory Visit (HOSPITAL_COMMUNITY): Payer: Self-pay

## 2023-12-20 ENCOUNTER — Telehealth: Payer: Self-pay

## 2023-12-20 NOTE — Telephone Encounter (Signed)
 Pharmacy Patient Advocate Encounter   Received notification from RX Request Messages that prior authorization for Anoro Ellipta 62.5-25mcg is required/requested.   Insurance verification completed.   The patient is insured through [employer].   Per test claim: PA required; PA submitted to above mentioned insurance via CoverMyMeds Key/confirmation #/[id number] Status is pending

## 2023-12-20 NOTE — Telephone Encounter (Signed)
 PA request has been Submitted. New Encounter has been or will be created for follow up. For additional info see Pharmacy Prior Auth telephone encounter from 12/20/23.

## 2023-12-21 ENCOUNTER — Ambulatory Visit (HOSPITAL_BASED_OUTPATIENT_CLINIC_OR_DEPARTMENT_OTHER): Payer: Medicare (Managed Care)

## 2023-12-21 ENCOUNTER — Other Ambulatory Visit (HOSPITAL_COMMUNITY): Payer: Self-pay

## 2023-12-21 NOTE — Telephone Encounter (Signed)
 Pharmacy Patient Advocate Encounter  Received notification from [employer] that Prior Authorization for umeclidinium-vilantero 62.5-25mcg has been DENIED.  Brand name Anoro Ellipta is the preferred medication on the formulary.    PA #/Case ID/Reference #: [id number]   Spoke with the [facility] and she was able to process the claim for the [facility] and the copay is $20. She said it is out of stock and will order to come in on Monday.

## 2023-12-27 ENCOUNTER — Ambulatory Visit (HOSPITAL_BASED_OUTPATIENT_CLINIC_OR_DEPARTMENT_OTHER)
Admission: RE | Admit: 2023-12-27 | Discharge: 2023-12-27 | Disposition: A | Discharge status: Home / Self Care | Payer: Medicare (Managed Care) | Source: Ambulatory Visit | Attending: Internal Medicine | Admitting: Internal Medicine

## 2023-12-27 DIAGNOSIS — K7030 Alcoholic cirrhosis of liver without ascites: Secondary | ICD-10-CM | POA: Insufficient documentation

## 2023-12-29 ENCOUNTER — Other Ambulatory Visit: Payer: Self-pay | Admitting: Internal Medicine

## 2024-01-14 ENCOUNTER — Ambulatory Visit (INDEPENDENT_AMBULATORY_CARE_PROVIDER_SITE_OTHER): Payer: Medicare (Managed Care)

## 2024-01-14 DIAGNOSIS — I10 Essential (primary) hypertension: Secondary | ICD-10-CM

## 2024-01-14 NOTE — Progress Notes (Signed)
 Pt here for Blood pressure check per Paz  Pt currently takes: Spironolactone  50 mg, once daily, Propranolol  20mg , BID   Pt reports compliance with medication.  BP today @ =130/80 HR =78  Pt advised per Dr. Amon, no changes at this time. Monitor blood pressure at home and keep appt for 04/15/24.

## 2024-01-24 ENCOUNTER — Other Ambulatory Visit: Payer: Self-pay | Admitting: Internal Medicine

## 2024-01-29 ENCOUNTER — Ambulatory Visit: Payer: Medicare (Managed Care)

## 2024-03-05 ENCOUNTER — Telehealth: Payer: Self-pay | Admitting: Internal Medicine

## 2024-03-05 NOTE — Telephone Encounter (Signed)
 Copied from CRM [id number]. Topic: Medicare AWV >> Mar 05, 2024 11:06 AM Nathanel DEL wrote: Reason for CRM: Called LVM 03/05/2024 to schedule AWV. Please schedule office or virtual visits.  Nathanel Paschal; Care Guide Ambulatory Clinical Support [place] [facility] Direct Dial: [phone]

## 2024-03-30 ENCOUNTER — Other Ambulatory Visit: Payer: Self-pay | Admitting: Internal Medicine

## 2024-04-03 ENCOUNTER — Telehealth: Payer: Self-pay

## 2024-04-03 NOTE — Telephone Encounter (Signed)
 LMOM asking for call back. We do not have a [place] in our office, and I don't see where anyone has called.

## 2024-04-03 NOTE — Telephone Encounter (Signed)
 Copied from CRM [id number]. Topic: General - Call Back - No Documentation >> Apr 03, 2024 12:38 PM William Whitaker wrote: Reason for CRM: patient returning call from [place] about his appt on the 14th.

## 2024-04-15 ENCOUNTER — Ambulatory Visit: Payer: Medicare (Managed Care) | Admitting: Internal Medicine

## 2024-06-16 ENCOUNTER — Other Ambulatory Visit: Payer: Medicare (Managed Care)

## 2024-06-16 DIAGNOSIS — J449 Chronic obstructive pulmonary disease, unspecified: Secondary | ICD-10-CM

## 2024-06-16 NOTE — Progress Notes (Unsigned)
 06/16/2024 Name: ISAMI MEHRA MRN: [id number] DOB: 03/01/1961  No chief complaint on file.   OSMANI KERSTEN is a 63 y.o. year old male who presented for a telephone visit.   They were referred to the pharmacist by their PCP for assistance in managing medication access.  Patient dropped off paperwork from [employer]. Paper work included applications for Bevespi  and Anoro inhalers.    Subjective:  Care Team: Primary Care Provider: Amon Aloysius BRAVO, MD ; Next Scheduled Visit: not currently scheduled.  Pulmonologist: Dr Terris - no appointment currently scheduled.   Medication Access/Adherence  [facility]acy:  [id number] [place], [place] - [address] AT [address]OSS [address] [place] [address] Phone: [phone] Fax: [phone]  [facility] [place] [place] - [address] [place] [address] Phone: [phone] Fax: [phone]   Patient reports affordability concerns with their medications: Yes  - patient is changing to [facility] in 2026. His son is an biomedical engineer and recommended [employer] to patient. Per Mr. Rudin he was charged $40 per month for Anora / Bevespi .  Thru the end of 2025 patient will only pay $20 for Anoro thru his 2025 [employer] plan. However Anoro is not on [employer] formulary for 2026.  Patient reports adherence concerns with their medications:  No      Objective:  Lab Results  Component Value Date   HGBA1C 5.5 12/14/2023    Lab Results  Component Value Date   CREATININE 0.64 (L) 12/14/2023   BUN 7 12/14/2023   NA 133 (L) 12/14/2023   K 4.4 12/14/2023   CL 96 (L) 12/14/2023   CO2 26 12/14/2023    Lab Results  Component Value Date   CHOL 147 12/14/2023   HDL 44 12/14/2023   LDLCALC 73 12/14/2023   TRIG 198 (H) 12/14/2023   CHOLHDL 3.3 12/14/2023    Medications Reviewed Today     Reviewed by Carla Milling, RPH-CPP (Pharmacist) on 06/16/24 at 1311  Med List  Status: <None>   Medication Order Taking? Sig Documenting Provider Last Dose Status Informant  albuterol  (VENTOLIN  HFA) 108 (90 Base) MCG/ACT inhaler [id number] Yes Inhale 2 puffs into the lungs every 6 (six) hours as needed for wheezing or shortness of breath. Amon Aloysius BRAVO, MD  Active   Blood Pressure KIT [id number]  Check blood pressures once daily Paz, Jose E, MD  Active   Cholecalciferol (VITAMIN D3) 50 MCG (2000 UT) TABS [id number]  Take 1 tablet by mouth daily. [provider]  Active   furosemide  (LASIX ) 20 MG tablet [id number] Yes Take 1 tablet (20 mg total) by mouth daily. Paz, Jose E, MD  Active   ipratropium (ATROVENT ) 0.02 % nebulizer solution [id number]  USE 1 VIAL VIA NEBULIZER 4 TIMES DAILY Paz, Jose E, MD  Active   Iron-Vitamins [place]) [place] [id number]  Take 1 Dose by mouth daily. [provider]  Active   Magnesium  250 MG TABS [id number]  Take 1 tablet by mouth daily. [provider]  Active   Multiple Vitamins-Minerals (ONE-A-DAY MENS VITACRAVES PO) [id number]  Chew 1 tablet by mouth daily. [provider]  Active Self  omeprazole  (PRILOSEC) 20 MG capsule [id number] Yes Take 1 capsule (20 mg total) by mouth daily. Paz, Jose E, MD  Active   potassium chloride  SA (KLOR-CON  M) 20 MEQ tablet [id number] Yes Take 1 tablet (20 mEq total) by mouth daily. [address] Jose E,  MD  Active   propranolol  (INDERAL ) 20 MG tablet [id number] Yes Take 1 tablet (20 mg total) by mouth 2 (two) times daily. Paz, Jose E, MD  Active   spironolactone  (ALDACTONE ) 50 MG tablet [id number] Yes Take 1 tablet (50 mg total) by mouth daily. Paz, Jose E, MD  Active   umeclidinium-vilanterol [facility] ELLIPTA) 62.5-25 MCG/ACT AEPB [id number] Yes Inhale 1 puff into the lungs daily. Paz, Jose E, MD  Active   vitamin C (ASCORBIC ACID) 250 MG tablet [id number]  Take 250 mg by mouth daily. [provider]  Active Self  Vitamin Mixture (VITAMIN E COMPLETE PO) [id number]  Take 1 capsule by mouth daily.  [provider]  Active               Assessment/Plan:   Medication Management: cost for Anoro will increase for 2026 due to not being on his [employer] formulary. - Reviewed his 2026 [employer] benefits. Anoro and Bevespi  are not covered by his [facility] plan. Preferred / tier 3 is Stiolto but he would have to meet a $250 deductible before cost would drop to $47 / month.  - Will complete paperwork or Anoro OR Bevespi  - they are the same type of medication so patient does not need both. Since he will have to show $600 out of pocket spend for Anoro again in 2026, will try to see if we can get Bevespi  instead. Discussed application with provider covering for Dr Amon, Padonda Webb and she agreed to sign application for Bevespi  and Rx. Printed provider portion of application and updated with Padonda's information. - Encouraged patient to reach out to [employer] to see if he can un-enroll in their program and get at least a portion of his money returned. Our office can handle paperwork and renewals going forward.  - Reminded patient he is due to see Dr Amon - plans to follow up in 2026.   Follow Up Plan: 1 month to check on patient assistance program application  Madelin Ray, PharmD Clinical Pharmacist [facility]

## 2024-06-19 MED ORDER — BEVESPI AEROSPHERE 9-4.8 MCG/ACT IN AERO: 2.0000 | INHALATION_SPRAY | Freq: Every day | RESPIRATORY_TRACT | 1 refills | Status: AC

## 2024-06-23 ENCOUNTER — Telehealth: Payer: Self-pay

## 2024-06-23 ENCOUNTER — Other Ambulatory Visit (HOSPITAL_COMMUNITY): Payer: Self-pay

## 2024-06-23 NOTE — Telephone Encounter (Signed)
 Error

## 2024-06-23 NOTE — Telephone Encounter (Signed)
 PAP: Application for Bevespi has been submitted to [employer] ([employer]), via fax

## 2024-06-28 ENCOUNTER — Other Ambulatory Visit: Payer: Self-pay | Admitting: Internal Medicine

## 2024-07-05 ENCOUNTER — Other Ambulatory Visit: Payer: Self-pay | Admitting: Internal Medicine

## 2024-07-07 ENCOUNTER — Telehealth: Payer: Self-pay | Admitting: Pharmacist

## 2024-07-07 ENCOUNTER — Other Ambulatory Visit: Payer: Medicare (Managed Care) | Admitting: Pharmacist

## 2024-07-07 NOTE — Telephone Encounter (Signed)
 Tried to call patient to update him on Bevespi  patient assistance program. We have written letter that patient does not have [facility] plan and will forward to PCP to review and sign. Will then forward to [facility] patient assistance program.

## 2024-07-07 NOTE — Progress Notes (Signed)
 "  07/07/2024 Name: William Whitaker MRN: [id number] DOB: 1960/12/02  Chief Complaint  Patient presents with   Medication Management    William Whitaker is a 64 y.o. year old male.   They were referred to the pharmacist by their PCP for assistance in managing medication access.   Received message from [facility] that patient was denied patient assistance program for Bevespi  due to having commercial / private insurance.  Patient has mentioned in past that he only will have [employer] in 2026. We had to provided a letter in 2023 or 2024 stating that he did not have a commercial [facility] plan when we assisted in applying for Bevespi  in the past.    Subjective:  Care Team: Primary Care Provider: Amon Aloysius BRAVO, MD ; Next Scheduled Visit: not currently scheduled.  Pulmonologist: Dr Terris - no appointment currently scheduled.   Medication Access/Adherence  Current Pharmacy:  [id number] - [place], [place] - [address] AT [address]OSS [address] [place] [address] Phone: [phone] Fax: [phone]  [facility] - [place], [place] - [address] [place] [address] Phone: [phone] Fax: [phone]   Patient reports affordability concerns with their medications: Yes  - patient is changing to [facility] in 2026. His son is an biomedical engineer and recommended [employer] to patient. Per Mr. Dykman he was charged $40 per month for Anora / Bevespi .  Thru the end of 2025 patient will only pay $20 for Anoro thru his 2025 [employer] plan. However Anoro is not on [employer] formulary for 2026.  Cost of Bevespi  on his [employer] plan would be $250 deductible and the monthly copay of $47.  Patient reports adherence concerns with their medications:  No      Objective:  Lab Results  Component Value Date   HGBA1C 5.5 12/14/2023    Lab Results  Component Value Date   CREATININE 0.64 (L) 12/14/2023   BUN 7 12/14/2023   NA 133 (L)  12/14/2023   K 4.4 12/14/2023   CL 96 (L) 12/14/2023   CO2 26 12/14/2023    Lab Results  Component Value Date   CHOL 147 12/14/2023   HDL 44 12/14/2023   LDLCALC 73 12/14/2023   TRIG 198 (H) 12/14/2023   CHOLHDL 3.3 12/14/2023    Current Outpatient Medications  Medication Instructions   albuterol  (VENTOLIN  HFA) 108 (90 Base) MCG/ACT inhaler 2 puffs, Inhalation, Every 6 hours PRN   ANORO ELLIPTA 62.5-25 MCG/ACT AEPB 1 puff, Inhalation, Daily, Due for appt   Blood Pressure KIT Check blood pressures once daily   Cholecalciferol (VITAMIN D3) 50 MCG (2000 UT) TABS 1 tablet, Daily   furosemide  (LASIX ) 20 mg, Oral, Daily, Needs appt   Glycopyrrolate -Formoterol (BEVESPI  AEROSPHERE) 9-4.8 MCG/ACT AERO 2 puffs, Inhalation, Daily, (Replaces Anoro)   ipratropium (ATROVENT ) 0.02 % nebulizer solution USE 1 VIAL VIA NEBULIZER 4 TIMES DAILY   Iron-Vitamins (GERITOL) LIQD 1 Dose, Daily   Magnesium  250 MG TABS 1 tablet, Daily   Multiple Vitamins-Minerals (ONE-A-DAY MENS VITACRAVES PO) 1 tablet, Daily   omeprazole  (PRILOSEC) 20 mg, Oral, Daily   potassium chloride  SA (KLOR-CON  M) 20 MEQ tablet 20 mEq, Oral, Daily   propranolol  (INDERAL ) 20 mg, Oral, 2 times daily, Needs appt   spironolactone  (ALDACTONE ) 50 mg, Oral, Daily, Needs appt   vitamin C (ASCORBIC ACID) 250 mg, Daily   Vitamin Mixture (VITAMIN E COMPLETE PO) 1 capsule,  Daily      Assessment/Plan:   Medication Management: cost for Anoro will increase for 2026 due to not being on his [employer] formulary. - Reviewed his 2026 [employer] benefits. Anoro and Bevespi  are not covered by his [facility] plan. Preferred / tier 3 is Stiolto but he would have to meet a $250 deductible before cost would drop to $47 / month.  - Wrote letter explaining that patient did not have [facility]. Will have PCP review and sign.    Follow Up Plan: 1 month to check on patient assistance program application  Madelin Ray, PharmD Clinical  Pharmacist [facility]    "

## 2024-07-15 ENCOUNTER — Other Ambulatory Visit: Payer: Self-pay | Admitting: Internal Medicine

## 2024-07-16 NOTE — Telephone Encounter (Signed)
 Spoke to representative with [employer] and patient has Nurse, Learning Disability and will need to send a letter with date of termination with previous insurance to [employer] before APP will be reviewed for approval.

## 2024-07-18 ENCOUNTER — Other Ambulatory Visit (INDEPENDENT_AMBULATORY_CARE_PROVIDER_SITE_OTHER): Payer: Medicare (Managed Care) | Admitting: Pharmacist

## 2024-07-18 NOTE — Progress Notes (Signed)
 "  07/18/2024 Name: William Whitaker MRN: [id number] DOB: 09-Dec-1960  Chief Complaint  Patient presents with   Medication Adherence   Medication Management    William Whitaker is a 64 y.o. year old male.   They were referred to the pharmacist by their PCP for assistance in managing medication access.   Coordinated with [place] [employer] [place] patient [place] program program today   Subjective:  Care Team: Primary Care Provider: Amon Aloysius BRAVO, MD ; Next Scheduled Visit: not currently scheduled.  Pulmonologist: Dr Terris - no appointment currently scheduled.   Medication Access/Adherence  Current Pharmacy:  [id number] - [place], [place] - [address] AT [address] [place] [address] Phone: [phone] Fax: [phone]  [facility] - [place], [place] - [address] [place] [address] Phone: [phone] Fax: [phone]   Patient reports affordability concerns with their medications: Yes  - patient is changing to [facility] in 2026. His son is an biomedical engineer and recommended [employer] to patient. Per Mr. Mcmichael he was charged $40 per month for Anora / Bevespi .  Thru the end of 2025 patient will only pay $20 for Anoro thru his 2025 [employer] plan. However Anoro is not on [employer] formulary for 2026.  Cost of Bevespi  on his [employer] plan would be $250 deductible and the monthly copay of $47.  Patient reports adherence concerns with their medications:  No      Objective:  Lab Results  Component Value Date   HGBA1C 5.5 12/14/2023    Lab Results  Component Value Date   CREATININE 0.64 (L) 12/14/2023   BUN 7 12/14/2023   NA 133 (L) 12/14/2023   K 4.4 12/14/2023   CL 96 (L) 12/14/2023   CO2 26 12/14/2023    Lab Results  Component Value Date   CHOL 147 12/14/2023   HDL 44 12/14/2023   LDLCALC 73 12/14/2023   TRIG 198 (H) 12/14/2023   CHOLHDL 3.3 12/14/2023    Current Outpatient Medications  Medication  Instructions   albuterol  (VENTOLIN  HFA) 108 (90 Base) MCG/ACT inhaler 2 puffs, Inhalation, Every 6 hours PRN, Needs appt   Blood Pressure KIT Check blood pressures once daily   Cholecalciferol (VITAMIN D3) 50 MCG (2000 UT) TABS 1 tablet, Daily   furosemide  (LASIX ) 20 mg, Oral, Daily, Needs appt   Glycopyrrolate -Formoterol (BEVESPI  AEROSPHERE) 9-4.8 MCG/ACT AERO 2 puffs, Inhalation, Daily, (Replaces Anoro)   ipratropium (ATROVENT ) 0.02 % nebulizer solution USE 1 VIAL VIA NEBULIZER 4 TIMES DAILY   Iron-Vitamins (GERITOL) LIQD 1 Dose, Daily   Magnesium  250 MG TABS 1 tablet, Daily   Multiple Vitamins-Minerals (ONE-A-DAY MENS VITACRAVES PO) 1 tablet, Daily   omeprazole  (PRILOSEC) 20 mg, Oral, Daily   potassium chloride  SA (KLOR-CON  M) 20 MEQ tablet 20 mEq, Oral, Daily   propranolol  (INDERAL ) 20 mg, Oral, 2 times daily, Needs appt   spironolactone  (ALDACTONE ) 50 mg, Oral, Daily, Needs appt   vitamin C (ASCORBIC ACID) 250 mg, Daily   Vitamin Mixture (VITAMIN E COMPLETE PO) 1 capsule, Daily      Assessment/Plan:   Medication Management: cost for Anoro will increase for 2026 due to not being on his [employer] formulary. - Reviewed his 2026 [employer] benefits. Anoro and Bevespi  are not covered by his [facility] plan. Preferred / tier 3 is Stiolto but he would have to meet a $250 deductible before cost would drop to $47 / month.  -  Called [place] [employer] [place] - patient was approved 07/17/2024 to receive Bevespi  inhaler thru 07/02/2025. He should received delivery in 7 to 10 business day.  - Unable to reach patient but LM on VM about Bevespi  approval. Also reminded patient he needs to call office for follow up with PCP - [phone]   Follow Up Plan: 3 to 4 months   Madelin Ray, PharmD Clinical Pharmacist  [facility]    "

## 2024-07-22 ENCOUNTER — Other Ambulatory Visit: Payer: Self-pay | Admitting: Internal Medicine

## 2024-08-06 ENCOUNTER — Telehealth: Payer: Self-pay | Admitting: *Deleted

## 2024-08-06 NOTE — Telephone Encounter (Addendum)
 Pt due for AWV and follow up with PCP. Will try to arrange for the same day if pt is agreeable. Left message to return my call. AWV must be through [place] video or in person.

## 2024-08-07 NOTE — Telephone Encounter (Signed)
 Left message for pt to return my call.

## 2024-08-08 MED ORDER — POTASSIUM CHLORIDE CRYS ER 20 MEQ PO TBCR: 20.0000 meq | EXTENDED_RELEASE_TABLET | Freq: Every day | ORAL | 0 refills | Status: AC

## 2024-08-08 MED ORDER — PROPRANOLOL HCL 20 MG PO TABS: 20.0000 mg | ORAL_TABLET | Freq: Two times a day (BID) | ORAL | 0 refills | Status: AC

## 2024-08-08 MED ORDER — FUROSEMIDE 20 MG PO TABS: 20.0000 mg | ORAL_TABLET | Freq: Every day | ORAL | 0 refills | Status: AC

## 2024-08-08 MED ORDER — SPIRONOLACTONE 50 MG PO TABS: 50.0000 mg | ORAL_TABLET | Freq: Every day | ORAL | 0 refills | Status: AC

## 2024-08-08 MED ORDER — OMEPRAZOLE 20 MG PO CPDR: 20.0000 mg | DELAYED_RELEASE_CAPSULE | Freq: Every day | ORAL | 0 refills | Status: AC

## 2024-08-08 NOTE — Telephone Encounter (Signed)
30 day supplies sent.

## 2024-08-08 NOTE — Telephone Encounter (Signed)
 Pt called in about scheduling appt and getting medication refilled. Was able to scheudle AWV and pt kept follow up appt for 08/26/2024  However still concerned about meds. Please follow up

## 2024-08-19 ENCOUNTER — Ambulatory Visit

## 2024-08-26 ENCOUNTER — Ambulatory Visit: Admitting: Internal Medicine
